# Patient Record
Sex: Female | Born: 1975 | Race: White | Hispanic: No | Marital: Married | Smoking: Former smoker
Health system: Southern US, Community
[De-identification: ages and names within clinical notes are randomized; demographics above are authoritative.]

## PROBLEM LIST (undated history)

## (undated) DIAGNOSIS — F32A Depression, unspecified: Secondary | ICD-10-CM

## (undated) DIAGNOSIS — F319 Bipolar disorder, unspecified: Secondary | ICD-10-CM

## (undated) DIAGNOSIS — B1920 Unspecified viral hepatitis C without hepatic coma: Secondary | ICD-10-CM

## (undated) DIAGNOSIS — R87619 Unspecified abnormal cytological findings in specimens from cervix uteri: Secondary | ICD-10-CM

## (undated) DIAGNOSIS — N83209 Unspecified ovarian cyst, unspecified side: Secondary | ICD-10-CM

## (undated) DIAGNOSIS — R569 Unspecified convulsions: Secondary | ICD-10-CM

## (undated) DIAGNOSIS — F419 Anxiety disorder, unspecified: Secondary | ICD-10-CM

## (undated) DIAGNOSIS — E0590 Thyrotoxicosis, unspecified without thyrotoxic crisis or storm: Secondary | ICD-10-CM

## (undated) DIAGNOSIS — J45909 Unspecified asthma, uncomplicated: Secondary | ICD-10-CM

## (undated) DIAGNOSIS — IMO0002 Reserved for concepts with insufficient information to code with codable children: Secondary | ICD-10-CM

## (undated) DIAGNOSIS — F329 Major depressive disorder, single episode, unspecified: Secondary | ICD-10-CM

## (undated) HISTORY — DX: Thyrotoxicosis, unspecified without thyrotoxic crisis or storm: E05.90

## (undated) HISTORY — DX: Reserved for concepts with insufficient information to code with codable children: IMO0002

## (undated) HISTORY — DX: Unspecified abnormal cytological findings in specimens from cervix uteri: R87.619

## (undated) HISTORY — DX: Unspecified asthma, uncomplicated: J45.909

## (undated) HISTORY — DX: Unspecified convulsions: R56.9

## (undated) HISTORY — PX: APPENDECTOMY: SHX54

## (undated) HISTORY — DX: Unspecified ovarian cyst, unspecified side: N83.209

## (undated) HISTORY — PX: ABDOMINAL SURGERY: SHX537

## (undated) HISTORY — PX: TUBAL LIGATION: SHX77

---

## 2001-04-07 ENCOUNTER — Emergency Department (HOSPITAL_COMMUNITY): Admission: EM | Admit: 2001-04-07 | Discharge: 2001-04-07 | Payer: Self-pay | Admitting: Internal Medicine

## 2002-03-16 ENCOUNTER — Emergency Department (HOSPITAL_COMMUNITY): Admission: EM | Admit: 2002-03-16 | Discharge: 2002-03-16 | Payer: Self-pay | Admitting: *Deleted

## 2002-03-16 ENCOUNTER — Encounter: Payer: Self-pay | Admitting: *Deleted

## 2003-02-13 ENCOUNTER — Emergency Department (HOSPITAL_COMMUNITY): Admission: EM | Admit: 2003-02-13 | Discharge: 2003-02-13 | Payer: Self-pay | Admitting: Emergency Medicine

## 2003-04-20 ENCOUNTER — Emergency Department (HOSPITAL_COMMUNITY): Admission: EM | Admit: 2003-04-20 | Discharge: 2003-04-20 | Payer: Self-pay | Admitting: Emergency Medicine

## 2003-04-22 ENCOUNTER — Inpatient Hospital Stay (HOSPITAL_COMMUNITY): Admission: EM | Admit: 2003-04-22 | Discharge: 2003-04-24 | Payer: Self-pay | Admitting: *Deleted

## 2003-10-05 ENCOUNTER — Emergency Department (HOSPITAL_COMMUNITY): Admission: EM | Admit: 2003-10-05 | Discharge: 2003-10-05 | Payer: Self-pay | Admitting: Emergency Medicine

## 2003-10-23 ENCOUNTER — Emergency Department (HOSPITAL_COMMUNITY): Admission: EM | Admit: 2003-10-23 | Discharge: 2003-10-23 | Payer: Self-pay

## 2003-11-07 ENCOUNTER — Emergency Department (HOSPITAL_COMMUNITY): Admission: EM | Admit: 2003-11-07 | Discharge: 2003-11-07 | Payer: Self-pay | Admitting: Emergency Medicine

## 2004-04-10 ENCOUNTER — Emergency Department (HOSPITAL_COMMUNITY): Admission: EM | Admit: 2004-04-10 | Discharge: 2004-04-10 | Payer: Self-pay | Admitting: Emergency Medicine

## 2004-10-30 ENCOUNTER — Emergency Department (HOSPITAL_COMMUNITY): Admission: EM | Admit: 2004-10-30 | Discharge: 2004-10-30 | Payer: Self-pay | Admitting: Emergency Medicine

## 2006-03-27 ENCOUNTER — Emergency Department (HOSPITAL_COMMUNITY): Admission: EM | Admit: 2006-03-27 | Discharge: 2006-03-27 | Payer: Self-pay | Admitting: Emergency Medicine

## 2006-07-30 ENCOUNTER — Emergency Department (HOSPITAL_COMMUNITY): Admission: EM | Admit: 2006-07-30 | Discharge: 2006-07-30 | Payer: Self-pay | Admitting: Emergency Medicine

## 2006-09-12 ENCOUNTER — Emergency Department (HOSPITAL_COMMUNITY): Admission: EM | Admit: 2006-09-12 | Discharge: 2006-09-12 | Payer: Self-pay | Admitting: Emergency Medicine

## 2006-10-25 ENCOUNTER — Emergency Department (HOSPITAL_COMMUNITY): Admission: EM | Admit: 2006-10-25 | Discharge: 2006-10-25 | Payer: Self-pay | Admitting: Emergency Medicine

## 2007-01-14 ENCOUNTER — Emergency Department (HOSPITAL_COMMUNITY): Admission: EM | Admit: 2007-01-14 | Discharge: 2007-01-14 | Payer: Self-pay | Admitting: Emergency Medicine

## 2007-11-08 ENCOUNTER — Emergency Department (HOSPITAL_COMMUNITY): Admission: EM | Admit: 2007-11-08 | Discharge: 2007-11-08 | Payer: Self-pay | Admitting: Emergency Medicine

## 2008-05-23 ENCOUNTER — Ambulatory Visit: Payer: Self-pay | Admitting: Psychiatry

## 2008-05-23 ENCOUNTER — Emergency Department (HOSPITAL_COMMUNITY): Admission: EM | Admit: 2008-05-23 | Discharge: 2008-05-24 | Payer: Self-pay | Admitting: Emergency Medicine

## 2008-05-24 ENCOUNTER — Inpatient Hospital Stay (HOSPITAL_COMMUNITY): Admission: RE | Admit: 2008-05-24 | Discharge: 2008-05-29 | Payer: Self-pay | Admitting: Psychiatry

## 2010-06-27 LAB — DIFFERENTIAL
Basophils Absolute: 0 10*3/uL (ref 0.0–0.1)
Eosinophils Relative: 3 % (ref 0–5)
Lymphocytes Relative: 33 % (ref 12–46)
Lymphs Abs: 3.2 10*3/uL (ref 0.7–4.0)
Monocytes Absolute: 0.4 10*3/uL (ref 0.1–1.0)
Neutro Abs: 6 10*3/uL (ref 1.7–7.7)

## 2010-06-27 LAB — CBC
HCT: 41.7 % (ref 36.0–46.0)
Hemoglobin: 14.4 g/dL (ref 12.0–15.0)
RBC: 4.3 MIL/uL (ref 3.87–5.11)
RDW: 13 % (ref 11.5–15.5)
WBC: 9.9 10*3/uL (ref 4.0–10.5)

## 2010-06-27 LAB — RAPID URINE DRUG SCREEN, HOSP PERFORMED
Amphetamines: NOT DETECTED
Barbiturates: NOT DETECTED
Benzodiazepines: POSITIVE — AB
Cocaine: NOT DETECTED

## 2010-06-27 LAB — BASIC METABOLIC PANEL
CO2: 28 mEq/L (ref 19–32)
Calcium: 8.9 mg/dL (ref 8.4–10.5)
Creatinine, Ser: 0.82 mg/dL (ref 0.4–1.2)
GFR calc Af Amer: >60 mL/min (ref >60)
GFR calc non Af Amer: >60 mL/min (ref >60)
Glucose, Bld: 134 mg/dL — ABNORMAL HIGH (ref 70–99)

## 2010-06-27 LAB — ETHANOL: Alcohol, Ethyl (B): <5 mg/dL (ref 0–10)

## 2010-07-30 NOTE — H&P (Signed)
NAME:  Barbara Simon, Barbara Simon                ACCOUNT NO.:  [id number]   MEDICAL RECORD NO.:  [id number]          PATIENT TYPE:  IPS   LOCATION:  0503                          FACILITY:  [facility]   PHYSICIAN:  Geoffery Lyons, M.D.      DATE OF BIRTH:  [birth year]   DATE OF ADMISSION:  05/24/2008  DATE OF DISCHARGE:                       PSYCHIATRIC ADMISSION ASSESSMENT   HISTORY:  A 35 year old single female voluntarily admitted on May 23, 2008.  The patient states that she is here to get her mind straight,  having problems and anxiety attacks, depression and suicidal thoughts  with a plan to overdose.  Feels that she is a burden to her family.  She  is unemployed.  Has been off her medications for some period time and is  here to get some help.   PAST PSYCHIATRIC HISTORY:  The patient was here in 2005 for a self-  inflicted injury.  She has a history bipolar disorder.   SOCIAL HISTORY:  A 35 year old female.  She lives in [place]  and lives with her mother.  She does have 3 children, but she states  they do not reside with her.  She did not disclose any other further  details.  She is unemployed.  Per chart records, states the patient is  on probation for breaking and entering charges.   FAMILY HISTORY:  None known.   ALCOHOL/DRUG HISTORY:  The patient reports that she has a history of  being addicted to opiates.  Has been clean from opiates for 6 months.  Does state though that she has been buying Xanax off the street to cope  with her anxiety.   PRIMARY CARE Odus Clasby:  None.   MEDICAL PROBLEMS:  Hypertension.   MEDICATIONS:  1. Cymbalta 60 mg daily.  2. Abilify 5 mg daily.  3. Tramadol p.r.n. for back pain.  4. Lyrica, unclear dose.  5. Xanax.  Again, patient has been noncompliant with her medications.   DRUG ALLERGIES:  NEURONTIN, but states she had a problem where she was  bleeding rectally.   PHYSICAL EXAMINATION:  GENERAL:  This is an overweight young female who  was  fully assessed at [facility].  Physical exam was reviewed.  No significant findings.  She did receive Toradol 60 and a nicotine  patch.  VITAL SIGNS:  Temperature of 97.3, 100 heart rate, 18 respirations,  blood pressure is 124/69, 171 pounds, approximately 5 feet 6 inches  tall.   LABORATORY DATA:  Alcohol level less than 5.  CBC within normal limits.  Urine drug screen is positive for benzodiazepines.   MENTAL STATUS EXAM:  The patient is fully alert.  When asked to  interview, she states does she have a choice about speaking with me.  She did cooperate.  Laid down in her bed.  No eye contact.  She is  casually dressed.  Her speech is clear, normal pace and tone.  She is  irritable, unhappy with her psychiatrist.  She is wanting to get Xanax,  she states that is the only thing that helps her out.  Thought  processes  are coherent and goal directed.  No evidence of any delusional  statements.  Does not appear to be responding to internal stimuli.  Cognitive function intact.  Her memory appears good.  Judgment and  insight are fair.   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  AXIS II:  Deferred.  AXIS III:  Back pain.  AXIS IV:  Problems with occupation, housing issues, other psychosocial  problems.  AXIS V:  Current is 35-40.   PLAN:  Our plan is to resume her Cymbalta.  Have Seroquel available on a  p.r.n. basis as well as Vistaril for anxiety.  The patient will be in  the dual diagnosis program.  We will reinforce medication compliance,  followup and coping skills.  We offered a family session.  Her tentative  length of stay at this time is 3-4 days.      Landry Corporal, N.P.      Geoffery Lyons, M.D.  Electronically Signed    JO/MEDQ  D:  05/25/2008  T:  05/25/2008  Job:  [id number]

## 2010-08-02 NOTE — Discharge Summary (Signed)
NAME:  Barbara Simon, Barbara Simon                ACCOUNT NO.:  [id number]   MEDICAL RECORD NO.:  [id number]          PATIENT TYPE:  IPS   LOCATION:  0503                          FACILITY:  [facility]   PHYSICIAN:  Geoffery Lyons, M.D.      DATE OF BIRTH:  [birth year]   DATE OF ADMISSION:  05/24/2008  DATE OF DISCHARGE:  05/29/2008                               DISCHARGE SUMMARY   CHIEF COMPLAINT AND HISTORY OF PRESENT ILLNESS:  This was the second  admission to [facility] for this 35 year old  single female voluntarily admitted March 9.  She endorsed that she was  in the unit to get her mind straight, having problems with anxiety  attacks, depression, thoughts of suicide with a plan to overdose.  Feeling that she was a burden to her family.  She is unemployed.  Has  been off her medication for some period of time and was here to get some  help.  She lost her job in December of 2009.  Unable to find a job.  Living with mom.  Feels like she is a burden.  She went to [facility] and  voiced some thoughts of suicide and she was sent to the emergency room.   PAST PSYCHIATRIC HISTORY:  Has been diagnosed with bipolar.  Has not  taken medications since November of 2008.  She was admitted to  [facility] in 2005 for self-inflicted injury.   ALCOHOL AND DRUG HISTORY:  Endorsed a history of being addicted to  opiates.  Has been abstinent for 6 months.  Has been buying Xanax off  the street to cope with her anxiety as she claims.   MEDICAL HISTORY:  Hypertension.   MEDICATIONS:  1. Cymbalta 60 mg per day.  2. Abilify 5 mg per day.  3. Tramadol as needed for back pain.  4. Lyrica unclear dose.  5. Xanax.  Noncompliant with medications.  Hard to know how long she      had been without them.   PHYSICAL EXAMINATION:  Failed to show any acute findings.   LABORATORY WORKUP:  Alcohol level less than 5.  CBC within normal  limits.  UDS positive for benzodiazepines.  Exam reveals an  alert,  cooperative female in bed.  No eye contact.  Speech was clear, normal in  rate, tempo and production.  Irritable, unhappy with her psychiatrist.  Wanting to get Xanax only thing that helps me.  Thought processes  logical, coherent and relevant.  No evidence of delusions.  Claims  thoughts of suicide.  No active plan.  No intent.  Cognition well-  preserved.   DIAGNOSES:  AXIS I:  Mood disorder not otherwise specified.  Benzodiazepine abuse.  AXIS II:  No diagnosis.  AXIS III:  Back pain.  AXIS IV:  Moderate.  AXIS V:  Upon admission 35, highest GAF in the last year 60.   COURSE IN THE HOSPITAL:  She was admitted, started in individual and  group psychotherapy.  Claims she was crazy in her mind, cannot take it  anymore, life in general, cannot be  happy, decreased energy, decreased  motivation.  Only things about all the things that are ruining her life,  worries, ruminates, catastrophizes.  On probation for breaking and  entering in 2008.  Diagnosed bipolar, depressed, also with anxiety.  Has  three children.  Never good at dealing with life other than when she was  35 years old got negative attention, trouble in school, fighting all the  time.  Had been on Paxil and Wellbutrin.  About 5-1/2 years ago became  addicted to opiates, was taking Xanax 1 mg four times a day.  Claims she  was __________ ill to depressed.  Had been on Paxil, Wellbutrin,  Cymbalta, Abilify, Lyrica, tramadol and Seroquel.  Very upset because  she wanted Xanax and we explained why it was not a good idea.  She was  given some Ativan as needed short-term.  She was placed on Seroquel,  Cymbalta and Lunesta.  The only thing that helps her sleep is Lunesta.  We increased Cymbalta to 60.  We increased the Seroquel.  March 12 more  settled.  Admits that her life feels out of control.  Admits she knows  she needs to start from scratch but states she really does not know how  to do it and feeling very overwhelmed.   Endorsed that just filling  application out for work she would become shaky, feeling out of control  with increased heart rate.  March 13 she was pretty down as the sister  did not come after she said she was coming.  Dealing with the stressors.  Feeling alone.  She asked for Abilify but she was told that if she was  on Abilify she could not be on the Seroquel and liked the Seroquel  better.  March 15 she felt ready to be discharged.  Endorsed no active  suicidal or homicidal ideas.  No hallucinations or delusions.  Feeling  better.  She understood that she really needed to follow up on an  outpatient basis, to continue to work on the issues in order to identify  a lot of her negative thinking process and start addressing some of the  distortions with CBT techniques.  She was not suicidal or homicidal and  felt that she could continue her medication this time around and do  better.   DISCHARGE DIAGNOSES:  AXIS I:  Mood disorder not otherwise specified,  benzodiazepine abuse, opiate dependence in remission, cocaine abuse in  remission.  AXIS II:  No diagnosis.  AXIS III:  No diagnosis.  AXIS IV:  Moderate.  AXIS V:  On discharge 50-55.   DISCHARGE MEDICATIONS:  1. Discharged on Cymbalta 60 mg per day.  2. Seroquel 300 one-half four times a day.  3. Lunesta 30 mg at bedtime.   FOLLOWUP:  Follow up at [facility].      Geoffery Lyons, M.D.  Electronically Signed     IL/MEDQ  D:  06/21/2008  T:  06/21/2008  Job:  [id number]

## 2010-08-02 NOTE — H&P (Signed)
NAME:  Barbara Simon, Barbara Simon                            ACCOUNT NO.:  [id number]   MEDICAL RECORD NO.:  [id number]                   PATIENT TYPE:  IPS   LOCATION:  0301                                 FACILITY:  [facility]   PHYSICIAN:  Vic Ripper, P.A.-C.         DATE OF BIRTH:  1975-06-29   DATE OF ADMISSION:  04/22/2003  DATE OF DISCHARGE:                         PSYCHIATRIC ADMISSION ASSESSMENT   IDENTIFYING INFORMATION:  This is an involuntary admission.  This is a 35-  year-old single white female.  She is admitted to the services of Dr.  Milford Cage.  Identifying information comes from the patient and  accompanying records.  Apparently the patient cut on her wrist Wednesday.  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  Her mother finally tracked her down, brought her to the emergency room for  medical clearance.  The patient continued to endorse suicidal ideation with  no safety plan and hence it was felt that she needed to be committed.  The  patient is worried about her 3 children.  She says her mother has to work 2  jobs as an Lobbyist to support her 16 year old sister.  The  patient is currently homeless.  She has been staying in a hotel with her 3  children ages 55, 33 and 1-1/2. She is currently having relationship issues  with her current boyfriend who is the father of her youngest child.  She has  had no prior issues with the law.  In the emergency room, her CBC, her  urinalysis were free for infection or immediate issues.  Her urine drug  screen however was positive for cocaine.   The patient states that recently she has been having problems sleeping.  She  has no energy, a decreased appetite.  She may have lost some weight  recently, although it is not apparent from her physical examination, and she  is very irritable.   PAST PSYCHIATRIC HISTORY:  Ten years ago she was seen at [facility].  She does not  remember at this time what was going on then  and she cannot remember if in fact she was prescribed medication at that  time or not.   SOCIAL HISTORY:  She does have  GED.  She married at age 58.  She was  divorced at age 89.  She has 3 children, her oldest, an 19-year-old, is a  girl, her middle child is her son.  He is 6.  Those 2 children have the same  father, that is who she was married to, and her youngest son, age 42-1/2, was  fathered by her current boyfriend.   FAMILY HISTORY:  She states her mom has depression.  She is not sure whether  her mom is still prescribed medication or not..   ALCOHOL AND DRUG ABUSE:  She states she smokes 1 to 1-1/2 packs a  day for  the past 12 years.  She denies alcohol.  Cocaine is relatively new and she  has only used a few times.   PAST MEDICAL HISTORY:  Primary care Karagan Lehr:  She states she does not have  one.  She has no known medical illness.  Current medications are none.   ALLERGIES:  None.   POSITIVE PHYSICAL FINDINGS:  PHYSICAL EXAMINATION:  Reveals a short,  slightly overweight white female who is quite irritable, with gunky eyes.  HEENT:  Reveals that she still has her contacts in.  Her eyes are matted.  She states that this is normal for her, that it is not indicative of an  infection or anything.  Remainder of her HEENT exam was within normal  limits.  Interestingly enough, her tongue is pierced.  LUNGS:  Clear to auscultation.  HEART:  Regular rate and rhythm without murmurs, rubs or gallops.  ABDOMEN:  Soft, it revealed no tenderness to palpation, no organomegaly.  Bowel sounds were positive and present in all 4 quadrants.  She does have a  scar status post appendectomy.  MUSCULOSKELETAL EXAM: Reveals no clubbing, cyanosis, or edema.  NEUROLOGICALLY:  Cranial nerves II-XII are grossly intact.   MENTAL STATUS EXAM:  She is alert albeit irritable.  Her appearance is  somewhat disheveled.  She does have superficial, self-inflicted  lacerations  on her right arm.  Her behavior becomes cooperative.  Her speech is not  pressured.  There is no psychotic content.  Her mood is somewhat depressed  and anxious.  Her affect is irritable.  Her thought processes reveal no  delusions or paranoia.  Concentration and memory are intact.  Judgment and  insight are fair.  Intelligence is average.   ADMISSION DIAGNOSES:   AXIS I:  1. Adjustment disorder with depressed mood secondary to situation.  2. Cocaine abuse.  3. Tobacco abuse.   AXIS II:  Rule out personality disorder.   AXIS III:  No known medical problems.   AXIS IV:  Severe, problems with primary support group, problems related to  social environment, occupational problems, housing problem, economic  problem, and problems related to the legal system.  She cannot seem to get  child support from her older 2 children's father.   AXIS V:  Global assessment of function is 40.   PLAN:  Admit for crisis stabilization and support through acute withdrawal  from cocaine, to start an antidepressant, and to have case management look  into her situation to see if there is any social support available out in  the community.                                               Vic Ripper, P.A.-C.    MD/MEDQ  D:  04/22/2003  T:  04/22/2003  Job:  [id number]

## 2010-08-02 NOTE — Discharge Summary (Signed)
NAME:  Barbara Simon, Barbara Simon NO.:  [id number]   MEDICAL RECORD NO.:  [id number]                   PATIENT TYPE:  IPS   LOCATION:  0301                                 FACILITY:  [facility]   PHYSICIAN:  Jeanice Lim, M.D.              DATE OF BIRTH:  06-01-1975   DATE OF ADMISSION:  04/22/2003  DATE OF DISCHARGE:  04/24/2003                                 DISCHARGE SUMMARY   IDENTIFYING DATA:  This is a 35 year old single Caucasian female admitted to  services of Jasmine Pang, M.D. 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  Mother finally  tracked her down, brought her to the emergency room for medical clearance.  The patient continued to endorse suicide ideation with no plan.  The patient  was worried about her 3 children and did not want to come in voluntarily.  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  The patient was currently homeless.  Urine  drug screen was positive for cocaine.  The patient was quite irritable.   MEDICATIONS:  None.   ALLERGIES:  None.   PHYSICAL EXAMINATION:  Within normal limits.  NEURO:  Nonfocal.   ROUTINE ADMITTED LABORATORY STUDIES:  Essentially within normal limits.   The patient's cocaine use apparently had been relatively recent, and she had  used just a few times, but apparently was out of control already.   MENTAL STATUS EXAM:  Alert, but irritable, somewhat disheveled, superficial  self-inflicted laceration to right arm, cooperative.  Speech not pressured.  No psychotic symptoms.  Mood somewhat depressed, anxious, and angry about  being hospitalized.  Thought processes goal-directed.  There was no  psychotic symptoms or acute dangerous ideation.  Cognition intact.  Judgment  and insight fair to poor.   ADMISSION DIAGNOSIS:   AXIS I:  1. Adjustment disorder with depressed mood, secondary to  situation.  2. Rule out a substance induced mood disorder.  3. Rule out cocaine abuse.  4. Tobacco abuse.   AXIS II:  Rule out personality disorder.   AXIS III:  None.   AXIS IV:  Severe problems related to primary support group, which is  limited.  Social, environment, occupational problems, housing problems, and  economic problems, and problems related to the legal system.   AXIS V:  35/65.   The patient was admitted, ordered routine and p.r.n. medications, underwent  further monitoring, was encouraged to participate in individual, group, and  milieu therapies.  The patient received crisis intervention regarding  suicide ideation with safety monitoring.  Participated in substance abuse  treatment and was monitored regarding withdrawal symptoms.  The patient  required Seroquel to restore sleep.  The patient notified DSS social worker  of a followup plan and was willing to participate in a  substance abuse  treatment program and follow up with a psychiatrist and therapist.  The  patient was appropriate on the unit and had no dangerous ideation.  Mood  improved.  Reported regarding cutting on her wrist.  Reported having only  used cocaine a couple of times.  The patient participated in aftercare  planning and was discharged in improved condition.  No dangerous ideation or  psychotic symptoms.  Motivated to receive substance abuse followup.  Discharged after medication education on:  1. Wellbutrin XL 300 mg q. a.m.  2. Seroquel 25 mg 1-4 at 9:00 p.r.n. insomnia.   The patient is to follow up at [facility]  Thursday, April 27, 2003.   DISCHARGE DIAGNOSES:   AXIS I:  1. Adjustment disorder with depressed mood, secondary to situation.  2. Rule out a substance induced mood disorder.  3. Rule out cocaine abuse.  4. Tobacco abuse.   AXIS II:  Rule out personality disorder.   AXIS III:  None.   AXIS IV:  Severe problems related to primary support  group, which is  limited.  Social, environment, occupational problems, housing problems, and  economic problems, and problems related to the legal system.   AXIS V:  Global assessment of functioning on discharge 55-60.                                               Jeanice Lim, M.D.    Barbara Simon  D:  05/14/2003  T:  05/15/2003  Job:  [id number]

## 2011-09-02 ENCOUNTER — Encounter (HOSPITAL_COMMUNITY): Payer: Self-pay | Admitting: Emergency Medicine

## 2011-09-02 ENCOUNTER — Emergency Department (HOSPITAL_COMMUNITY)
Admission: EM | Admit: 2011-09-02 | Discharge: 2011-09-03 | Disposition: A | Discharge status: Home / Self Care | Payer: Self-pay | Attending: Emergency Medicine | Admitting: Emergency Medicine

## 2011-09-02 DIAGNOSIS — F17200 Nicotine dependence, unspecified, uncomplicated: Secondary | ICD-10-CM | POA: Insufficient documentation

## 2011-09-02 DIAGNOSIS — M5430 Sciatica, unspecified side: Secondary | ICD-10-CM | POA: Insufficient documentation

## 2011-09-02 HISTORY — DX: Depression, unspecified: F32.A

## 2011-09-02 HISTORY — DX: Bipolar disorder, unspecified: F31.9

## 2011-09-02 HISTORY — DX: Anxiety disorder, unspecified: F41.9

## 2011-09-02 HISTORY — DX: Major depressive disorder, single episode, unspecified: F32.9

## 2011-09-02 MED ORDER — ONDANSETRON 8 MG PO TBDP
8.0000 mg | ORAL_TABLET | Freq: Once | ORAL | Status: AC
  Administered 2011-09-03: 8 mg via ORAL
  Filled 2011-09-02: qty 1

## 2011-09-02 MED ORDER — HYDROMORPHONE HCL PF 1 MG/ML IJ SOLN
1.0000 mg | Freq: Once | INTRAMUSCULAR | Status: AC
  Administered 2011-09-03: 1 mg via INTRAMUSCULAR
  Filled 2011-09-02: qty 1

## 2011-09-02 NOTE — ED Notes (Signed)
Patient complaining of right-sided lower back pain radiating into right hip with right leg numbness. Denies back injury or history.

## 2011-09-03 ENCOUNTER — Emergency Department (HOSPITAL_COMMUNITY): Payer: Self-pay

## 2011-09-03 MED ORDER — OXYCODONE-ACETAMINOPHEN 5-325 MG PO TABS: 1.0000 | ORAL_TABLET | ORAL | Status: AC | PRN

## 2011-09-03 MED ORDER — CYCLOBENZAPRINE HCL 10 MG PO TABS: 10.0000 mg | ORAL_TABLET | Freq: Three times a day (TID) | ORAL | Status: AC | PRN

## 2011-09-03 MED ORDER — OXYCODONE-ACETAMINOPHEN 5-325 MG PO TABS
1.0000 | ORAL_TABLET | Freq: Once | ORAL | Status: AC
  Administered 2011-09-03: 1 via ORAL
  Filled 2011-09-03: qty 1

## 2011-09-03 MED ORDER — PREDNISONE 10 MG PO TABS: ORAL_TABLET | ORAL | Status: DC

## 2011-09-03 NOTE — Discharge Instructions (Signed)
Sciatica  Sciatica is a condition often seen in patients with disk disease of the lower back. Pressure on the sciatica nerve causes pain to radiate from the lower back or buttock down the leg.   CAUSES   It results from pressure on nerve roots coming out of the spine. This is often the result of a disc that deteriorates and pushes to one side. Often there is a history of back problems.   TREATMENT   In most cases sciatica improves greatly with conservative treatment (treatment that does not involve surgery). Most patients are completely better after 2-4 weeks of bed rest and other supportive care. Bed rest reduces the disc pressure greatly. Sitting is the worst position. When sitting pressure on the disc is over 5 times greater than it is while lying down. Avoid:   Bending.   Lifting.   All other activities which make the problem worse.  After the pain improves, you may continue with normal activity. Take brief periods for bed rest throughout the day until you are back to normal.  Only take over-the-counter or prescription medicines for pain, discomfort, or fever as directed by your caregiver. Muscle relaxants may help by relieving spasm and providing mild sedation. Cold or heat therapy and massage may also give significant relief. Spinal manipulation is not recommended because it can increase the degree of disc protrusion. Traction can be used in severe cases. Surgery is reserved for patients who:   Do not improve within the first months of conservative treatment.   Have signs of severe nerve root pressure.  See your doctor for follow up care as recommended. A program for back injury rehabilitation with stretching and strengthening exercises is an important part of healing.   SEEK MEDICAL CARE IF:    You notice increased pain.   You notice weakness.   You have numbness in your legs.   You have any difficulty with bladder or bowel control.  Document Released: 04/10/2004 Document Revised: 02/20/2011 Document  Reviewed: 03/03/2005  [employer] Patient Information 2012 [employer].

## 2011-09-07 NOTE — ED Provider Notes (Signed)
History     CSN: [id number]  Arrival date & time 09/02/11  2016   First MD Initiated Contact with Patient 09/02/11 2313      Chief Complaint  Patient presents with  . Back Pain    (Consider location/radiation/quality/duration/timing/severity/associated sxs/prior treatment) HPI Comments: Patient c/o low back pain for several days.  States that she also has intermittent "numbness and tingling" sensations that radiate to her right thigh and knee.  She states this resolves with position change.  She denies incontinence, abd pain, perineal numbness or urinary symptoms.    Patient is a 36 y.o. female presenting with back pain. The history is provided by the patient.  Back Pain  This is a new problem. The current episode started more than 2 days ago. Episode frequency: intrmittently. The problem has been gradually worsening. The pain is associated with no known injury. The pain is present in the lumbar spine and sacro-iliac joint. The quality of the pain is described as aching and burning. The pain radiates to the right thigh and right knee. The pain is moderate. The symptoms are aggravated by twisting, bending and certain positions. Associated symptoms include numbness, leg pain and tingling. Pertinent negatives include no chest pain, no fever, no abdominal pain, no abdominal swelling, no bowel incontinence, no perianal numbness, no bladder incontinence, no dysuria, no pelvic pain, no paresthesias, no paresis and no weakness. She has tried nothing for the symptoms. The treatment provided no relief.    Past Medical History  Diagnosis Date  . Depression   . Bipolar 1 disorder   . Anxiety     Past Surgical History  Procedure Date  . Tubal ligation     History reviewed. No pertinent family history.  History  Substance Use Topics  . Smoking status: Current Everyday Smoker -- 1.0 packs/day  . Smokeless tobacco: Not on file  . Alcohol Use: No    OB History    Grav Para Term Preterm  Abortions TAB SAB Ect Mult Living                  Review of Systems  Constitutional: Negative for fever.  Respiratory: Negative for shortness of breath.   Cardiovascular: Negative for chest pain.  Gastrointestinal: Negative for vomiting, abdominal pain, constipation and bowel incontinence.  Genitourinary: Negative for bladder incontinence, dysuria, hematuria, flank pain, decreased urine volume, difficulty urinating and pelvic pain.       Perineal numbness or incontinence of urine or feces  Musculoskeletal: Positive for back pain. Negative for joint swelling.  Skin: Negative for rash.  Neurological: Positive for tingling and numbness. Negative for weakness and paresthesias.  All other systems reviewed and are negative.    Allergies  Review of patient's allergies indicates no known allergies.  Home Medications   Current Outpatient Rx  Name Route Sig Dispense Refill  . CYCLOBENZAPRINE HCL 10 MG PO TABS Oral Take 1 tablet (10 mg total) by mouth 3 (three) times daily as needed for muscle spasms. 21 tablet 0  . OXYCODONE-ACETAMINOPHEN 5-325 MG PO TABS Oral Take 1 tablet by mouth every 4 (four) hours as needed for pain. 20 tablet 0  . PREDNISONE 10 MG PO TABS  Take 6 tablets day one, 5 tablets day two, 4 tablets day three, 3 tablets day four, 2 tablets day five, then 1 tablet day six 21 tablet 0    BP 128/84  Pulse 72  Temp 97.9 F (36.6 C) (Oral)  Resp 16  Ht 5\' 2"  (  1.575 m)  Wt 215 lb (97.523 kg)  BMI 39.32 kg/m2  SpO2 100%  LMP 08/02/2011  Physical Exam  Nursing note and vitals reviewed. Constitutional: She is oriented to person, place, and time. She appears well-developed and well-nourished. No distress.  HENT:  Head: Normocephalic and atraumatic.  Neck: Normal range of motion. Neck supple.  Cardiovascular: Normal rate, regular rhythm and intact distal pulses.   No murmur heard. Pulmonary/Chest: Effort normal and breath sounds normal.  Musculoskeletal: She exhibits  tenderness. She exhibits no edema.       Lumbar back: She exhibits tenderness and pain. She exhibits normal range of motion, no swelling, no deformity, no laceration and normal pulse.       Back:  Neurological: She is alert and oriented to person, place, and time. No cranial nerve deficit or sensory deficit. She exhibits normal muscle tone. Coordination and gait normal.  Reflex Scores:      Patellar reflexes are 2+ on the right side and 2+ on the left side.      Achilles reflexes are 2+ on the right side and 2+ on the left side. Skin: Skin is warm and dry.    ED Course  Procedures (including critical care time)  Results for orders placed during the hospital encounter of 09/02/11  POCT PREGNANCY, URINE      Component Value Range   Preg Test, Ur NEGATIVE  NEGATIVE     Dg Lumbar Spine Complete  09/03/2011  *RADIOLOGY REPORT*  Clinical Data: Low back pain radiating to right hip.  Right leg numbness.  LUMBAR SPINE - COMPLETE 4+ VIEW  Comparison:  None.  Findings:  There is no evidence of lumbar spine fracture. Alignment is normal.  Intervertebral disc spaces are maintained.  IMPRESSION: Negative.  Original Report Authenticated By: Danae Orleans, M.D.      1. Sciatica       MDM      Patient has ttp of the lumbar paraspinal muscles and right SI joint space..  No focal neuro deficits on exam.  Ambulates with a steady gait.      Pt agrees to close f/u with her PMD  Patient / Family / Caregiver understand and agree with initial ED impression and plan with expectations set for ED visit. Pt stable in ED with no significant deterioration in condition. Pt feels improved after observation and/or treatment in ED.    Prescribed:  Flexeril Prednisone taper Percocet #20  Amulya Quintin L. [place], [place] 09/07/11 1921

## 2011-09-08 NOTE — ED Provider Notes (Signed)
Medical screening examination/treatment/procedure(s) were performed by non-physician practitioner and as supervising physician I was immediately available for consultation/collaboration.   Tashena Ibach, MD 09/08/11 1453 

## 2011-10-22 ENCOUNTER — Encounter (HOSPITAL_COMMUNITY): Payer: Self-pay | Admitting: *Deleted

## 2011-10-22 ENCOUNTER — Emergency Department (HOSPITAL_COMMUNITY)
Admission: EM | Admit: 2011-10-22 | Discharge: 2011-10-22 | Disposition: A | Discharge status: Home / Self Care | Payer: Self-pay | Attending: Emergency Medicine | Admitting: Emergency Medicine

## 2011-10-22 DIAGNOSIS — F411 Generalized anxiety disorder: Secondary | ICD-10-CM | POA: Insufficient documentation

## 2011-10-22 DIAGNOSIS — M5430 Sciatica, unspecified side: Secondary | ICD-10-CM | POA: Insufficient documentation

## 2011-10-22 DIAGNOSIS — F17200 Nicotine dependence, unspecified, uncomplicated: Secondary | ICD-10-CM | POA: Insufficient documentation

## 2011-10-22 DIAGNOSIS — F319 Bipolar disorder, unspecified: Secondary | ICD-10-CM | POA: Insufficient documentation

## 2011-10-22 MED ORDER — DEXAMETHASONE 4 MG PO TABS: ORAL_TABLET | ORAL | Status: DC

## 2011-10-22 MED ORDER — HYDROCODONE-ACETAMINOPHEN 5-325 MG PO TABS: ORAL_TABLET | ORAL | Status: DC

## 2011-10-22 MED ORDER — BACLOFEN 10 MG PO TABS: 10.0000 mg | ORAL_TABLET | Freq: Three times a day (TID) | ORAL | Status: AC

## 2011-10-22 NOTE — ED Notes (Signed)
Pt requesting medication before she is discharged.

## 2011-10-22 NOTE — ED Notes (Signed)
Discussed medications with pt and encouraged her not to take so many Goody Powders. Pt warned of the danger of stomach erosion and bleeding if she continues to take so many Goodys. Pt instructed not to use the powders while taking the steroid medications.

## 2011-10-22 NOTE — ED Notes (Signed)
Pt states lower back pain with shooting pain down right leg. Previously dx with sciatica. NAD.

## 2011-10-22 NOTE — ED Provider Notes (Signed)
History     CSN: [id number]  Arrival date & time 10/22/11  1531   First MD Initiated Contact with Patient 10/22/11 1728      Chief Complaint  Patient presents with  . Back Pain    (Consider location/radiation/quality/duration/timing/severity/associated sxs/prior treatment) Patient is a 36 y.o. female presenting with back pain. The history is provided by the patient.  Back Pain  This is a chronic problem. The problem occurs daily. The problem has been gradually worsening. Associated with: started a new job. more lifting and walking. The pain is present in the lumbar spine. The quality of the pain is described as shooting and aching. The pain radiates to the right thigh. The pain is severe. The symptoms are aggravated by bending, twisting and certain positions. The pain is the same all the time. Associated symptoms include tingling. Pertinent negatives include no chest pain, no abdominal pain, no bowel incontinence, no perianal numbness, no bladder incontinence and no dysuria. She has tried nothing for the symptoms. Risk factors include obesity.    Past Medical History  Diagnosis Date  . Depression   . Bipolar 1 disorder   . Anxiety     Past Surgical History  Procedure Date  . Tubal ligation     No family history on file.  History  Substance Use Topics  . Smoking status: Current Everyday Smoker -- 1.0 packs/day  . Smokeless tobacco: Not on file  . Alcohol Use: No    OB History    Grav Para Term Preterm Abortions TAB SAB Ect Mult Living                  Review of Systems  Constitutional: Negative for activity change.       All ROS Neg except as noted in HPI  HENT: Negative for nosebleeds and neck pain.   Eyes: Negative for photophobia and discharge.  Respiratory: Negative for cough, shortness of breath and wheezing.   Cardiovascular: Negative for chest pain and palpitations.  Gastrointestinal: Negative for abdominal pain, blood in stool and bowel incontinence.    Genitourinary: Negative for bladder incontinence, dysuria, frequency and hematuria.  Musculoskeletal: Positive for back pain. Negative for arthralgias.  Skin: Negative.   Neurological: Positive for tingling. Negative for dizziness, seizures and speech difficulty.  Psychiatric/Behavioral: Negative for hallucinations and confusion. The patient is nervous/anxious.        Depression    Allergies  Toradol  Home Medications   Current Outpatient Rx  Name Route Sig Dispense Refill  . PREDNISONE 10 MG PO TABS  Take 6 tablets day one, 5 tablets day two, 4 tablets day three, 3 tablets day four, 2 tablets day five, then 1 tablet day six 21 tablet 0    BP 118/74  Pulse 89  Temp 97.9 F (36.6 C) (Oral)  Resp 16  Ht 5\' 2"  (1.575 m)  SpO2 99%  LMP 10/22/2011  Physical Exam  Nursing note and vitals reviewed. Constitutional: She is oriented to person, place, and time. She appears well-developed and well-nourished.  Non-toxic appearance.  HENT:  Head: Normocephalic.  Right Ear: Tympanic membrane and external ear normal.  Left Ear: Tympanic membrane and external ear normal.  Eyes: EOM and lids are normal. Pupils are equal, round, and reactive to light.  Neck: Normal range of motion. Neck supple. Carotid bruit is not present.  Cardiovascular: Normal rate, regular rhythm, normal heart sounds, intact distal pulses and normal pulses.   Pulmonary/Chest: Breath sounds normal. No respiratory distress.  Abdominal: Soft. Bowel sounds are normal. There is no tenderness. There is no guarding.  Musculoskeletal: Normal range of motion.       Right straight leg raise causes severe pain of the lower back. There is good range of motion of the toes ankles and knees of both extremities. Dorsiflexion on the right causes pain in the lower back. There is no evidence of foot drop. Cannot assess strength because of pain during examination and patient cooperation. There is no noted atrophy appreciated.   Lymphadenopathy:       Head (right side): No submandibular adenopathy present.       Head (left side): No submandibular adenopathy present.    She has no cervical adenopathy.  Neurological: She is alert and oriented to person, place, and time. She has normal strength. No cranial nerve deficit or sensory deficit.       Patient complains of a slight decrease in sensory to touch on the right compared to the left lower extremity. Gait is slow but no foot drop.  Skin: Skin is warm and dry.  Psychiatric: Her speech is normal. Her mood appears anxious.    ED Course  Procedures (including critical care time)  Labs Reviewed - No data to display No results found.   No diagnosis found.    MDM  I have reviewed nursing notes, vital signs, and all appropriate lab and imaging results for this patient. Patient has what seems to be sciatica. She is advised to use heat to the lower back. She is advised to use dexamethasone daily, baclofen daily, and Norco every 4 hours if needed for pain. #20 tablets given.       Kathie Dike, [place] 10/22/11 [id number]

## 2011-10-23 NOTE — ED Provider Notes (Signed)
Medical screening examination/treatment/procedure(s) were performed by non-physician practitioner and as supervising physician I was immediately available for consultation/collaboration.   Tijuana Scheidegger M Deaven Urwin, DO 10/23/11 2051 

## 2013-01-20 ENCOUNTER — Encounter: Payer: Self-pay | Admitting: Obstetrics and Gynecology

## 2013-01-31 ENCOUNTER — Ambulatory Visit: Payer: Self-pay | Admitting: Endocrinology

## 2013-02-01 ENCOUNTER — Encounter: Payer: Self-pay | Admitting: Endocrinology

## 2013-02-01 ENCOUNTER — Ambulatory Visit (INDEPENDENT_AMBULATORY_CARE_PROVIDER_SITE_OTHER): Payer: Self-pay | Admitting: Endocrinology

## 2013-02-01 VITALS — BP 118/80 | HR 60 | Temp 97.4°F | Resp 16 | Ht 63.0 in | Wt 209.4 lb

## 2013-02-01 DIAGNOSIS — F329 Major depressive disorder, single episode, unspecified: Secondary | ICD-10-CM

## 2013-02-01 DIAGNOSIS — E0590 Thyrotoxicosis, unspecified without thyrotoxic crisis or storm: Secondary | ICD-10-CM

## 2013-02-01 DIAGNOSIS — F3289 Other specified depressive episodes: Secondary | ICD-10-CM

## 2013-02-01 NOTE — Patient Instructions (Addendum)
blood tests are being requested for you today.  We'll contact you with results. If it is overactive again, i would prescribe for you a pill to help the thyroid Please come back for a follow-up appointment in 3 weeks.

## 2013-02-01 NOTE — Progress Notes (Signed)
Subjective:    Patient ID: Barbara Simon, female    DOB: [birth year], 37 y.o.   MRN: [id number]  HPI Pt was recently noted on routine labs to have hyperthyroidism.  In retrospect, she reports few months of slight palpitations in the chest, and assoc anxiety.  She has had TL.   Past Medical History  Diagnosis Date  . Depression   . Bipolar 1 disorder   . Anxiety     Past Surgical History  Procedure Laterality Date  . Tubal ligation      History   Social History  . Marital Status: Divorced    Spouse Name: N/A    Number of Children: N/A  . Years of Education: N/A   Occupational History  . Not on file.   Social History Main Topics  . Smoking status: Current Every Day Smoker -- 1.00 packs/day  . Smokeless tobacco: Not on file  . Alcohol Use: No  . Drug Use: No  . Sexual Activity:    Other Topics Concern  . Not on file   Social History Narrative  . No narrative on file    Current Outpatient Prescriptions on File Prior to Visit  Medication Sig Dispense Refill  . ARIPiprazole (ABILIFY) 15 MG tablet Take 15 mg by mouth every morning.      . Aspirin-Salicylamide-Caffeine (BC HEADACHE) 325-95-16 MG TABS Take 2 packets by mouth every 3 (three) hours as needed. *PATIENT STATES she takes 2 packets by mouth 6 to 8 times daily as needed      . DULoxetine (CYMBALTA) 60 MG capsule Take 60 mg by mouth at bedtime.      . lamoTRIgine (LAMICTAL) 100 MG tablet Take 100 mg by mouth every morning.       No current facility-administered medications on file prior to visit.    Allergies  Allergen Reactions  . Toradol [Ketorolac Tromethamine] Hives and Itching  . Adhesive [Tape] Itching and Rash    No family history on file. Mother had i-131 rx for hyperthyroidism BP 118/80  Pulse 60  Temp(Src) 97.4 F (36.3 C) (Oral)  Resp 16  Ht 5\' 3"  (1.6 m)  Wt 209 lb 6.4 oz (94.983 kg)  BMI 37.10 kg/m2  LMP 01/27/2013   Review of Systems denies weight loss, hoarseness, double vision,  sob, diarrhea, myalgias, excessive diaphoresis, and numbness.  She has headache, dry mouth, easy bruising, rhinorrhea, depression, urinary frequency, chest pain, tremor, and anxiety.  She has regular menses.      Objective:   Physical Exam VS: see vs page GEN: no distress HEAD: head: no deformity eyes: no periorbital swelling, no proptosis external nose and ears are normal mouth: no lesion seen NECK: supple, thyroid is not enlarged CHEST WALL: no deformity LUNGS:  Clear to auscultation CV: reg rate and rhythm, no murmur ABD: abdomen is soft, nontender.  no hepatosplenomegaly.  not distended.  no hernia MUSCULOSKELETAL: muscle bulk and strength are grossly normal.  no obvious joint swelling.  gait is normal and steady EXTEMITIES: no deformity. no edema PULSES: no carotid bruit NEURO:  cn 2-12 grossly intact.   readily moves all 4's.  sensation is intact to touch on all 4's.  No tremor. SKIN:  Normal texture and temperature.  No rash or suspicious lesion is visible.   NODES:  None palpable at the neck PSYCH: alert, oriented x3.  Does not appear anxious nor depressed.   outside test results are reviewed: TSH=0.19  Today: Lab Results  Component Value Date  TSH 0.54 02/01/2013      Assessment & Plan:  Hyperthyroidism, mild, uncertain etiology, resolved.  However, she is at risk for recurrence. Anxiety/depression: not thyroid-related. Palpitations: not thyroid-related

## 2013-02-02 ENCOUNTER — Telehealth: Payer: Self-pay | Admitting: *Deleted

## 2013-02-02 ENCOUNTER — Encounter: Payer: Self-pay | Admitting: *Deleted

## 2013-02-02 NOTE — Telephone Encounter (Signed)
Message copied by Fayne Mediate on Wed Feb 02, 2013 10:06 AM ------      Message from: Romero Belling      Created: Wed Feb 02, 2013  7:21 AM       please call patient:      Your thyroid blood test is normal, but it could become abnormal again.      No treatment is needed now.      Please come back for a follow-up appointment in January. ------

## 2013-02-02 NOTE — Telephone Encounter (Signed)
Invalid number

## 2013-02-03 DIAGNOSIS — F329 Major depressive disorder, single episode, unspecified: Secondary | ICD-10-CM | POA: Insufficient documentation

## 2013-02-15 ENCOUNTER — Ambulatory Visit (INDEPENDENT_AMBULATORY_CARE_PROVIDER_SITE_OTHER): Payer: Self-pay | Admitting: Endocrinology

## 2013-02-15 VITALS — BP 118/70 | HR 78 | Temp 97.6°F | Wt 211.1 lb

## 2013-02-15 DIAGNOSIS — Z8619 Personal history of other infectious and parasitic diseases: Secondary | ICD-10-CM | POA: Insufficient documentation

## 2013-02-15 DIAGNOSIS — R002 Palpitations: Secondary | ICD-10-CM | POA: Insufficient documentation

## 2013-02-15 DIAGNOSIS — B182 Chronic viral hepatitis C: Secondary | ICD-10-CM

## 2013-02-15 NOTE — Progress Notes (Signed)
   Subjective:    Patient ID: Barbara Simon, female    DOB: Oct 22, 1975, 37 y.o.   MRN: [id number]  HPI Pt was dx'ed with hyperthyroidism in November of 2014, but repeat TFT were normal.  However, she continues to have palpitations, dry mouth, weight gain, tremor, excessive diaphoresis, headache, and insomnia.   Past Medical History  Diagnosis Date  . Depression   . Bipolar 1 disorder   . Anxiety     Past Surgical History  Procedure Laterality Date  . Tubal ligation      History   Social History  . Marital Status: Divorced    Spouse Name: N/A    Number of Children: N/A  . Years of Education: N/A   Occupational History  . Not on file.   Social History Main Topics  . Smoking status: Current Every Day Smoker -- 1.00 packs/day  . Smokeless tobacco: Not on file  . Alcohol Use: No  . Drug Use: No  . Sexual Activity:    Other Topics Concern  . Not on file   Social History Narrative  . No narrative on file    Current Outpatient Prescriptions on File Prior to Visit  Medication Sig Dispense Refill  . ARIPiprazole (ABILIFY) 15 MG tablet Take 15 mg by mouth every morning.      . Aspirin-Salicylamide-Caffeine (BC HEADACHE) 325-95-16 MG TABS Take 2 packets by mouth every 3 (three) hours as needed. *PATIENT STATES she takes 2 packets by mouth 6 to 8 times daily as needed      . diazepam (VALIUM) 5 MG tablet Take 5 mg by mouth every 6 (six) hours as needed for anxiety.      . DULoxetine (CYMBALTA) 60 MG capsule Take 60 mg by mouth at bedtime.      . lamoTRIgine (LAMICTAL) 100 MG tablet Take 100 mg by mouth every morning.      . traZODone (DESYREL) 150 MG tablet Take by mouth at bedtime. May take 1-2 qhs prn       No current facility-administered medications on file prior to visit.    Allergies  Allergen Reactions  . Toradol [Ketorolac Tromethamine] Hives and Itching  . Adhesive [Tape] Itching and Rash    No family history on file.  BP 118/70  Pulse 78  Temp(Src) 97.6  F (36.4 C) (Oral)  Wt 211 lb 2 oz (95.766 kg)  SpO2 98%  LMP 01/27/2013  Review of Systems She has severe anxiety.      Objective:   Physical Exam VITAL SIGNS:  See vs page GENERAL: no distress   Lab Results  Component Value Date   TSH 0.54 02/01/2013      Assessment & Plan:  Palpitations, and other sxs, not thyroid-related.

## 2013-02-15 NOTE — Patient Instructions (Signed)
Let's check a 24-HR urine, to look for excess "adrenaline."  Please bring this paper to the lab downstairs.  Please come back for a follow-up appointment in January.

## 2013-02-21 LAB — METANEPHRINES, URINE, 24 HOUR: Metaneph Total, Ur: 346 mcg/24 h (ref 115–695)

## 2013-02-22 LAB — CATECHOLAMINES, FRACTIONATED, URINE, 24 HOUR
Calculated Total (E+NE): 6 mcg/24 h — ABNORMAL LOW (ref 26–121)
Dopamine, 24 hr Urine: 162 mcg/24 h (ref 52–480)
Norepinephrine, 24 hr Ur: 6 mcg/24 h — ABNORMAL LOW (ref 15–100)
Total Volume - CF 24Hr U: 1000 mL

## 2013-02-24 ENCOUNTER — Encounter: Payer: Self-pay | Admitting: *Deleted

## 2013-02-28 ENCOUNTER — Ambulatory Visit (INDEPENDENT_AMBULATORY_CARE_PROVIDER_SITE_OTHER): Payer: Self-pay | Admitting: Obstetrics & Gynecology

## 2013-02-28 ENCOUNTER — Other Ambulatory Visit (HOSPITAL_COMMUNITY)
Admission: RE | Admit: 2013-02-28 | Discharge: 2013-02-28 | Disposition: A | Discharge status: Home / Self Care | Payer: Self-pay | Source: Ambulatory Visit | Attending: Obstetrics & Gynecology | Admitting: Obstetrics & Gynecology

## 2013-02-28 ENCOUNTER — Encounter: Payer: Self-pay | Admitting: Obstetrics & Gynecology

## 2013-02-28 VITALS — BP 110/80 | Ht 64.2 in | Wt 212.0 lb

## 2013-02-28 DIAGNOSIS — N870 Mild cervical dysplasia: Secondary | ICD-10-CM

## 2013-02-28 DIAGNOSIS — R87810 Cervical high risk human papillomavirus (HPV) DNA test positive: Secondary | ICD-10-CM | POA: Insufficient documentation

## 2013-02-28 DIAGNOSIS — Z01419 Encounter for gynecological examination (general) (routine) without abnormal findings: Secondary | ICD-10-CM | POA: Insufficient documentation

## 2013-02-28 DIAGNOSIS — Z1151 Encounter for screening for human papillomavirus (HPV): Secondary | ICD-10-CM | POA: Insufficient documentation

## 2013-02-28 NOTE — Addendum Note (Signed)
Addended by: Colen Darling on: 02/28/2013 04:54 PM   Modules accepted: Orders

## 2013-02-28 NOTE — Progress Notes (Signed)
Patient ID: Barbara Simon, female   DOB: 1975-03-20, 37 y.o.   MRN: [id number] Pt referred due to abnormal pap, 07/2010 LSIL  Here for follow up pap smear  Performed today and will follow up based on this pap finding  Past Medical History  Diagnosis Date  . Depression   . Bipolar 1 disorder   . Anxiety   . Abnormal Pap smear     LSIL  . Hepatitis     acute hepatitis C  . Thyrotoxicosis     Past Surgical History  Procedure Laterality Date  . Tubal ligation      OB History   Grav Para Term Preterm Abortions TAB SAB Ect Mult Living                  Allergies  Allergen Reactions  . Toradol [Ketorolac Tromethamine] Hives and Itching  . Adhesive [Tape] Itching and Rash    History   Social History  . Marital Status: Divorced    Spouse Name: N/A    Number of Children: N/A  . Years of Education: N/A   Social History Main Topics  . Smoking status: Current Every Day Smoker -- 1.00 packs/day  . Smokeless tobacco: None  . Alcohol Use: No  . Drug Use: No  . Sexual Activity: No   Other Topics Concern  . None   Social History Narrative  . None    Family History  Problem Relation Age of Onset  . Cancer Maternal Grandmother   . Cancer Maternal Aunt

## 2013-03-21 ENCOUNTER — Ambulatory Visit: Payer: Self-pay | Admitting: Endocrinology

## 2013-03-21 DIAGNOSIS — Z0289 Encounter for other administrative examinations: Secondary | ICD-10-CM

## 2013-03-22 ENCOUNTER — Telehealth: Payer: Self-pay | Admitting: *Deleted

## 2013-03-22 NOTE — Telephone Encounter (Signed)
Spoke with pt letting her know pap smear came back abnormal and she needs a colposcopy. Explained procedure. Pt voiced understanding. Call transferred to front desk to schedule appt. JSY

## 2013-03-28 ENCOUNTER — Emergency Department (HOSPITAL_COMMUNITY): Payer: Self-pay

## 2013-03-28 ENCOUNTER — Emergency Department (HOSPITAL_COMMUNITY)
Admission: EM | Admit: 2013-03-28 | Discharge: 2013-03-28 | Disposition: A | Discharge status: Home / Self Care | Payer: Self-pay | Attending: Emergency Medicine | Admitting: Emergency Medicine

## 2013-03-28 ENCOUNTER — Encounter (HOSPITAL_COMMUNITY): Payer: Self-pay | Admitting: Emergency Medicine

## 2013-03-28 DIAGNOSIS — F3130 Bipolar disorder, current episode depressed, mild or moderate severity, unspecified: Secondary | ICD-10-CM | POA: Insufficient documentation

## 2013-03-28 DIAGNOSIS — X500XXA Overexertion from strenuous movement or load, initial encounter: Secondary | ICD-10-CM | POA: Insufficient documentation

## 2013-03-28 DIAGNOSIS — F411 Generalized anxiety disorder: Secondary | ICD-10-CM | POA: Insufficient documentation

## 2013-03-28 DIAGNOSIS — S46909A Unspecified injury of unspecified muscle, fascia and tendon at shoulder and upper arm level, unspecified arm, initial encounter: Secondary | ICD-10-CM | POA: Insufficient documentation

## 2013-03-28 DIAGNOSIS — Z79899 Other long term (current) drug therapy: Secondary | ICD-10-CM | POA: Insufficient documentation

## 2013-03-28 DIAGNOSIS — Z8639 Personal history of other endocrine, nutritional and metabolic disease: Secondary | ICD-10-CM | POA: Insufficient documentation

## 2013-03-28 DIAGNOSIS — Y9389 Activity, other specified: Secondary | ICD-10-CM | POA: Insufficient documentation

## 2013-03-28 DIAGNOSIS — Z862 Personal history of diseases of the blood and blood-forming organs and certain disorders involving the immune mechanism: Secondary | ICD-10-CM | POA: Insufficient documentation

## 2013-03-28 DIAGNOSIS — M25511 Pain in right shoulder: Secondary | ICD-10-CM

## 2013-03-28 DIAGNOSIS — Y929 Unspecified place or not applicable: Secondary | ICD-10-CM | POA: Insufficient documentation

## 2013-03-28 DIAGNOSIS — Z8619 Personal history of other infectious and parasitic diseases: Secondary | ICD-10-CM | POA: Insufficient documentation

## 2013-03-28 DIAGNOSIS — F17200 Nicotine dependence, unspecified, uncomplicated: Secondary | ICD-10-CM | POA: Insufficient documentation

## 2013-03-28 DIAGNOSIS — S4980XA Other specified injuries of shoulder and upper arm, unspecified arm, initial encounter: Secondary | ICD-10-CM | POA: Insufficient documentation

## 2013-03-28 MED ORDER — HYDROCODONE-ACETAMINOPHEN 5-325 MG PO TABS: 1.0000 | ORAL_TABLET | ORAL | Status: DC | PRN

## 2013-03-28 MED ORDER — HYDROCODONE-ACETAMINOPHEN 5-325 MG PO TABS
2.0000 | ORAL_TABLET | Freq: Once | ORAL | Status: AC
  Administered 2013-03-28: 2 via ORAL
  Filled 2013-03-28: qty 2

## 2013-03-28 NOTE — Discharge Instructions (Signed)
Keep arm in sling to help with comfort  Use the RICE method to help with symptoms (see below)  Take Vicodin for severe pain - do not drive while taking this medication it can make you drowsy  Return to the ED if you develop any changing/worsening condition, weakness, fever, loss of sensation, chest pain, difficulty breathing, or any other concerns (see below)     Shoulder Pain The shoulder is the joint that connects your arms to your body. The bones that form the shoulder joint include the upper arm bone (humerus), the shoulder blade (scapula), and the collarbone (clavicle). The top of the humerus is shaped like a ball and fits into a rather flat socket on the scapula (glenoid cavity). A combination of muscles and strong, fibrous tissues that connect muscles to bones (tendons) support your shoulder joint and hold the ball in the socket. Small, fluid-filled sacs (bursae) are located in different areas of the joint. They act as cushions between the bones and the overlying soft tissues and help reduce friction between the gliding tendons and the bone as you move your arm. Your shoulder joint allows a wide range of motion in your arm. This range of motion allows you to do things like scratch your back or throw a ball. However, this range of motion also makes your shoulder more prone to pain from overuse and injury. Causes of shoulder pain can originate from both injury and overuse and usually can be grouped in the following four categories:  Redness, swelling, and pain (inflammation) of the tendon (tendinitis) or the bursae (bursitis).  Instability, such as a dislocation of the joint.  Inflammation of the joint (arthritis).  Broken bone (fracture). HOME CARE INSTRUCTIONS   Apply ice to the sore area.  Put ice in a plastic bag.  Place a towel between your skin and the bag.  Leave the ice on for 15-20 minutes, 03-04 times per day for the first 2 days.  Stop using cold packs if they do not help  with the pain.  If you have a shoulder sling or immobilizer, wear it as long as your caregiver instructs. Only remove it to shower or bathe. Move your arm as little as possible, but keep your hand moving to prevent swelling.  Squeeze a soft ball or foam pad as much as possible to help prevent swelling.  Only take over-the-counter or prescription medicines for pain, discomfort, or fever as directed by your caregiver. SEEK MEDICAL CARE IF:   Your shoulder pain increases, or new pain develops in your arm, hand, or fingers.  Your hand or fingers become cold and numb.  Your pain is not relieved with medicines. SEEK IMMEDIATE MEDICAL CARE IF:   Your arm, hand, or fingers are numb or tingling.  Your arm, hand, or fingers are significantly swollen or turn white or blue. MAKE SURE YOU:   Understand these instructions.  Will watch your condition.  Will get help right away if you are not doing well or get worse. Document Released: 12/11/2004 Document Revised: 11/26/2011 Document Reviewed: 02/15/2011 [facility] Patient Information 2014 [place], [place].  RICE: Routine Care for Injuries The routine care of many injuries includes Rest, Ice, Compression, and Elevation (RICE). HOME CARE INSTRUCTIONS  Rest is needed to allow your body to heal. Routine activities can usually be resumed when comfortable. Injured tendons and bones can take up to 6 weeks to heal. Tendons are the cord-like structures that attach muscle to bone.  Ice following an injury helps keep  the swelling down and reduces pain.  Put ice in a plastic bag.  Place a towel between your skin and the bag.  Leave the ice on for 15-20 minutes, 03-04 times a day. Do this while awake, for the first 24 to 48 hours. After that, continue as directed by your caregiver.  Compression helps keep swelling down. It also gives support and helps with discomfort. If an elastic bandage has been applied, it should be removed and reapplied every 3 to 4  hours. It should not be applied tightly, but firmly enough to keep swelling down. Watch fingers or toes for swelling, bluish discoloration, coldness, numbness, or excessive pain. If any of these problems occur, remove the bandage and reapply loosely. Contact your caregiver if these problems continue.  Elevation helps reduce swelling and decreases pain. With extremities, such as the arms, hands, legs, and feet, the injured area should be placed near or above the level of the heart, if possible. SEEK IMMEDIATE MEDICAL CARE IF:  You have persistent pain and swelling.  You develop redness, numbness, or unexpected weakness.  Your symptoms are getting worse rather than improving after several days. These symptoms may indicate that further evaluation or further X-rays are needed. Sometimes, X-rays may not show a small broken bone (fracture) until 1 week or 10 days later. Make a follow-up appointment with your caregiver. Ask when your X-ray results will be ready. Make sure you get your X-ray results. Document Released: 06/15/2000 Document Revised: 05/26/2011 Document Reviewed: 08/02/2010 [facility] Patient Information 2014 [place], [place].   Emergency Department Resource Guide 1) Find a Doctor and Pay Out of Pocket Although you won't have to find out who is covered by your insurance plan, it is a good idea to ask around and get recommendations. You will then need to call the office and see if the doctor you have chosen will accept you as a new patient and what types of options they offer for patients who are self-pay. Some doctors offer discounts or will set up payment plans for their patients who do not have insurance, but you will need to ask so you aren't surprised when you get to your appointment.  2) Contact Your Local Health Department Not all health departments have doctors that can see patients for sick visits, but many do, so it is worth a call to see if yours does. If you don't know where your  local health department is, you can check in your phone book. The CDC also has a tool to help you locate your state's health department, and many state websites also have listings of all of their local health departments.  3) Find a Walk-in Clinic If your illness is not likely to be very severe or complicated, you may want to try a walk in clinic. These are popping up all over the country in pharmacies, drugstores, and shopping centers. They're usually staffed by nurse practitioners or physician assistants that have been trained to treat common illnesses and complaints. They're usually fairly quick and inexpensive. However, if you have serious medical issues or chronic medical problems, these are probably not your best option.  No Primary Care Doctor: - Call Health Connect at  [phone] - they can help you locate a primary care doctor that  accepts your insurance, provides certain services, etc. - Physician Referral Service- [phone]  Chronic Pain Problems: Organization         Address  Phone   Notes  [facility]  ([phone])  [phone] Patients need to be referred by their primary care doctor.   Medication Assistance: Organization         Address  Phone   Notes  [facility] Medication [facility] [address] [place], [place] [address] [phone] --Must be a resident of [facility] [place] -- Must have NO insurance coverage whatsoever (no Medicaid/ Medicare, etc.) -- The pt. MUST have a primary care doctor that directs their care regularly and follows them in the community   MedAssist  [phone]) [phone]   [employer]  [phone]) [phone]    Agencies that provide inexpensive medical care: Organization         Address  Phone   Notes  [facility]  [phone]   [facility]    [phone]   [facility] [address] [place], [place] [address] [phone]) [phone]   [facility] [address]  [address], [place] [phone]   [facility]anned Parenthood    [phone]   [facility]    [phone]   [facility]  [address], [place] Phone:  [phone], Fax:  [phone]) [phone] Hours of Operation:  9 am - 6 pm, M-F.  Also accepts Medicaid/Medicare and self-pay.  [facility]  [address], [place] Phone: [phone], Fax: [phone]. Hours of Operation:  8:30 am - 5:30 pm, M-F.  Also accepts Medicaid and self-pay.  [facility] [place] [address], [place] Phone: [phone]) [phone]   [facility] [address], [place], [place] [phone], Ext. [phone] Mondays & Thursdays: 7-9 AM.  First 15 patients are seen on a first come, first serve basis.    Medicaid-accepting [facility] Providers:  Organization         Address  Phone   Notes  [facility] [address], [place] [phone] Also accepts self-pay patients.  [facility] [address], [place]  [phone]   [facility] [address], [place] [phone]   [facility] [address] [place] [address], [place] [phone]) [phone]   Renaye RakersVeita Bland [address], [place]   [phone] Only accepts [place] Access [place] patients after they have their name applied to their card.   Self-Pay (no insurance) in [facility]:  Organization         Address  Phone   Notes  Sickle Cell Patients, [facility] [address], [place] [phone]   [facility] [address], [place] [phone]   [facility] [place]  [address], [place] [phone]) [phone]   [facility]/Dr. Osei-Bonsu  [address] [place] [address], [place] or [phone], [place] [phone] Phone number for both [place] and [place] locations is the same.  [facility] [address], [place] [phone]) [phone]   [facility] [place] [address] [place] [address], [place] or [address] [phone]   [facility] [address], [place] [phone]) [phone], phone; [phone]) [phone], fax Sees patients 1st and 3rd Saturday of every month.  Must not qualify for public or private insurance (i.e. Medicaid, Medicare, [place] Health Choice, Veterans' Benefits)  Household income should be no more than 200% of the poverty level The clinic cannot treat you if you are pregnant or think you are pregnant  Sexually transmitted diseases are not treated at the clinic.    Dental Care: Organization         Address  Phone  Notes  [facility] of [facility] [place] [phone] Accepts children up to age 11 who are enrolled in [place] or [place]; pregnant women with a Medicaid card; and children who have applied for Medicaid or [place] Health Choice, but were declined, whose parents can pay a reduced fee at time of service.  [facility] Department of [facility]  [address], [place] [phone] Accepts children up to age 60 who are enrolled in [place] or [place]; pregnant women with a Medicaid card; and children who have applied for Medicaid or [place] Health Choice, but were declined, whose parents can pay a reduced fee at time of service.  [place] Adult [facility] Access PROGRAM  [place] [phone] Patients are seen by appointment only. Walk-ins are not accepted. [place] will see patients 36 years of age and older. Monday - Tuesday (8am-5pm) Most Wednesdays (8:30-5pm) $30 per visit, cash only  [facility] Adult Dental Access PROGRAM  [address], [facility] [phone] Patients are seen by appointment only. Walk-ins are not  accepted. [place] will see patients 5 years of age and older. One Wednesday Evening (Monthly: Volunteer Based).  $30 per visit, cash only  [place]  [phone] for adults; Children under age 5, call Graduate Pediatric Dentistry at [phone]. Children aged 79-14, please call [phone] to request a pediatric application.  Dental services are provided in all areas of dental care including fillings, crowns and bridges, complete and partial dentures, implants, gum treatment, root canals, and extractions. Preventive care is also provided. Treatment is provided to both adults and children. Patients are selected via a lottery and there is often a waiting list.   [facility] [address], [place]  [phone] [email or web address]   [facility] [address] [place], [place] [phone], Ext. [phone] Second and Fourth Thursday of each month, opens at 6:30 AM; Clinic ends at 9 AM.  Patients are seen on a first-come first-served basis, and a limited number are seen during each clinic.   [facility]  [address] Hillard Danker [place], [place] [phone]   Eligibility Requirements You must have lived in [place], [place], or [place] counties for at least the last three months.   You cannot be eligible for state or federal sponsored [employer], including [employer], [place], or [employer].   You generally cannot be eligible for healthcare insurance through your employer.    How to apply: Eligibility screenings are held every Tuesday and Wednesday afternoon from 1:00 pm until 4:00 pm. You do not need an appointment for the interview!  [facility] [address], [place], [place]   [place]  [place] Department  [place]  [phone]    Behavioral Health Resources in the  Community: Intensive Outpatient Programs Organization         Address  Phone  Notes  Milton Elliott. [address] [place],  [place] [phone]   [facility] [address], [place], [place]   ADS: [facility] Drug S[facility] [address], [place], [place]   [place] [address],  [place], [place] or [phone]   Substance Abuse Resources Organization         Address  Phone  Notes  Alcohol and Drug Services  [phone]   [place]  [phone]   The [place]   Chinita Pester  [phone]   Residential & Outpatient Substance Abuse Program  [phone]   Psychological Services Organization         Address  Phone  Notes  [facility] [place]  [place]  [phone]   [place] [address], [place] or [phone]    Mobile Crisis Teams Organization         Address  Phone  Notes  [facility], Mobile Crisis Care Unit  [phone]   [facility]  [address] [place], [place]   [place] Levels [address], [place] [phone]    Self-Help/Support Groups Organization         Address  Phone             Notes  [place]rew. of [place] - variety of support groups  [place] Call for more information  Narcotics Anonymous (NA), [facility] [address], [employer] [place]  2 meetings at this location   Special educational needs teacher         Address  Phone  Notes  [facility]idential [facility] [place],    [place]  [phone]   [facility]  [address] [place] 818299, [place], [place]   [place], [place] [phone] Admissions: 8am-3pm M-F  Incentives Substance [place] [address]    [place], [place] [phone]   [facility] [address] [place], [place], [place]   [facility] [address],  [place], [place]   [facility]ight Programs - Intensive Outpatient [address] Kristeen [address], [place], [place]   [facility] [address].,  [place], [place] [phone] or [phone]   [facility] (RTS) [address] [place], [place] Accepts Medicaid  [facility] [place] [address]  [place]iguel Barrera [place] [phone] Substance Abuse/Addiction Treatment   [facility] Organization         Address  Phone  Notes  [facility]  [phone]   Domenic Schwab, PhD [address] Arlis Porta [place], [place]   [phone] or [phone]   [place], [place] [phone]   [facility] [address] [place], [place] [phone] Insurance/Medicaid/sponsorship through [facility] and Families [address]                                    [place], [place] [phone] [place] [address], [place] [phone]    Dr. Adele Schilder  [phone]   [facility] Dept. 1) [address] [place] 2) [address], [place] °3)  [address], [place] [phone] °[phone] ° °[phone]   °[place] Child Abuse Hotline ([phone]) [phone] or [phone] (After Hours)    ° ° ° °

## 2013-03-28 NOTE — ED Provider Notes (Signed)
CSN: [id number]     Arrival date & time 03/28/13  1738 History   First MD Initiated Contact with Patient 03/28/13 1921     Chief Complaint  Patient presents with  . Shoulder Injury    HPI  Ragna D Louisa Second is a 38 y.o. female with a PMH of Bipolar 1 disorder, hepatitis, depression, anxiety, and thyrotoxicosis who presents to the ED for evaluation of shoulder injury.  History was provided by the patient.  Patient states that around 8 am this morning she was reaching to catch something when she felt a pop in her right shoulder and had the immediate onset of pain.  Her pain is located diffusely throughout her shoulder and is worse with movement.  She has been taking BC powder and Ibuprofen with no relief in her sharp aching pain.  She denies any other injuries/trauma.  She denies any previous shoulder injuries, surgeries, or dislocations in the past.  She denies any numbness/tingling, loss of sensation, or weakness.  She otherwise has been feeling well.  No fevers, chest pain, SOB, dyspnea, cough, abdominal pain, nausea, emesis, neck pain, back pain, or headache.         Past Medical History  Diagnosis Date  . Depression   . Bipolar 1 disorder   . Anxiety   . Abnormal Pap smear     LSIL  . Hepatitis     acute hepatitis C  . Thyrotoxicosis    Past Surgical History  Procedure Laterality Date  . Tubal ligation     Family History  Problem Relation Age of Onset  . Cancer Maternal Grandmother   . Cancer Maternal Aunt    History  Substance Use Topics  . Smoking status: Current Every Day Smoker -- 1.00 packs/day  . Smokeless tobacco: Not on file  . Alcohol Use: No   OB History   Grav Para Term Preterm Abortions TAB SAB Ect Mult Living                 Review of Systems  Constitutional: Negative for fever, activity change, appetite change and fatigue.  Respiratory: Negative for cough and shortness of breath.   Cardiovascular: Negative for chest pain and leg swelling.   Gastrointestinal: Negative for nausea, vomiting and abdominal pain.  Musculoskeletal: Positive for arthralgias. Negative for back pain and neck pain.  Skin: Negative for color change and wound.  Neurological: Negative for dizziness, weakness, light-headedness, numbness and headaches.    Allergies  Toradol and Adhesive  Home Medications   Current Outpatient Rx  Name  Route  Sig  Dispense  Refill  . ARIPiprazole (ABILIFY) 15 MG tablet   Oral   Take 15 mg by mouth every morning.         . Aspirin-Salicylamide-Caffeine (BC HEADACHE) 325-95-16 MG TABS   Oral   Take 2 packets by mouth every 3 (three) hours as needed. *PATIENT STATES she takes 2 packets by mouth 6 to 8 times daily as needed         . DULoxetine (CYMBALTA) 60 MG capsule   Oral   Take 60 mg by mouth daily.          Marland Kitchen ibuprofen (ADVIL,MOTRIN) 200 MG tablet   Oral   Take 800 mg by mouth every 6 (six) hours as needed.         . lamoTRIgine (LAMICTAL) 150 MG tablet   Oral   Take 150 mg by mouth every morning.         Marland Kitchen  traZODone (DESYREL) 150 MG tablet   Oral   Take 150-300 mg by mouth at bedtime. May take 1-2 qhs prn          BP 142/64  Pulse 101  Temp(Src) 98.4 F (36.9 C) (Oral)  Resp 20  Ht 5\' 2"  (1.575 m)  Wt 209 lb (94.802 kg)  BMI 38.22 kg/m2  SpO2 96%  LMP 03/21/2013  Filed Vitals:   03/28/13 1900 03/28/13 2018  BP: 142/64 107/64  Pulse: 101 92  Temp: 98.4 F (36.9 C) 98.6 F (37 C)  TempSrc: Oral Oral  Resp: 20 18  Height: 5\' 2"  (1.575 m)   Weight: 209 lb (94.802 kg)   SpO2: 96% 100%     Physical Exam  Nursing note and vitals reviewed. Constitutional: She is oriented to person, place, and time. She appears well-developed and well-nourished. No distress.  HENT:  Head: Normocephalic and atraumatic.  Right Ear: External ear normal.  Left Ear: External ear normal.  Nose: Nose normal.  Eyes: Conjunctivae are normal. Right eye exhibits no discharge. Left eye exhibits no  discharge.  Neck: Normal range of motion. Neck supple.  No cervical spinal or paraspinal tenderness to palpation throughout.  No limitations with neck ROM.    Cardiovascular: Normal rate, regular rhythm and normal heart sounds.  Exam reveals no gallop and no friction rub.   No murmur heard. Radial pulses present and equal bilaterally  Pulmonary/Chest: Effort normal and breath sounds normal. No respiratory distress. She has no wheezes. She has no rales. She exhibits no tenderness.  Abdominal: Soft. She exhibits no distension. There is no tenderness.  Musculoskeletal: Normal range of motion. She exhibits tenderness. She exhibits no edema.       Arms: Diffuse tenderness to palpation to the right shoulder. No right arm, elbow, wrist, or hand tenderness. Pain to the right shoulder increased with flexion, extension, and abduction of the right shoulder. Range of motion limited due to pain. Strength 5/5 in the upper extremities bilaterally. Patient able to ambulate without difficulty or ataxia.   Neurological: She is alert and oriented to person, place, and time.  Skin: Skin is warm and dry. She is not diaphoretic.  No ecchymosis, edema, erythema, or wounds throughout    ED Course  Procedures (including critical care time) Labs Review Labs Reviewed - No data to display Imaging Review Dg Shoulder Right  03/28/2013   CLINICAL DATA:  Pain  EXAM: RIGHT SHOULDER - 2+ VIEW  COMPARISON:  None available  FINDINGS: There is no evidence of fracture or dislocation. There is no evidence of arthropathy or other focal bone abnormality. Soft tissues are unremarkable.  IMPRESSION: Negative.   Electronically Signed   By: Rise MuBenjamin  McClintock M.D.   On: 03/28/2013 19:49    EKG Interpretation   None       DG Shoulder Right (Final result)  Result time: 03/28/13 19:49:22    Final result by Rad Results In Interface (03/28/13 19:49:22)    Narrative:   CLINICAL DATA: Pain  EXAM: RIGHT SHOULDER - 2+  VIEW  COMPARISON: None available  FINDINGS: There is no evidence of fracture or dislocation. There is no evidence of arthropathy or other focal bone abnormality. Soft tissues are unremarkable.  IMPRESSION: Negative.   Electronically Signed By: Rise MuBenjamin McClintock M.D. On: 03/28/2013 19:49            MDM   Nadalee D Louisa SecondFain is a 38 y.o. female with a PMH of Bipolar 1 disorder, hepatitis, depression, anxiety,  and thyrotoxicosis who presents to the ED for evaluation of shoulder injury.  Etiology of shoulder pain possibly due to a strain/sprain/contusion or rotator cuff tear. X-rays negative for fracture or malalignment/dislocation. Patient neurovascularly intact. Patient given a shoulder sling for comfort and support. Patient has been trying ibuprofen at home with no relief in her symptoms. Patient given a Vicodin in the emergency room which improved her pain. Patient given a short course of Vicodin to use for pain management as an outpatient. Patient encouraged to continue to take ibuprofen for mild/moderate pain. She was educated on the use of the RICE method for symptom control. Patient instructed to followup with an orthopedic surgeon and/or primary care provider. Return precautions, discharge instructions, and follow-up was discussed with the patient before discharge.      Discharge Medication List as of 03/28/2013  8:28 PM    START taking these medications   Details  HYDROcodone-acetaminophen (NORCO/VICODIN) 5-325 MG per tablet Take 1-2 tablets by mouth every 4 (four) hours as needed., Starting 03/28/2013, Until Discontinued, Print         Final impressions: 1. Shoulder pain, right       Greer Ee Zyaire Dumas PA-C           Jillyn Ledger, PA-C 03/30/13 [phone]

## 2013-03-28 NOTE — ED Notes (Signed)
Pain rt shoulder, onset this am when reached to catch something and felt pain and a "pop" in shoulder area, Good rt radial pulse and distal sensation.

## 2013-03-28 NOTE — ED Notes (Signed)
Pt states this am approx 8 am, she reached to catch something and felt her right shoulder "pop out of place"  States she has been unable to move or use it since, having pain worse with movement.

## 2013-03-29 ENCOUNTER — Ambulatory Visit (INDEPENDENT_AMBULATORY_CARE_PROVIDER_SITE_OTHER): Payer: Self-pay | Admitting: Obstetrics & Gynecology

## 2013-03-29 ENCOUNTER — Encounter: Payer: Self-pay | Admitting: Obstetrics & Gynecology

## 2013-03-29 VITALS — BP 100/70 | Ht 63.0 in | Wt 213.0 lb

## 2013-03-29 DIAGNOSIS — IMO0002 Reserved for concepts with insufficient information to code with codable children: Secondary | ICD-10-CM

## 2013-03-29 DIAGNOSIS — R87810 Cervical high risk human papillomavirus (HPV) DNA test positive: Secondary | ICD-10-CM

## 2013-03-29 MED ORDER — METRONIDAZOLE 500 MG PO TABS: 500.0000 mg | ORAL_TABLET | Freq: Two times a day (BID) | ORAL | Status: DC

## 2013-03-29 NOTE — Addendum Note (Signed)
Addended by: Lazaro ArmsEURE, LUTHER H on: 03/29/2013 02:29 PM   Modules accepted: Orders

## 2013-03-29 NOTE — Progress Notes (Signed)
Patient ID: Barbara Simon, female   DOB: 10-08-1975, 38 y.o.   MRN: [id number]  Pap revealed normal cytology but positive for high risk HPV  Colposcopy Adequate No acetowhite changes No punctation No mosaicism  Follow up Pap in 1 year

## 2013-03-30 NOTE — ED Provider Notes (Signed)
Medical screening examination/treatment/procedure(s) were performed by non-physician practitioner and as supervising physician I was immediately available for consultation/collaboration.  Renada Cronin L Miosotis Wetsel, MD 03/30/13 1602 

## 2014-05-23 ENCOUNTER — Encounter (HOSPITAL_COMMUNITY): Payer: Self-pay | Admitting: *Deleted

## 2014-05-23 ENCOUNTER — Emergency Department (HOSPITAL_COMMUNITY)
Admission: EM | Admit: 2014-05-23 | Discharge: 2014-05-23 | Disposition: A | Discharge status: Home / Self Care | Payer: Self-pay | Attending: Emergency Medicine | Admitting: Emergency Medicine

## 2014-05-23 DIAGNOSIS — Z8619 Personal history of other infectious and parasitic diseases: Secondary | ICD-10-CM | POA: Insufficient documentation

## 2014-05-23 DIAGNOSIS — E039 Hypothyroidism, unspecified: Secondary | ICD-10-CM | POA: Insufficient documentation

## 2014-05-23 DIAGNOSIS — Z79899 Other long term (current) drug therapy: Secondary | ICD-10-CM | POA: Insufficient documentation

## 2014-05-23 DIAGNOSIS — Z8719 Personal history of other diseases of the digestive system: Secondary | ICD-10-CM | POA: Insufficient documentation

## 2014-05-23 DIAGNOSIS — E860 Dehydration: Secondary | ICD-10-CM | POA: Insufficient documentation

## 2014-05-23 DIAGNOSIS — F419 Anxiety disorder, unspecified: Secondary | ICD-10-CM | POA: Insufficient documentation

## 2014-05-23 DIAGNOSIS — G43009 Migraine without aura, not intractable, without status migrainosus: Secondary | ICD-10-CM | POA: Insufficient documentation

## 2014-05-23 DIAGNOSIS — R112 Nausea with vomiting, unspecified: Secondary | ICD-10-CM

## 2014-05-23 DIAGNOSIS — Z720 Tobacco use: Secondary | ICD-10-CM | POA: Insufficient documentation

## 2014-05-23 DIAGNOSIS — F319 Bipolar disorder, unspecified: Secondary | ICD-10-CM | POA: Insufficient documentation

## 2014-05-23 MED ORDER — SODIUM CHLORIDE 0.9 % IV BOLUS (SEPSIS)
1000.0000 mL | Freq: Once | INTRAVENOUS | Status: AC
  Administered 2014-05-23: 1000 mL via INTRAVENOUS

## 2014-05-23 MED ORDER — ONDANSETRON HCL 4 MG/2ML IJ SOLN
4.0000 mg | Freq: Once | INTRAMUSCULAR | Status: AC
  Administered 2014-05-23: 4 mg via INTRAVENOUS
  Filled 2014-05-23: qty 2

## 2014-05-23 MED ORDER — ONDANSETRON 4 MG PO TBDP: 4.0000 mg | ORAL_TABLET | Freq: Three times a day (TID) | ORAL | Status: DC | PRN

## 2014-05-23 MED ORDER — METOCLOPRAMIDE HCL 5 MG/ML IJ SOLN
10.0000 mg | Freq: Once | INTRAMUSCULAR | Status: AC
  Administered 2014-05-23: 10 mg via INTRAVENOUS
  Filled 2014-05-23: qty 2

## 2014-05-23 MED ORDER — DEXAMETHASONE SODIUM PHOSPHATE 4 MG/ML IJ SOLN
10.0000 mg | Freq: Once | INTRAMUSCULAR | Status: AC
  Administered 2014-05-23: 10 mg via INTRAVENOUS
  Filled 2014-05-23: qty 3

## 2014-05-23 MED ORDER — DIPHENHYDRAMINE HCL 50 MG/ML IJ SOLN
25.0000 mg | Freq: Once | INTRAMUSCULAR | Status: AC
  Administered 2014-05-23: 25 mg via INTRAVENOUS
  Filled 2014-05-23: qty 1

## 2014-05-23 MED ORDER — SODIUM CHLORIDE 0.9 % IV BOLUS (SEPSIS)
1000.0000 mL | Freq: Once | INTRAVENOUS | Status: AC
Start: 2014-05-23 — End: 2014-05-23
  Administered 2014-05-23: 1000 mL via INTRAVENOUS

## 2014-05-23 NOTE — ED Provider Notes (Signed)
CSN: [id number]     Arrival date & time 05/23/14  0309 History   First MD Initiated Contact with Patient 05/23/14 0330     Chief Complaint  Patient presents with  . Emesis    onset at 8 pm.denies diarrhea.   . Headache    onset was 630 pm yesterday     (Consider location/radiation/quality/duration/timing/severity/associated sxs/prior Treatment) HPI  Patient reports about 6 PM she had bilateral throbbing headache with nausea and vomiting about 7 times. She denies diarrhea or visual changes. She describes photophobia and noise sensitivity. She denies neck pain. She states she's had this headache before however it's been a couple years since she had one. She was at work and had to leave to come to the emergency room for evaluation.   PCP none Psych  [facility]  Past Medical History  Diagnosis Date  . Depression   . Bipolar 1 disorder   . Anxiety   . Abnormal Pap smear     LSIL  . Hepatitis     acute hepatitis C  . Thyrotoxicosis    Past Surgical History  Procedure Laterality Date  . Tubal ligation     Family History  Problem Relation Age of Onset  . Cancer Maternal Grandmother   . Cancer Maternal Aunt    History  Substance Use Topics  . Smoking status: Current Every Day Smoker -- 1.00 packs/day  . Smokeless tobacco: Not on file  . Alcohol Use: No   employed  OB History    No data available     Review of Systems  All other systems reviewed and are negative.     Allergies  Toradol and Adhesive  Home Medications   Prior to Admission medications   Medication Sig Start Date End Date Taking? Authorizing Provider  diazepam (VALIUM) 5 MG tablet Take 5 mg by mouth once.   Yes Historical Provider, MD  ARIPiprazole (ABILIFY) 15 MG tablet Take 15 mg by mouth every morning.    Historical Provider, MD  Aspirin-Salicylamide-Caffeine (BC HEADACHE) 325-95-16 MG TABS Take 2 packets by mouth every 3 (three) hours as needed. *PATIENT STATES she takes 2 packets by mouth 6 to 8  times daily as needed    Historical Provider, MD  DULoxetine (CYMBALTA) 60 MG capsule Take 60 mg by mouth daily.     Historical Provider, MD  HYDROcodone-acetaminophen (NORCO/VICODIN) 5-325 MG per tablet Take 1-2 tablets by mouth every 4 (four) hours as needed. 03/28/13   Jillyn LedgerJessica K Palmer, PA-C  ibuprofen (ADVIL,MOTRIN) 200 MG tablet Take 800 mg by mouth every 6 (six) hours as needed.    Historical Provider, MD  lamoTRIgine (LAMICTAL) 150 MG tablet Take 150 mg by mouth every morning.    Historical Provider, MD  metroNIDAZOLE (FLAGYL) 500 MG tablet Take 1 tablet (500 mg total) by mouth 2 (two) times daily. 03/29/13   Lazaro ArmsLuther H Eure, MD  traZODone (DESYREL) 150 MG tablet Take 150-300 mg by mouth at bedtime. May take 1-2 qhs prn    Historical Provider, MD   BP 112/78 mmHg  Pulse 97  Temp(Src) 98.8 F (37.1 C) (Oral)  Resp 20  Ht 5\' 2"  (1.575 m)  Wt 177 lb (80.287 kg)  BMI 32.37 kg/m2  SpO2 100%  LMP 03/24/2014  Vital signs normal   Physical Exam  Constitutional: She is oriented to person, place, and time. She appears well-developed and well-nourished.  Non-toxic appearance. She does not appear ill. No distress.  HENT:  Head: Normocephalic and atraumatic.  Right Ear: External ear normal.  Left Ear: External ear normal.  Nose: Nose normal. No mucosal edema or rhinorrhea.  Mouth/Throat: Oropharynx is clear and moist and mucous membranes are normal. No dental abscesses or uvula swelling.  Eyes: Conjunctivae and EOM are normal. Pupils are equal, round, and reactive to light.  Neck: Normal range of motion and full passive range of motion without pain. Neck supple.  Cardiovascular: Normal rate, regular rhythm and normal heart sounds.  Exam reveals no gallop and no friction rub.   No murmur heard. Pulmonary/Chest: Effort normal and breath sounds normal. No respiratory distress. She has no wheezes. She has no rhonchi. She has no rales. She exhibits no tenderness and no crepitus.  Abdominal: Soft.  Normal appearance.  Musculoskeletal: Normal range of motion. She exhibits no edema or tenderness.  Moves all extremities well.   Neurological: She is alert and oriented to person, place, and time. She has normal strength. No cranial nerve deficit.  Skin: Skin is warm, dry and intact. No rash noted. No erythema. No pallor.  Psychiatric: She has a normal mood and affect. Her speech is normal and behavior is normal. Her mood appears not anxious.  Nursing note and vitals reviewed.   ED Course  Procedures (including critical care time)  Medications  sodium chloride 0.9 % bolus 1,000 mL (1,000 mLs Intravenous New Bag/Given 05/23/14 0419)  sodium chloride 0.9 % bolus 1,000 mL (0 mLs Intravenous Stopped 05/23/14 0513)  metoCLOPramide (REGLAN) injection 10 mg (10 mg Intravenous Given 05/23/14 0419)  diphenhydrAMINE (BENADRYL) injection 25 mg (25 mg Intravenous Given 05/23/14 0419)  dexamethasone (DECADRON) injection 10 mg (10 mg Intravenous Given 05/23/14 0418)  ondansetron (ZOFRAN) injection 4 mg (4 mg Intravenous Given 05/23/14 0538)    Recheck at 5:30 am Headache is improving, still has some nausea. Is starting to have urine output and has almost finished her second liter of NS.  IV zofran ordered.   Recheck at discharge pt states her headache and nausea are almost gone, feels good enough to be discharged.   Labs Review Labs Reviewed - No data to display  Imaging Review No results found.   EKG Interpretation None      MDM   Final diagnoses:  Migraine without aura and without status migrainosus, not intractable  Nausea and vomiting, vomiting of unspecified type  Dehydration   New Prescriptions   ONDANSETRON (ZOFRAN ODT) 4 MG DISINTEGRATING TABLET    Take 1 tablet (4 mg total) by mouth every 8 (eight) hours as needed for nausea or vomiting.    Plan discharge  Devoria Albe, MD, Concha Pyo, MD 05/23/14 [phone]

## 2014-05-23 NOTE — ED Notes (Signed)
Pt resting quietly.  Respirations regular, even and unlabored.  No distress noted.  Pt reporting generalized improvement in pain level.

## 2014-05-23 NOTE — Discharge Instructions (Signed)
Go home and rest. Use the zofran if needed for nausea or vomiting. Recheck as needed.

## 2014-05-27 ENCOUNTER — Emergency Department (HOSPITAL_COMMUNITY)
Admission: EM | Admit: 2014-05-27 | Discharge: 2014-05-28 | Disposition: A | Discharge status: Home / Self Care | Payer: Self-pay | Attending: Emergency Medicine | Admitting: Emergency Medicine

## 2014-05-27 ENCOUNTER — Encounter (HOSPITAL_COMMUNITY): Payer: Self-pay | Admitting: *Deleted

## 2014-05-27 DIAGNOSIS — F319 Bipolar disorder, unspecified: Secondary | ICD-10-CM | POA: Insufficient documentation

## 2014-05-27 DIAGNOSIS — Z8639 Personal history of other endocrine, nutritional and metabolic disease: Secondary | ICD-10-CM | POA: Insufficient documentation

## 2014-05-27 DIAGNOSIS — Z792 Long term (current) use of antibiotics: Secondary | ICD-10-CM | POA: Insufficient documentation

## 2014-05-27 DIAGNOSIS — F419 Anxiety disorder, unspecified: Secondary | ICD-10-CM | POA: Insufficient documentation

## 2014-05-27 DIAGNOSIS — N760 Acute vaginitis: Secondary | ICD-10-CM | POA: Insufficient documentation

## 2014-05-27 DIAGNOSIS — R102 Pelvic and perineal pain: Secondary | ICD-10-CM

## 2014-05-27 DIAGNOSIS — Z79899 Other long term (current) drug therapy: Secondary | ICD-10-CM | POA: Insufficient documentation

## 2014-05-27 DIAGNOSIS — Z3202 Encounter for pregnancy test, result negative: Secondary | ICD-10-CM | POA: Insufficient documentation

## 2014-05-27 DIAGNOSIS — Z8619 Personal history of other infectious and parasitic diseases: Secondary | ICD-10-CM | POA: Insufficient documentation

## 2014-05-27 DIAGNOSIS — B9689 Other specified bacterial agents as the cause of diseases classified elsewhere: Secondary | ICD-10-CM

## 2014-05-27 MED ORDER — ONDANSETRON 8 MG PO TBDP
8.0000 mg | ORAL_TABLET | Freq: Once | ORAL | Status: AC
  Administered 2014-05-27: 8 mg via ORAL
  Filled 2014-05-27: qty 1

## 2014-05-27 MED ORDER — OXYCODONE-ACETAMINOPHEN 5-325 MG PO TABS
2.0000 | ORAL_TABLET | Freq: Once | ORAL | Status: AC
Start: 2014-05-27 — End: 2014-05-27
  Administered 2014-05-27: 2 via ORAL
  Filled 2014-05-27: qty 2

## 2014-05-27 NOTE — ED Notes (Signed)
EDP in with pt 

## 2014-05-27 NOTE — ED Provider Notes (Addendum)
CSN: [id number]     Arrival date & time 05/27/14  2141 History  This chart was scribed for Lorre NickAnthony Taija Mathias, MD by Tonye RoyaltyJoshua Chen, ED Scribe. This patient was seen in room APA02/APA02 and the patient's care was started at 11:26 PM.    Chief Complaint  Patient presents with  . Abdominal Pain   The history is provided by the patient. No language interpreter was used.    HPI Comments:  Barbara Simon is a 39 y.o. female brought in by parents to the Emergency Department complaining of right lower abdominal cramping with onset yesterday, worse tonight. She states pain is worse with walking. She states pain is similar to prior ovarian cyst, which she thinks was on the right side. She reports associated urinary frequency but denies dysuria. She states she feels nauseated but has not vomited. She states her period is 1.5 months late and is sexually active. She states she had exploratory abdominal surgery at young age and had appendectomy. She denies fever, chills, or vaginal bleeding.sx persistent and no tx used pta  Past Medical History  Diagnosis Date  . Depression   . Bipolar 1 disorder   . Anxiety   . Abnormal Pap smear     LSIL  . Hepatitis     acute hepatitis C  . Thyrotoxicosis    Past Surgical History  Procedure Laterality Date  . Tubal ligation     Family History  Problem Relation Age of Onset  . Cancer Maternal Grandmother   . Cancer Maternal Aunt    History  Substance Use Topics  . Smoking status: Current Every Day Smoker -- 1.00 packs/day  . Smokeless tobacco: Not on file  . Alcohol Use: No   OB History    No data available     Review of Systems  Constitutional: Negative for fever and chills.  Gastrointestinal: Positive for nausea and abdominal pain. Negative for vomiting.  Genitourinary: Positive for frequency. Negative for dysuria and vaginal bleeding.  All other systems reviewed and are negative.     Allergies  Toradol and Adhesive  Home Medications   Prior to  Admission medications   Medication Sig Start Date End Date Taking? Authorizing Provider  ARIPiprazole (ABILIFY) 15 MG tablet Take 15 mg by mouth every morning.    Historical Provider, MD  Aspirin-Salicylamide-Caffeine (BC HEADACHE) 325-95-16 MG TABS Take 2 packets by mouth every 3 (three) hours as needed. *PATIENT STATES she takes 2 packets by mouth 6 to 8 times daily as needed    Historical Provider, MD  diazepam (VALIUM) 5 MG tablet Take 5 mg by mouth once.    Historical Provider, MD  DULoxetine (CYMBALTA) 60 MG capsule Take 60 mg by mouth daily.     Historical Provider, MD  HYDROcodone-acetaminophen (NORCO/VICODIN) 5-325 MG per tablet Take 1-2 tablets by mouth every 4 (four) hours as needed. 03/28/13   Jillyn LedgerJessica K Palmer, PA-C  ibuprofen (ADVIL,MOTRIN) 200 MG tablet Take 800 mg by mouth every 6 (six) hours as needed.    Historical Provider, MD  lamoTRIgine (LAMICTAL) 150 MG tablet Take 150 mg by mouth every morning.    Historical Provider, MD  metroNIDAZOLE (FLAGYL) 500 MG tablet Take 1 tablet (500 mg total) by mouth 2 (two) times daily. 03/29/13   Lazaro ArmsLuther H Eure, MD  ondansetron (ZOFRAN ODT) 4 MG disintegrating tablet Take 1 tablet (4 mg total) by mouth every 8 (eight) hours as needed for nausea or vomiting. 05/23/14   Devoria AlbeIva Knapp, MD  traZODone (  DESYREL) 150 MG tablet Take 150-300 mg by mouth at bedtime. May take 1-2 qhs prn    Historical Provider, MD   BP 126/83 mmHg  Pulse 72  Temp(Src) 98.2 F (36.8 C) (Oral)  Resp 20  Ht  (1.575 m)  Wt 177 lb (80.287 kg)  BMI 32.37 kg/m2  SpO2 100%  LMP 03/24/2014 Physical Exam  Constitutional: She is oriented to person, place, and time. She appears well-developed and well-nourished.  Non-toxic appearance. No distress.  HENT:  Head: Normocephalic and atraumatic.  Eyes: Conjunctivae, EOM and lids are normal. Pupils are equal, round, and reactive to light.  Neck: Normal range of motion. Neck supple. No tracheal deviation present. No thyroid mass  present.  Cardiovascular: Normal rate, regular rhythm and normal heart sounds.  Exam reveals no gallop.   No murmur heard. Pulmonary/Chest: Effort normal and breath sounds normal. No stridor. No respiratory distress. She has no decreased breath sounds. She has no wheezes. She has no rhonchi. She has no rales.  Abdominal: Soft. Normal appearance and bowel sounds are normal. She exhibits no distension. There is tenderness (RLQ). There is no rebound, no guarding and no CVA tenderness.  Genitourinary: Vagina normal. Right adnexum displays tenderness. Right adnexum displays no mass.  Musculoskeletal: Normal range of motion. She exhibits no edema or tenderness.  Neurological: She is alert and oriented to person, place, and time. She has normal strength. No cranial nerve deficit or sensory deficit. GCS eye subscore is 4. GCS verbal subscore is 5. GCS motor subscore is 6.  Skin: Skin is warm and dry. No abrasion and no rash noted.  Psychiatric: She has a normal mood and affect. Her speech is normal and behavior is normal.  Nursing note and vitals reviewed.   ED Course  Procedures (including critical care time)  DIAGNOSTIC STUDIES: Oxygen Saturation is 100% on room air, normal by my interpretation.    COORDINATION OF CARE: 11:31 PM Discussed treatment plan with patient at beside, including blood work, UA, and pelvic exam. The patient agrees with the plan and has no further questions at this time.   Labs Review Labs Reviewed  URINALYSIS, ROUTINE W REFLEX MICROSCOPIC  PREGNANCY, URINE    Imaging Review No results found.   EKG Interpretation None      MDM   Final diagnoses:  None    I personally performed the services described in this documentation, which was scribed in my presence. The recorded information has been reviewed and is accurate.  Pt with likely ovarian cyst, given pain meds and feels better, will tx for BV  Lorre Nick, MD 05/28/14 1610  Lorre Nick,  MD 05/28/14 [phone]

## 2014-05-27 NOTE — ED Notes (Signed)
Pt with right lower abd cramping, late on her monthly period as well

## 2014-05-28 LAB — URINALYSIS, ROUTINE W REFLEX MICROSCOPIC
Bilirubin Urine: NEGATIVE
Glucose, UA: NEGATIVE mg/dL
KETONES UR: NEGATIVE mg/dL
Nitrite: NEGATIVE
Protein, ur: NEGATIVE mg/dL
Urobilinogen, UA: 0.2 mg/dL (ref 0.0–1.0)
pH: 5.5 (ref 5.0–8.0)

## 2014-05-28 LAB — WET PREP, GENITAL
TRICH WET PREP: NONE SEEN
YEAST WET PREP: NONE SEEN

## 2014-05-28 LAB — URINE MICROSCOPIC-ADD ON

## 2014-05-28 LAB — PREGNANCY, URINE: PREG TEST UR: NEGATIVE

## 2014-05-28 MED ORDER — METRONIDAZOLE 500 MG PO TABS: 500.0000 mg | ORAL_TABLET | Freq: Two times a day (BID) | ORAL | Status: DC

## 2014-05-28 MED ORDER — OXYCODONE-ACETAMINOPHEN 5-325 MG PO TABS: 2.0000 | ORAL_TABLET | ORAL | Status: DC | PRN

## 2014-05-28 NOTE — ED Notes (Signed)
Lab called about results for wet prep and gc/c

## 2014-05-28 NOTE — Discharge Instructions (Signed)
Abdominal Pain, Women °Abdominal (stomach, pelvic, or belly) pain can be caused by many things. It is important to tell your doctor: °· The location of the pain. °· Does it come and go or is it present all the time? °· Are there things that start the pain (eating certain foods, exercise)? °· Are there other symptoms associated with the pain (fever, nausea, vomiting, diarrhea)? °All of this is helpful to know when trying to find the cause of the pain. °CAUSES  °· Stomach: virus or bacteria infection, or ulcer. °· Intestine: appendicitis (inflamed appendix), regional ileitis (Crohn's disease), ulcerative colitis (inflamed colon), irritable bowel syndrome, diverticulitis (inflamed diverticulum of the colon), or cancer of the stomach or intestine. °· Gallbladder disease or stones in the gallbladder. °· Kidney disease, kidney stones, or infection. °· Pancreas infection or cancer. °· Fibromyalgia (pain disorder). °· Diseases of the female organs: °· Uterus: fibroid (non-cancerous) tumors or infection. °· Fallopian tubes: infection or tubal pregnancy. °· Ovary: cysts or tumors. °· Pelvic adhesions (scar tissue). °· Endometriosis (uterus lining tissue growing in the pelvis and on the pelvic organs). °· Pelvic congestion syndrome (female organs filling up with blood just before the menstrual period). °· Pain with the menstrual period. °· Pain with ovulation (producing an egg). °· Pain with an IUD (intrauterine device, birth control) in the uterus. °· Cancer of the female organs. °· Functional pain (pain not caused by a disease, may improve without treatment). °· Psychological pain. °· Depression. °DIAGNOSIS  °Your doctor will decide the seriousness of your pain by doing an examination. °· Blood tests. °· X-rays. °· Ultrasound. °· CT scan (computed tomography, special type of X-ray). °· MRI (magnetic resonance imaging). °· Cultures, for infection. °· Barium enema (dye inserted in the large intestine, to better view it with  X-rays). °· Colonoscopy (looking in intestine with a lighted tube). °· Laparoscopy (minor surgery, looking in abdomen with a lighted tube). °· Major abdominal exploratory surgery (looking in abdomen with a large incision). °TREATMENT  °The treatment will depend on the cause of the pain.  °· Many cases can be observed and treated at home. °· Over-the-counter medicines recommended by your caregiver. °· Prescription medicine. °· Antibiotics, for infection. °· Birth control pills, for painful periods or for ovulation pain. °· Hormone treatment, for endometriosis. °· Nerve blocking injections. °· Physical therapy. °· Antidepressants. °· Counseling with a psychologist or psychiatrist. °· Minor or major surgery. °HOME CARE INSTRUCTIONS  °· Do not take laxatives, unless directed by your caregiver. °· Take over-the-counter pain medicine only if ordered by your caregiver. Do not take aspirin because it can cause an upset stomach or bleeding. °· Try a clear liquid diet (broth or water) as ordered by your caregiver. Slowly move to a bland diet, as tolerated, if the pain is related to the stomach or intestine. °· Have a thermometer and take your temperature several times a day, and record it. °· Bed rest and sleep, if it helps the pain. °· Avoid sexual intercourse, if it causes pain. °· Avoid stressful situations. °· Keep your follow-up appointments and tests, as your caregiver orders. °· If the pain does not go away with medicine or surgery, you may try: °· Acupuncture. °· Relaxation exercises (yoga, meditation). °· Group therapy. °· Counseling. °SEEK MEDICAL CARE IF:  °· You notice certain foods cause stomach pain. °· Your home care treatment is not helping your pain. °· You need stronger pain medicine. °· You want your IUD removed. °· You feel faint or   lightheaded. °· You develop nausea and vomiting. °· You develop a rash. °· You are having side effects or an allergy to your medicine. °SEEK IMMEDIATE MEDICAL CARE IF:  °· Your  pain does not go away or gets worse. °· You have a fever. °· Your pain is felt only in portions of the abdomen. The right side could possibly be appendicitis. The left lower portion of the abdomen could be colitis or diverticulitis. °· You are passing blood in your stools (bright red or black tarry stools, with or without vomiting). °· You have blood in your urine. °· You develop chills, with or without a fever. °· You pass out. °MAKE SURE YOU:  °· Understand these instructions. °· Will watch your condition. °· Will get help right away if you are not doing well or get worse. °Document Released: 12/29/2006 Document Revised: 07/18/2013 Document Reviewed: 01/18/2009 °ExitCare® Patient Information ©2015 [employer]. This information is not intended to replace advice given to you by your health care provider. Make sure you discuss any questions you have with your health care provider. °Bacterial Vaginosis °Bacterial vaginosis is a vaginal infection that occurs when the normal balance of bacteria in the vagina is disrupted. It results from an overgrowth of certain bacteria. This is the most common vaginal infection in women of childbearing age. Treatment is important to prevent complications, especially in pregnant women, as it can cause a premature delivery. °CAUSES  °Bacterial vaginosis is caused by an increase in harmful bacteria that are normally present in smaller amounts in the vagina. Several different kinds of bacteria can cause bacterial vaginosis. However, the reason that the condition develops is not fully understood. °RISK FACTORS °Certain activities or behaviors can put you at an increased risk of developing bacterial vaginosis, including: °· Having a new sex partner or multiple sex partners. °· Douching. °· Using an intrauterine device (IUD) for contraception. °Women do not get bacterial vaginosis from toilet seats, bedding, swimming pools, or contact with objects around them. °SIGNS AND SYMPTOMS  °Some  women with bacterial vaginosis have no signs or symptoms. Common symptoms include: °· Grey vaginal discharge. °· A fishlike odor with discharge, especially after sexual intercourse. °· Itching or burning of the vagina and vulva. °· Burning or pain with urination. °DIAGNOSIS  °Your health care provider will take a medical history and examine the vagina for signs of bacterial vaginosis. A sample of vaginal fluid may be taken. Your health care provider will look at this sample under a microscope to check for bacteria and abnormal cells. A vaginal pH test may also be done.  °TREATMENT  °Bacterial vaginosis may be treated with antibiotic medicines. These may be given in the form of a pill or a vaginal cream. A second round of antibiotics may be prescribed if the condition comes back after treatment.  °HOME CARE INSTRUCTIONS  °· Only take over-the-counter or prescription medicines as directed by your health care provider. °· If antibiotic medicine was prescribed, take it as directed. Make sure you finish it even if you start to feel better. °· Do not have sex until treatment is completed. °· Tell all sexual partners that you have a vaginal infection. They should see their health care provider and be treated if they have problems, such as a mild rash or itching. °· Practice safe sex by using condoms and only having one sex partner. °SEEK MEDICAL CARE IF:  °· Your symptoms are not improving after 3 days of treatment. °· You have increased discharge   or pain. °· You have a fever. °MAKE SURE YOU:  °· Understand these instructions. °· Will watch your condition. °· Will get help right away if you are not doing well or get worse. °FOR MORE INFORMATION  °Centers for Disease Control and Prevention, Division of STD Prevention: www.cdc.gov/std °American Sexual Health Association (ASHA): www.ashastd.org  °Document Released: 03/03/2005 Document Revised: 12/22/2012 Document Reviewed: 10/13/2012 °ExitCare® Patient Information ©2015  [employer]. This information is not intended to replace advice given to you by your health care provider. Make sure you discuss any questions you have with your health care provider. ° °

## 2014-05-29 ENCOUNTER — Emergency Department (HOSPITAL_COMMUNITY)
Admission: EM | Admit: 2014-05-29 | Discharge: 2014-05-29 | Disposition: A | Discharge status: Home / Self Care | Payer: Self-pay | Attending: Emergency Medicine | Admitting: Emergency Medicine

## 2014-05-29 ENCOUNTER — Encounter (HOSPITAL_COMMUNITY): Payer: Self-pay

## 2014-05-29 DIAGNOSIS — N939 Abnormal uterine and vaginal bleeding, unspecified: Secondary | ICD-10-CM | POA: Insufficient documentation

## 2014-05-29 DIAGNOSIS — Z8639 Personal history of other endocrine, nutritional and metabolic disease: Secondary | ICD-10-CM | POA: Insufficient documentation

## 2014-05-29 DIAGNOSIS — Z79899 Other long term (current) drug therapy: Secondary | ICD-10-CM | POA: Insufficient documentation

## 2014-05-29 DIAGNOSIS — R102 Pelvic and perineal pain: Secondary | ICD-10-CM | POA: Insufficient documentation

## 2014-05-29 DIAGNOSIS — Z720 Tobacco use: Secondary | ICD-10-CM | POA: Insufficient documentation

## 2014-05-29 DIAGNOSIS — Z8619 Personal history of other infectious and parasitic diseases: Secondary | ICD-10-CM | POA: Insufficient documentation

## 2014-05-29 DIAGNOSIS — F419 Anxiety disorder, unspecified: Secondary | ICD-10-CM | POA: Insufficient documentation

## 2014-05-29 DIAGNOSIS — Z9851 Tubal ligation status: Secondary | ICD-10-CM | POA: Insufficient documentation

## 2014-05-29 DIAGNOSIS — F319 Bipolar disorder, unspecified: Secondary | ICD-10-CM | POA: Insufficient documentation

## 2014-05-29 LAB — GC/CHLAMYDIA PROBE AMP (~~LOC~~) NOT AT ARMC
Chlamydia: NEGATIVE
NEISSERIA GONORRHEA: NEGATIVE

## 2014-05-29 MED ORDER — OXYCODONE-ACETAMINOPHEN 5-325 MG PO TABS
2.0000 | ORAL_TABLET | Freq: Once | ORAL | Status: AC
  Administered 2014-05-29: 2 via ORAL
  Filled 2014-05-29: qty 2

## 2014-05-29 MED ORDER — OXYCODONE-ACETAMINOPHEN 5-325 MG PO TABS: 1.0000 | ORAL_TABLET | ORAL | Status: DC | PRN

## 2014-05-29 NOTE — ED Provider Notes (Signed)
CSN: [id number]     Arrival date & time 05/29/14  1804 History  This chart was scribed for Mancel BaleElliott Casper Pagliuca, MD by Gwenyth Oberatherine Macek, ED Scribe. This patient was seen in room APA08/AP[facility]08 and the patient's care was started at 8:52 PM.    Chief Complaint  Patient presents with  . Pelvic Pain   The history is provided by the patient. No language interpreter was used.    HPI Comments: Barbara Simon is a 39 y.o. female with a history of tubal ligation and a right ovarian cyst who presents to the Emergency Department complaining of constant, gradually worsening suprapubic abdominal pain that started 3 days ago. She states vaginal bleeding that started today and increased frequency as associated symptoms. Pt was seen in the ED 2 days ago for the same symptoms and was diagnosed with Bacterial Vaginosis. She was prescribed Flagyl and Percocet with no relief to her symptoms. Pt reports increased pain and bleeding started today after she had intercourse last night (usual partner). She notes last menses was 2 months ago and denies a history of cramping with menstruation. Pt denies vomiting, fever and dysuria as associated symptoms.  Past Medical History  Diagnosis Date  . Depression   . Bipolar 1 disorder   . Anxiety   . Abnormal Pap smear     LSIL  . Hepatitis     acute hepatitis C  . Thyrotoxicosis    Past Surgical History  Procedure Laterality Date  . Tubal ligation     Family History  Problem Relation Age of Onset  . Cancer Maternal Grandmother   . Cancer Maternal Aunt    History  Substance Use Topics  . Smoking status: Current Every Day Smoker -- 1.00 packs/day  . Smokeless tobacco: Not on file  . Alcohol Use: No   OB History    No data available     Review of Systems  Constitutional: Negative for fever.  Gastrointestinal: Positive for abdominal pain.  Genitourinary: Positive for frequency and vaginal bleeding. Negative for dysuria.  All other systems reviewed and are  negative.     Allergies  Toradol and Adhesive  Home Medications   Prior to Admission medications   Medication Sig Start Date End Date Taking? Authorizing Provider  ARIPiprazole (ABILIFY) 15 MG tablet Take 15 mg by mouth every morning.    Historical Provider, MD  Aspirin-Salicylamide-Caffeine (BC HEADACHE) 325-95-16 MG TABS Take 2 packets by mouth every 3 (three) hours as needed. *PATIENT STATES she takes 2 packets by mouth 6 to 8 times daily as needed    Historical Provider, MD  diazepam (VALIUM) 5 MG tablet Take 5 mg by mouth once.    Historical Provider, MD  DULoxetine (CYMBALTA) 60 MG capsule Take 60 mg by mouth daily.     Historical Provider, MD  ibuprofen (ADVIL,MOTRIN) 200 MG tablet Take 800 mg by mouth every 6 (six) hours as needed.    Historical Provider, MD  lamoTRIgine (LAMICTAL) 150 MG tablet Take 150 mg by mouth every morning.    Historical Provider, MD  metroNIDAZOLE (FLAGYL) 500 MG tablet Take 1 tablet (500 mg total) by mouth 2 (two) times daily. 05/28/14   Lorre NickAnthony Allen, MD  ondansetron (ZOFRAN ODT) 4 MG disintegrating tablet Take 1 tablet (4 mg total) by mouth every 8 (eight) hours as needed for nausea or vomiting. 05/23/14   Devoria AlbeIva Knapp, MD  oxyCODONE-acetaminophen (PERCOCET/ROXICET) 5-325 MG per tablet Take 1 tablet by mouth every 4 (four) hours as needed. 05/29/14  Mancel Bale, MD  traZODone (DESYREL) 150 MG tablet Take 150-300 mg by mouth at bedtime. May take 1-2 qhs prn    Historical Provider, MD   BP 94/57 mmHg  Pulse 53  Temp(Src) 98.1 F (36.7 C) (Oral)  Resp 16  Ht 5' 2.5" (1.588 m)  Wt 177 lb (80.287 kg)  BMI 31.84 kg/m2  SpO2 100%  LMP 03/24/2014 Physical Exam  Constitutional: She is oriented to person, place, and time. She appears well-developed and well-nourished.  HENT:  Head: Normocephalic and atraumatic.  Eyes: Conjunctivae and EOM are normal. Pupils are equal, round, and reactive to light.  Neck: Normal range of motion and phonation normal. Neck  supple.  Cardiovascular: Normal rate and regular rhythm.   Pulmonary/Chest: Effort normal and breath sounds normal. She exhibits no tenderness.  Abdominal: Soft. She exhibits no distension. There is no tenderness. There is no guarding.  Mild suprapubic bilateral tenderness  Musculoskeletal: Normal range of motion.  Neurological: She is alert and oriented to person, place, and time. She exhibits normal muscle tone.  Skin: Skin is warm and dry.  Psychiatric: She has a normal mood and affect. Her behavior is normal. Judgment and thought content normal.  Nursing note and vitals reviewed.   ED Course  Procedures   DIAGNOSTIC STUDIES: Oxygen Saturation is 100% on RA, normal by my interpretation.    COORDINATION OF CARE: 9:00 PM Discussed treatment plan with pt at bedside and pt agreed to plan.  Medications  oxyCODONE-acetaminophen (PERCOCET/ROXICET) 5-325 MG per tablet 2 tablet (2 tablets Oral Given 05/29/14 2103)    Patient Vitals for the past 24 hrs:  BP Temp Temp src Pulse Resp SpO2 Height Weight  05/29/14 2110 - 98.1 F (36.7 C) Oral - - - - -  05/29/14 2041 94/57 mmHg - - (!) 53 16 100 % - -  05/29/14 1816 121/77 mmHg 98.6 F (37 C) Oral 76 20 100 % 5' 2.5" (1.588 m) 177 lb (80.287 kg)      Labs Review Labs Reviewed - No data to display  Imaging Review No results found.   EKG Interpretation None      MDM   Final diagnoses:  Pelvic pain in female     Nonspecific pelvic pain with abnormal vaginal bleeding. Pelvic examination deferred since it was done 2 days ago. She is set up for outpatient ultrasound, in the morning to evaluate for ovarian cyst or other pelvic pathology. Tests are spectral infection, metabolic instability or impending vascular collapse   Nursing Notes Reviewed/ Care Coordinated Applicable Imaging Reviewed Interpretation of Laboratory Data incorporated into ED treatment  The patient appears reasonably screened and/or stabilized for discharge  and I doubt any other medical condition or other [facility] requiring further screening, evaluation, or treatment in the ED at this time prior to discharge.  Plan: Home Medications- Percocet PP to go; Home Treatments- rest; return here if the recommended treatment, does not improve the symptoms; Recommended follow up- AM Pelvic U/S  I personally performed the services described in this documentation, which was scribed in my presence. The recorded information has been reviewed and is accurate.     Mancel Bale, MD 05/30/14 [facility] Manners
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
## 2014-05-29 NOTE — ED Notes (Signed)
Pt c/o lower abd pain.  Reports was seen here Sat and diagnosed with pelvic pain and was told she had an infection.  Reports was put on antibiotics.  Pt says after having sex last night, started having vaginal bleeding.  Pt says pain worse today.

## 2014-05-30 ENCOUNTER — Other Ambulatory Visit (HOSPITAL_COMMUNITY): Payer: Self-pay | Admitting: Emergency Medicine

## 2014-05-30 ENCOUNTER — Ambulatory Visit (HOSPITAL_COMMUNITY)
Admit: 2014-05-30 | Discharge: 2014-05-30 | Disposition: A | Discharge status: Home / Self Care | Payer: Self-pay | Source: Ambulatory Visit | Attending: Emergency Medicine | Admitting: Emergency Medicine

## 2014-05-30 DIAGNOSIS — R102 Pelvic and perineal pain: Secondary | ICD-10-CM

## 2014-05-30 NOTE — ED Provider Notes (Signed)
Patient informed of ultrasound results.  No evidence for distress, she is ambulatory, will follow-up with primary care and/or gynecology.  Gerhard Munchobert Haytham Maher, MD 05/30/14 1245

## 2014-09-05 ENCOUNTER — Encounter (HOSPITAL_COMMUNITY): Payer: Self-pay | Admitting: Emergency Medicine

## 2014-09-05 ENCOUNTER — Emergency Department (HOSPITAL_COMMUNITY)
Admission: EM | Admit: 2014-09-05 | Discharge: 2014-09-06 | Disposition: A | Discharge status: Home / Self Care | Payer: Self-pay | Attending: Emergency Medicine | Admitting: Emergency Medicine

## 2014-09-05 DIAGNOSIS — Z3202 Encounter for pregnancy test, result negative: Secondary | ICD-10-CM | POA: Insufficient documentation

## 2014-09-05 DIAGNOSIS — S1086XA Insect bite of other specified part of neck, initial encounter: Secondary | ICD-10-CM | POA: Insufficient documentation

## 2014-09-05 DIAGNOSIS — F419 Anxiety disorder, unspecified: Secondary | ICD-10-CM | POA: Insufficient documentation

## 2014-09-05 DIAGNOSIS — S59901A Unspecified injury of right elbow, initial encounter: Secondary | ICD-10-CM | POA: Insufficient documentation

## 2014-09-05 DIAGNOSIS — Z792 Long term (current) use of antibiotics: Secondary | ICD-10-CM | POA: Insufficient documentation

## 2014-09-05 DIAGNOSIS — Z79899 Other long term (current) drug therapy: Secondary | ICD-10-CM | POA: Insufficient documentation

## 2014-09-05 DIAGNOSIS — S62001A Unspecified fracture of navicular [scaphoid] bone of right wrist, initial encounter for closed fracture: Secondary | ICD-10-CM | POA: Insufficient documentation

## 2014-09-05 DIAGNOSIS — F319 Bipolar disorder, unspecified: Secondary | ICD-10-CM | POA: Insufficient documentation

## 2014-09-05 DIAGNOSIS — S0011XA Contusion of right eyelid and periocular area, initial encounter: Secondary | ICD-10-CM | POA: Insufficient documentation

## 2014-09-05 DIAGNOSIS — Y9289 Other specified places as the place of occurrence of the external cause: Secondary | ICD-10-CM | POA: Insufficient documentation

## 2014-09-05 DIAGNOSIS — Z8639 Personal history of other endocrine, nutritional and metabolic disease: Secondary | ICD-10-CM | POA: Insufficient documentation

## 2014-09-05 DIAGNOSIS — Z720 Tobacco use: Secondary | ICD-10-CM | POA: Insufficient documentation

## 2014-09-05 DIAGNOSIS — S3992XA Unspecified injury of lower back, initial encounter: Secondary | ICD-10-CM | POA: Insufficient documentation

## 2014-09-05 DIAGNOSIS — Y998 Other external cause status: Secondary | ICD-10-CM | POA: Insufficient documentation

## 2014-09-05 DIAGNOSIS — Y9389 Activity, other specified: Secondary | ICD-10-CM | POA: Insufficient documentation

## 2014-09-05 DIAGNOSIS — W57XXXA Bitten or stung by nonvenomous insect and other nonvenomous arthropods, initial encounter: Secondary | ICD-10-CM | POA: Insufficient documentation
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
## 2014-09-06 ENCOUNTER — Emergency Department (HOSPITAL_COMMUNITY): Payer: Self-pay

## 2014-09-06 LAB — URINE MICROSCOPIC-ADD ON

## 2014-09-06 LAB — URINALYSIS, ROUTINE W REFLEX MICROSCOPIC
GLUCOSE, UA: NEGATIVE mg/dL
KETONES UR: 15 mg/dL — AB
LEUKOCYTES UA: NEGATIVE
Nitrite: NEGATIVE
PH: 6 (ref 5.0–8.0)
Protein, ur: NEGATIVE mg/dL
Specific Gravity, Urine: >1.03 — ABNORMAL HIGH (ref 1.005–1.030)
Urobilinogen, UA: 0.2 mg/dL (ref 0.0–1.0)

## 2014-09-06 LAB — POC URINE PREG, ED: Preg Test, Ur: NEGATIVE

## 2014-09-06 MED ORDER — IBUPROFEN 600 MG PO TABS: 600.0000 mg | ORAL_TABLET | Freq: Four times a day (QID) | ORAL | Status: DC | PRN

## 2014-09-06 MED ORDER — HYDROCODONE-ACETAMINOPHEN 5-325 MG PO TABS: 1.0000 | ORAL_TABLET | Freq: Four times a day (QID) | ORAL | Status: DC | PRN
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
## 2014-09-06 NOTE — ED Notes (Signed)
Patient transported to X-ray 

## 2014-09-06 NOTE — ED Notes (Signed)
Pt resting quietly.  No distress noted.  
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
## 2014-09-16 ENCOUNTER — Emergency Department (HOSPITAL_COMMUNITY)
Admission: EM | Admit: 2014-09-16 | Discharge: 2014-09-16 | Disposition: A | Discharge status: Home / Self Care | Payer: Self-pay | Attending: Emergency Medicine | Admitting: Emergency Medicine

## 2014-09-16 ENCOUNTER — Encounter (HOSPITAL_COMMUNITY): Payer: Self-pay | Admitting: Emergency Medicine

## 2014-09-16 ENCOUNTER — Emergency Department (HOSPITAL_COMMUNITY): Payer: Self-pay

## 2014-09-16 DIAGNOSIS — N8320 Unspecified ovarian cysts: Secondary | ICD-10-CM | POA: Insufficient documentation

## 2014-09-16 DIAGNOSIS — Z720 Tobacco use: Secondary | ICD-10-CM | POA: Insufficient documentation

## 2014-09-16 DIAGNOSIS — Z792 Long term (current) use of antibiotics: Secondary | ICD-10-CM | POA: Insufficient documentation

## 2014-09-16 DIAGNOSIS — F419 Anxiety disorder, unspecified: Secondary | ICD-10-CM | POA: Insufficient documentation

## 2014-09-16 DIAGNOSIS — Z3202 Encounter for pregnancy test, result negative: Secondary | ICD-10-CM | POA: Insufficient documentation

## 2014-09-16 DIAGNOSIS — R52 Pain, unspecified: Secondary | ICD-10-CM

## 2014-09-16 DIAGNOSIS — N83209 Unspecified ovarian cyst, unspecified side: Secondary | ICD-10-CM

## 2014-09-16 DIAGNOSIS — Z8639 Personal history of other endocrine, nutritional and metabolic disease: Secondary | ICD-10-CM | POA: Insufficient documentation

## 2014-09-16 DIAGNOSIS — F319 Bipolar disorder, unspecified: Secondary | ICD-10-CM | POA: Insufficient documentation

## 2014-09-16 LAB — URINE MICROSCOPIC-ADD ON

## 2014-09-16 LAB — COMPREHENSIVE METABOLIC PANEL
ALT: 9 U/L — ABNORMAL LOW (ref 14–54)
ANION GAP: 11 (ref 5–15)
AST: 22 U/L (ref 15–41)
Albumin: 3.8 g/dL (ref 3.5–5.0)
Alkaline Phosphatase: 60 U/L (ref 38–126)
BILIRUBIN TOTAL: 0.8 mg/dL (ref 0.3–1.2)
BUN: 9 mg/dL (ref 6–20)
CO2: 22 mmol/L (ref 22–32)
Calcium: 8.3 mg/dL — ABNORMAL LOW (ref 8.9–10.3)
Chloride: 105 mmol/L (ref 101–111)
Creatinine, Ser: 0.86 mg/dL (ref 0.44–1.00)
GFR calc Af Amer: >60 mL/min (ref >60)
GLUCOSE: 156 mg/dL — AB (ref 65–99)
Potassium: 2.9 mmol/L — ABNORMAL LOW (ref 3.5–5.1)
Sodium: 138 mmol/L (ref 135–145)
Total Protein: 6.1 g/dL — ABNORMAL LOW (ref 6.5–8.1)

## 2014-09-16 LAB — URINALYSIS, ROUTINE W REFLEX MICROSCOPIC
Bilirubin Urine: NEGATIVE
GLUCOSE, UA: NEGATIVE mg/dL
Ketones, ur: NEGATIVE mg/dL
LEUKOCYTES UA: NEGATIVE
NITRITE: NEGATIVE
Protein, ur: NEGATIVE mg/dL
SPECIFIC GRAVITY, URINE: 1.015 (ref 1.005–1.030)
UROBILINOGEN UA: 1 mg/dL (ref 0.0–1.0)
pH: 6.5 (ref 5.0–8.0)

## 2014-09-16 LAB — CBC WITH DIFFERENTIAL/PLATELET
BASOS ABS: 0 10*3/uL (ref 0.0–0.1)
BASOS PCT: 0 % (ref 0–1)
EOS ABS: 0.2 10*3/uL (ref 0.0–0.7)
Eosinophils Relative: 2 % (ref 0–5)
HCT: 39.9 % (ref 36.0–46.0)
Hemoglobin: 13.9 g/dL (ref 12.0–15.0)
Lymphocytes Relative: 25 % (ref 12–46)
Lymphs Abs: 2.2 10*3/uL (ref 0.7–4.0)
MCH: 32.6 pg (ref 26.0–34.0)
MCHC: 34.8 g/dL (ref 30.0–36.0)
MCV: 93.4 fL (ref 78.0–100.0)
MONO ABS: 0.3 10*3/uL (ref 0.1–1.0)
Monocytes Relative: 4 % (ref 3–12)
Neutro Abs: 6 10*3/uL (ref 1.7–7.7)
Neutrophils Relative %: 69 % (ref 43–77)
PLATELETS: 255 10*3/uL (ref 150–400)
RBC: 4.27 MIL/uL (ref 3.87–5.11)
RDW: 13.6 % (ref 11.5–15.5)
WBC: 8.8 10*3/uL (ref 4.0–10.5)

## 2014-09-16 LAB — PREGNANCY, URINE: PREG TEST UR: NEGATIVE

## 2014-09-16 LAB — LIPASE, BLOOD: Lipase: 18 U/L — ABNORMAL LOW (ref 22–51)

## 2014-09-16 MED ORDER — ONDANSETRON HCL 4 MG/2ML IJ SOLN
4.0000 mg | Freq: Once | INTRAMUSCULAR | Status: AC
  Administered 2014-09-16: 4 mg via INTRAVENOUS
  Filled 2014-09-16: qty 2

## 2014-09-16 MED ORDER — SODIUM CHLORIDE 0.9 % IV SOLN
1000.0000 mL | Freq: Once | INTRAVENOUS | Status: AC
  Administered 2014-09-16: 1000 mL via INTRAVENOUS

## 2014-09-16 MED ORDER — POTASSIUM CHLORIDE CRYS ER 20 MEQ PO TBCR
40.0000 meq | EXTENDED_RELEASE_TABLET | Freq: Once | ORAL | Status: AC
  Administered 2014-09-16: 40 meq via ORAL
  Filled 2014-09-16: qty 2

## 2014-09-16 MED ORDER — HYDROMORPHONE HCL 1 MG/ML IJ SOLN
0.5000 mg | Freq: Once | INTRAMUSCULAR | Status: AC
  Administered 2014-09-16: 0.5 mg via INTRAVENOUS
  Filled 2014-09-16: qty 1

## 2014-09-16 MED ORDER — HYDROCODONE-ACETAMINOPHEN 5-325 MG PO TABS: 1.0000 | ORAL_TABLET | Freq: Four times a day (QID) | ORAL | Status: DC | PRN

## 2014-09-16 NOTE — ED Notes (Addendum)
Pt BP dropped to 86/50, took a second reading and BP was 90/61. Started pt on 1L fluid bolus. Pt denies dizziness, nausea, or diaphoresis at this time.

## 2014-09-16 NOTE — ED Notes (Signed)
Pt made aware a urine specimen was needed. Pt verbalized understanding. 

## 2014-09-16 NOTE — ED Notes (Signed)
Pt reports lower abdominal pain,dysuria, nausea since this am. Pt reports " i feel like i have to pee but can't."

## 2014-09-16 NOTE — ED Provider Notes (Signed)
CSN: [id number]     Arrival date & time 09/16/14  1046 History  This chart was scribed for Bethann Berkshire, MD by Roxy Cedar, ED Scribe. This patient was seen in room APA09/APA09 and the patient's care was started at 12:08 PM.   Chief Complaint  Patient presents with  . Abdominal Pain   Patient is a 39 y.o. female presenting with abdominal pain. The history is provided by the patient (Pt complains). No language interpreter was used.  Abdominal Pain Pain location:  Suprapubic Pain quality: aching   Pain radiates to:  Back Pain severity:  Moderate Onset quality:  Gradual Duration:  2 days Progression:  Worsening Chronicity:  New Relieved by:  None tried Worsened by:  Nothing tried Ineffective treatments:  None tried Associated symptoms: nausea   Associated symptoms: no chest pain, no cough, no diarrhea, no fatigue and no hematuria    HPI Comments: Barbara Simon is a 39 y.o. female who presents to the Emergency Department complaining of moderate, gradually worsening, suprapubic pain and pressure with associated nausea and increased urgency onset 2 days ago. She also reports associated back pain that worsened yesterday when patient was doing yard work. She denies associated fever, chills or cough.  Past Medical History  Diagnosis Date  . Depression   . Bipolar 1 disorder   . Anxiety   . Abnormal Pap smear     LSIL  . Thyrotoxicosis    Past Surgical History  Procedure Laterality Date  . Tubal ligation    . Abdominal surgery    . Appendectomy     Family History  Problem Relation Age of Onset  . Cancer Maternal Grandmother   . Cancer Maternal Aunt    History  Substance Use Topics  . Smoking status: Current Every Day Smoker -- 1.00 packs/day  . Smokeless tobacco: Not on file  . Alcohol Use: No   OB History    No data available     Review of Systems  Constitutional: Negative for appetite change and fatigue.  HENT: Negative for congestion, ear discharge and sinus  pressure.   Eyes: Negative for discharge.  Respiratory: Negative for cough.   Cardiovascular: Negative for chest pain.  Gastrointestinal: Positive for nausea and abdominal pain. Negative for diarrhea.  Genitourinary: Negative for frequency and hematuria.  Musculoskeletal: Negative for back pain.  Skin: Negative for rash.  Neurological: Negative for seizures and headaches.  Psychiatric/Behavioral: Negative for hallucinations.   Allergies  Toradol and Adhesive  Home Medications   Prior to Admission medications   Medication Sig Start Date End Date Taking? Authorizing Provider  citalopram (CELEXA) 20 MG tablet Take 20 mg by mouth at bedtime.    Yes Historical Provider, MD  naproxen sodium (ALEVE) 220 MG tablet Take 440 mg by mouth daily as needed (pain).   Yes Historical Provider, MD  traZODone (DESYREL) 150 MG tablet Take 300 mg by mouth at bedtime.    Yes Historical Provider, MD  HYDROcodone-acetaminophen (NORCO/VICODIN) 5-325 MG per tablet Take 1 tablet by mouth every 6 (six) hours as needed. Patient not taking: Reported on 09/16/2014 09/06/14   Shon Baton, MD  ibuprofen (ADVIL,MOTRIN) 600 MG tablet Take 1 tablet (600 mg total) by mouth every 6 (six) hours as needed. Patient not taking: Reported on 09/16/2014 09/06/14   Shon Baton, MD  metroNIDAZOLE (FLAGYL) 500 MG tablet Take 1 tablet (500 mg total) by mouth 2 (two) times daily. Patient not taking: Reported on 09/16/2014 05/28/14   Ethelene Browns  Allen, MD  ondansetron (ZOFRAN ODT) 4 MG disintegrating tablet Take 1 tablet Freida Busman(4 mg total) by mouth every 8 (eight) hours as needed for nausea or vomiting. Patient not taking: Reported on 09/16/2014 05/23/14   Devoria AlbeIva Knapp, MD  oxyCODONE-acetaminophen (PERCOCET/ROXICET) 5-325 MG per tablet Take 1 tablet by mouth every 4 (four) hours as needed. Patient not taking: Reported on 09/16/2014 05/29/14   Mancel BaleElliott Wentz, MD   Triage Vitals: BP 90/61 mmHg  Pulse 82  Temp(Src) 97.9 F (36.6 C) (Oral)  Resp 18   Ht 5\' 2"  (1.575 m)  Wt 180 lb (81.647 kg)  BMI 32.91 kg/m2  SpO2 96%  LMP 09/05/2014  Physical Exam  Constitutional: She is oriented to person, place, and time. She appears well-developed.  HENT:  Head: Normocephalic.  Eyes: Conjunctivae and EOM are normal. No scleral icterus.  Neck: Neck supple. No thyromegaly present.  Cardiovascular: Normal rate and regular rhythm.  Exam reveals no gallop and no friction rub.   No murmur heard. Pulmonary/Chest: No stridor. She has no wheezes. She has no rales. She exhibits no tenderness.  Abdominal: She exhibits no distension. There is no tenderness. There is no rebound.  Mild suprapubic tenderness noted.  Musculoskeletal: Normal range of motion. She exhibits no edema.  Lymphadenopathy:    She has no cervical adenopathy.  Neurological: She is oriented to person, place, and time. She exhibits normal muscle tone. Coordination normal.  Skin: No rash noted. No erythema.  Psychiatric: She has a normal mood and affect. Her behavior is normal.  Nursing note and vitals reviewed.  ED Course  Procedures (including critical care time)  DIAGNOSTIC STUDIES: Oxygen Saturation is 96% on RA, normal by my interpretation.    COORDINATION OF CARE: 12:10 PM- Discussed plans to order diagnostic lab work and urinalysis. Will give pt Zofran and IV fluids. Pt advised of plan for treatment and pt agrees.  Labs Review Labs Reviewed  CBC WITH DIFFERENTIAL/PLATELET  COMPREHENSIVE METABOLIC PANEL  LIPASE, BLOOD  URINALYSIS, ROUTINE W REFLEX MICROSCOPIC (NOT AT [facility])  PREGNANCY, URINE   Imaging Review No results found.   EKG Interpretation None     MDM   Final diagnoses:  None   Pt with suprapubic discomfort,  Ct shows ovarian cyst.  Will culture urine,  tx with vicodin and have pt follow up with gyn  The chart was scribed for me under my direct supervision.  I personally performed the history, physical, and medical decision making and all procedures in  the evaluation of this patient.Bethann Berkshire.   Joei Frangos, MD 09/16/14 [id number]

## 2014-09-16 NOTE — ED Notes (Signed)
Brought pt Sprite per pt request.

## 2014-09-16 NOTE — ED Notes (Signed)
MD Zammit at bedside. 

## 2014-09-16 NOTE — Discharge Instructions (Signed)
Follow up with dr. Renne MuscaFergurson in 1-2 weeks for recheck

## 2014-09-18 LAB — URINE CULTURE: Special Requests: NORMAL

## 2014-12-05 ENCOUNTER — Emergency Department (HOSPITAL_COMMUNITY): Payer: Self-pay

## 2014-12-05 ENCOUNTER — Emergency Department (HOSPITAL_COMMUNITY)
Admission: EM | Admit: 2014-12-05 | Discharge: 2014-12-05 | Disposition: A | Discharge status: Home / Self Care | Payer: Self-pay | Attending: Emergency Medicine | Admitting: Emergency Medicine

## 2014-12-05 ENCOUNTER — Encounter (HOSPITAL_COMMUNITY): Payer: Self-pay | Admitting: Emergency Medicine

## 2014-12-05 DIAGNOSIS — F319 Bipolar disorder, unspecified: Secondary | ICD-10-CM | POA: Insufficient documentation

## 2014-12-05 DIAGNOSIS — Z79899 Other long term (current) drug therapy: Secondary | ICD-10-CM | POA: Insufficient documentation

## 2014-12-05 DIAGNOSIS — Z720 Tobacco use: Secondary | ICD-10-CM | POA: Insufficient documentation

## 2014-12-05 DIAGNOSIS — J9801 Acute bronchospasm: Secondary | ICD-10-CM

## 2014-12-05 DIAGNOSIS — B370 Candidal stomatitis: Secondary | ICD-10-CM | POA: Insufficient documentation

## 2014-12-05 DIAGNOSIS — J40 Bronchitis, not specified as acute or chronic: Secondary | ICD-10-CM

## 2014-12-05 DIAGNOSIS — F419 Anxiety disorder, unspecified: Secondary | ICD-10-CM | POA: Insufficient documentation

## 2014-12-05 DIAGNOSIS — Z7952 Long term (current) use of systemic steroids: Secondary | ICD-10-CM | POA: Insufficient documentation

## 2014-12-05 DIAGNOSIS — J209 Acute bronchitis, unspecified: Secondary | ICD-10-CM | POA: Insufficient documentation

## 2014-12-05 MED ORDER — AZITHROMYCIN 250 MG PO TABS: ORAL_TABLET | ORAL | Status: DC

## 2014-12-05 MED ORDER — ALBUTEROL SULFATE (2.5 MG/3ML) 0.083% IN NEBU: 2.5000 mg | INHALATION_SOLUTION | Freq: Four times a day (QID) | RESPIRATORY_TRACT | Status: DC | PRN

## 2014-12-05 MED ORDER — ALBUTEROL SULFATE (2.5 MG/3ML) 0.083% IN NEBU
2.5000 mg | INHALATION_SOLUTION | RESPIRATORY_TRACT | Status: DC | PRN
  Administered 2014-12-05: 2.5 mg via RESPIRATORY_TRACT
  Filled 2014-12-05: qty 3

## 2014-12-05 MED ORDER — GUAIFENESIN-CODEINE 100-10 MG/5ML PO SOLN: 10.0000 mL | Freq: Three times a day (TID) | ORAL | Status: DC | PRN

## 2014-12-05 MED ORDER — NYSTATIN 100000 UNIT/ML MT SUSP: 5.0000 mL | Freq: Four times a day (QID) | OROMUCOSAL | Status: DC

## 2014-12-05 MED ORDER — ALBUTEROL SULFATE HFA 108 (90 BASE) MCG/ACT IN AERS
2.0000 | INHALATION_SPRAY | Freq: Four times a day (QID) | RESPIRATORY_TRACT | Status: DC
  Administered 2014-12-05: 2 via RESPIRATORY_TRACT
  Filled 2014-12-05 (×2): qty 6.7

## 2014-12-05 MED ORDER — HYDROCOD POLST-CPM POLST ER 10-8 MG/5ML PO SUER
5.0000 mL | Freq: Once | ORAL | Status: AC
  Administered 2014-12-05: 5 mL via ORAL
  Filled 2014-12-05: qty 5

## 2014-12-05 MED ORDER — ALBUTEROL SULFATE HFA 108 (90 BASE) MCG/ACT IN AERS: 1.0000 | INHALATION_SPRAY | Freq: Four times a day (QID) | RESPIRATORY_TRACT | Status: DC | PRN

## 2014-12-05 MED ORDER — HYDROCODONE-ACETAMINOPHEN 5-325 MG PO TABS
1.0000 | ORAL_TABLET | Freq: Once | ORAL | Status: AC
  Administered 2014-12-05: 1 via ORAL
  Filled 2014-12-05: qty 1

## 2014-12-05 NOTE — ED Notes (Signed)
Pt c/o of cough and SOB since Wednesday. Pt states she was seen in [place] ED, prescribed steroids with no relief. Pt diagnosed with an URI.

## 2014-12-05 NOTE — ED Provider Notes (Signed)
CSN: [id number]     Arrival date & time 12/05/14  1605 History   First MD Initiated Contact with Patient 12/05/14 1839     Chief Complaint  Patient presents with  . Cough      HPI  Patient presents for evaluation of continued cough. She is a smoker. Had a cough for week. Nonproductive dry and hacking. 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 Seen at [facility] hospital 2 days ago. Had a normal chest x-ray. Given Tessalon, and prednisone, however no inhaler. No fevers. No chest pain although she states she's "sore in my ribs from coughing".  Past Medical History  Diagnosis Date  . Depression   . Bipolar 1 disorder   . Anxiety   . Abnormal Pap smear     LSIL  . Thyrotoxicosis    Past Surgical History  Procedure Laterality Date  . Tubal ligation    . Abdominal surgery    . Appendectomy     Family History  Problem Relation Age of Onset  . Cancer Maternal Grandmother   . Cancer Maternal Aunt    Social History  Substance Use Topics  . Smoking status: Current Every Day Smoker -- 1.00 packs/day  . Smokeless tobacco: None  . Alcohol Use: No   OB History    No data available     Review of Systems  Constitutional: Negative for fever, chills, diaphoresis, appetite change and fatigue.  HENT: Negative for mouth sores, sore throat and trouble swallowing.   Eyes: Negative for visual disturbance.  Respiratory: Positive for cough and wheezing. Negative for chest tightness and shortness of breath.   Cardiovascular: Negative for chest pain.  Gastrointestinal: Negative for nausea, vomiting, abdominal pain, diarrhea and abdominal distention.  Endocrine: Negative for polydipsia, polyphagia and polyuria.  Genitourinary: Negative for dysuria, frequency and hematuria.  Musculoskeletal: Negative for gait problem.  Skin: Negative for color change, pallor and rash.  Neurological: Negative for dizziness, syncope, light-headedness and headaches.   Hematological: Does not bruise/bleed easily.  Psychiatric/Behavioral: Negative for behavioral problems and confusion.      Allergies  Toradol and Adhesive  Home Medications   Prior to Admission medications   Medication Sig Start Date End Date Taking? Authorizing Provider  benzonatate (TESSALON) 100 MG capsule Take 200 mg by mouth 3 (three) times daily.   Yes Historical Provider, MD  citalopram (CELEXA) 20 MG tablet Take 20 mg by mouth at bedtime.    Yes Historical Provider, MD  predniSONE (DELTASONE) 20 MG tablet Take 40 mg by mouth daily with breakfast.   Yes Historical Provider, MD  traZODone (DESYREL) 150 MG tablet Take 300 mg by mouth at bedtime.    Yes Historical Provider, MD  albuterol (PROVENTIL HFA;VENTOLIN HFA) 108 (90 BASE) MCG/ACT inhaler Inhale 1-2 puffs into the lungs every 6 (six) hours as needed for wheezing. 12/05/14   Rolland Porter, MD  azithromycin (ZITHROMAX Z-PAK) 250 MG tablet As directed 12/05/14   Rolland Porter, MD  guaiFENesin-codeine 100-10 MG/5ML syrup Take 10 mLs by mouth 3 (three) times daily as needed for cough. 12/05/14   Rolland Porter, MD  HYDROcodone-acetaminophen (NORCO/VICODIN) 5-325 MG per tablet Take 1 tablet by mouth every 6 (six) hours as needed. Patient not taking: Reported on 12/05/2014 09/16/14   Bethann Berkshire, MD  naproxen sodium (ALEVE) 220 MG tablet Take 440 mg by mouth daily as needed (pain).    Historical Provider, MD  nystatin (MYCOSTATIN) 100000 UNIT/ML suspension Take 5 mLs (500,000 Units  total) by mouth 4 (four) times daily. 12/05/14   Rolland Porter, MD   BP 112/73 mmHg  Pulse 87  Temp(Src) 98.2 F (36.8 C) (Oral)  Resp 20  Ht  (1.6 m)  Wt 180 lb (81.647 kg)  BMI 31.89 kg/m2  SpO2 96%  LMP 11/19/2014 Physical Exam  Constitutional: She is oriented to person, place, and time. She appears well-developed and well-nourished. No distress.  HENT:  Head: Normocephalic.  Eyes: Conjunctivae are normal. Pupils are equal, round, and reactive to light.  No scleral icterus.  Neck: Normal range of motion. Neck supple. No thyromegaly present.  Cardiovascular: Normal rate and regular rhythm.  Exam reveals no gallop and no friction rub.   No murmur heard. Pulmonary/Chest: Effort normal. No respiratory distress. She has wheezes. She has no rales.  Frequent cough. Wheezing prolongation. No focal diminished breath sound.  Abdominal: Soft. Bowel sounds are normal. She exhibits no distension. There is no tenderness. There is no rebound.  Musculoskeletal: Normal range of motion.  Neurological: She is alert and oriented to person, place, and time.  Skin: Skin is warm and dry. No rash noted.  Psychiatric: She has a normal mood and affect. Her behavior is normal.    ED Course  Procedures (including critical care time) Labs Review Labs Reviewed - No data to display  Imaging Review Dg Chest 2 View  12/05/2014   CLINICAL DATA:  Dry cough, shortness of breath.  EXAM: CHEST  2 VIEW  COMPARISON:  December 02, 2014.  FINDINGS: The heart size and mediastinal contours are within normal limits. Both lungs are clear. No pneumothorax or pleural effusion is noted. The visualized skeletal structures are unremarkable.  IMPRESSION: No active cardiopulmonary disease.   Electronically Signed   By: Lupita Raider, M.D.   On: 12/05/2014 16:47   I have personally reviewed and evaluated these images and lab results as part of my medical decision-making.   EKG Interpretation None      MDM   Final diagnoses:  Bronchitis  Bronchospasm  Thrush    Given an hour long albuterol neb. On recheck is much improved. Moving good air. Well oxygenated. No pneumonia on x-ray. Plan is continue prednisone, nystatin swish and swallow for her thrush, Zithromax, albuterol. Dispense an inhaler. Stop smoking.    Rolland Porter, MD 12/05/14 2007

## 2014-12-05 NOTE — Discharge Instructions (Signed)
Bronchospasm A bronchospasm is when the tubes that carry air in and out of your lungs (airways) spasm or tighten. During a bronchospasm it is hard to breathe. This is because the airways get smaller. A bronchospasm can be triggered by:  Allergies. These may be to animals, pollen, food, or mold.  Infection. This is a common cause of bronchospasm.  Exercise.  Irritants. These include pollution, cigarette smoke, strong odors, aerosol sprays, and paint fumes.  Weather changes.  Stress.  Being emotional. HOME CARE   Always have a plan for getting help. Know when to call your doctor and local emergency services (911 in the [place] Know where you can get emergency care.  Only take medicines as told by your doctor.  If you were prescribed an inhaler or nebulizer machine, ask your doctor how to use it correctly. Always use a spacer with your inhaler if you were given one.  Stay calm during an attack. Try to relax and breathe more slowly.  Control your home environment:  Change your heating and air conditioning filter at least once a month.  Limit your use of fireplaces and wood stoves.  Do not  smoke. Do not  allow smoking in your home.  Avoid perfumes and fragrances.  Get rid of pests (such as roaches and mice) and their droppings.  Throw away plants if you see mold on them.  Keep your house clean and dust free.  Replace carpet with wood, tile, or vinyl flooring. Carpet can trap dander and dust.  Use allergy-proof pillows, mattress covers, and box spring covers.  Wash bed sheets and blankets every week in hot water. Dry them in a dryer.  Use blankets that are made of polyester or cotton.  Wash hands frequently. GET HELP IF:  You have muscle aches.  You have chest pain.  The thick spit you spit or cough up (sputum) changes from clear or white to yellow, green, gray, or bloody.  The thick spit you spit or cough up gets thicker.  There are problems that may be related  to the medicine you are given such as:  A rash.  Itching.  Swelling.  Trouble breathing. GET HELP RIGHT AWAY IF:  You feel you cannot breathe or catch your breath.  You cannot stop coughing.  Your treatment is not helping you breathe better.  You have very bad chest pain. MAKE SURE YOU:   Understand these instructions.  Will watch your condition.  Will get help right away if you are not doing well or get worse. Document Released: 12/29/2008 Document Revised: 03/08/2013 Document Reviewed: 08/24/2012 Exit[employer] Patient Information 2015 [employer]. This information is not intended to replace advice given to you by your health care provider. Make sure you discuss any questions you have with your health care provider.  

## 2014-12-26 ENCOUNTER — Ambulatory Visit: Payer: Self-pay | Admitting: Physician Assistant

## 2014-12-27 ENCOUNTER — Encounter: Payer: Self-pay | Admitting: Student

## 2015-06-21 ENCOUNTER — Emergency Department (HOSPITAL_COMMUNITY)
Admission: EM | Admit: 2015-06-21 | Discharge: 2015-06-21 | Disposition: A | Discharge status: Home / Self Care | Payer: BLUE CROSS/BLUE SHIELD | Attending: Emergency Medicine | Admitting: Emergency Medicine

## 2015-06-21 ENCOUNTER — Encounter (HOSPITAL_COMMUNITY): Payer: Self-pay | Admitting: *Deleted

## 2015-06-21 ENCOUNTER — Emergency Department (HOSPITAL_COMMUNITY): Payer: BLUE CROSS/BLUE SHIELD

## 2015-06-21 DIAGNOSIS — F17200 Nicotine dependence, unspecified, uncomplicated: Secondary | ICD-10-CM | POA: Diagnosis not present

## 2015-06-21 DIAGNOSIS — Z79899 Other long term (current) drug therapy: Secondary | ICD-10-CM | POA: Insufficient documentation

## 2015-06-21 DIAGNOSIS — J209 Acute bronchitis, unspecified: Secondary | ICD-10-CM | POA: Diagnosis not present

## 2015-06-21 DIAGNOSIS — F319 Bipolar disorder, unspecified: Secondary | ICD-10-CM | POA: Diagnosis not present

## 2015-06-21 DIAGNOSIS — R05 Cough: Secondary | ICD-10-CM | POA: Diagnosis present

## 2015-06-21 MED ORDER — HYDROCOD POLST-CPM POLST ER 10-8 MG/5ML PO SUER
5.0000 mL | Freq: Once | ORAL | Status: AC
  Administered 2015-06-21: 5 mL via ORAL
  Filled 2015-06-21: qty 5

## 2015-06-21 MED ORDER — DOXYCYCLINE HYCLATE 100 MG PO CAPS: 100.0000 mg | ORAL_CAPSULE | Freq: Two times a day (BID) | ORAL | Status: DC

## 2015-06-21 MED ORDER — DOXYCYCLINE HYCLATE 100 MG PO TABS
100.0000 mg | ORAL_TABLET | Freq: Once | ORAL | Status: AC
  Administered 2015-06-21: 100 mg via ORAL
  Filled 2015-06-21: qty 1

## 2015-06-21 MED ORDER — IPRATROPIUM-ALBUTEROL 0.5-2.5 (3) MG/3ML IN SOLN
3.0000 mL | Freq: Once | RESPIRATORY_TRACT | Status: AC
  Administered 2015-06-21: 3 mL via RESPIRATORY_TRACT
  Filled 2015-06-21: qty 3

## 2015-06-21 MED ORDER — DEXAMETHASONE SODIUM PHOSPHATE 4 MG/ML IJ SOLN
8.0000 mg | Freq: Once | INTRAMUSCULAR | Status: AC
  Administered 2015-06-21: 8 mg via INTRAMUSCULAR
  Filled 2015-06-21: qty 2

## 2015-06-21 MED ORDER — IBUPROFEN 800 MG PO TABS
800.0000 mg | ORAL_TABLET | Freq: Once | ORAL | Status: AC
  Administered 2015-06-21: 800 mg via ORAL
  Filled 2015-06-21: qty 1

## 2015-06-21 MED ORDER — PROMETHAZINE-DM 6.25-15 MG/5ML PO SYRP: 10.0000 mL | ORAL_SOLUTION | Freq: Four times a day (QID) | ORAL | Status: DC | PRN

## 2015-06-21 MED ORDER — ALBUTEROL SULFATE HFA 108 (90 BASE) MCG/ACT IN AERS
2.0000 | INHALATION_SPRAY | Freq: Once | RESPIRATORY_TRACT | Status: AC
  Administered 2015-06-21: 2 via RESPIRATORY_TRACT
  Filled 2015-06-21: qty 6.7

## 2015-06-21 MED ORDER — OXYMETAZOLINE HCL 0.05 % NA SOLN
2.0000 | Freq: Two times a day (BID) | NASAL | Status: DC
  Administered 2015-06-21: 2 via NASAL
  Filled 2015-06-21: qty 15

## 2015-06-21 MED ORDER — DEXAMETHASONE 4 MG PO TABS: 4.0000 mg | ORAL_TABLET | Freq: Two times a day (BID) | ORAL | Status: DC

## 2015-06-21 MED ORDER — PROMETHAZINE HCL 12.5 MG PO TABS
12.5000 mg | ORAL_TABLET | Freq: Once | ORAL | Status: AC
  Administered 2015-06-21: 12.5 mg via ORAL
  Filled 2015-06-21: qty 1

## 2015-06-21 MED ORDER — IPRATROPIUM BROMIDE 0.02 % IN SOLN: 0.5000 mg | Freq: Once | RESPIRATORY_TRACT | Status: DC

## 2015-06-21 MED ORDER — ALBUTEROL SULFATE (2.5 MG/3ML) 0.083% IN NEBU
2.5000 mg | INHALATION_SOLUTION | Freq: Once | RESPIRATORY_TRACT | Status: DC
Start: 2015-06-21 — End: 2015-06-21

## 2015-06-21 NOTE — ED Notes (Signed)
Pt c/o cough, headache, wheezing that started last night, weakness, denies any fever or chills,

## 2015-06-21 NOTE — Discharge Instructions (Signed)
Your chest x-ray is negative for pneumonia, collapsed lung, or other emergent problem. Your oxygen levels range from 93-97%. It is important that you use the albuterol every 4 hours over the next 3 or 4 days. Use the Decadron 2 times daily with food as directed. Use 2 squirts of Afrin in each nostril every 8 hours for 5 days only. Use promethazine cough medication every 6 hours as needed. This medication may cause drowsiness, please use with caution. Please use the doxycycline daily until all taken. Please arrange to see Dr. Kathie Rhodes Acute Bronchitis Bronchitis is inflammation of the airways that extend from the windpipe into the lungs (bronchi). The inflammation often causes mucus to develop. This leads to a cough, which is the most common symptom of bronchitis.  In acute bronchitis, the condition usually develops suddenly and goes away over time, usually in a couple weeks. Smoking, allergies, and asthma can make bronchitis worse. Repeated episodes of bronchitis may cause further lung problems.  CAUSES Acute bronchitis is most often caused by the same virus that causes a cold. The virus can spread from person to person (contagious) through coughing, sneezing, and touching contaminated objects. SIGNS AND SYMPTOMS   Cough.   Fever.   Coughing up mucus.   Body aches.   Chest congestion.   Chills.   Shortness of breath.   Sore throat.  DIAGNOSIS  Acute bronchitis is usually diagnosed through a physical exam. Your health care provider will also ask you questions about your medical history. Tests, such as chest X-rays, are sometimes done to rule out other conditions.  TREATMENT  Acute bronchitis usually goes away in a couple weeks. Oftentimes, no medical treatment is necessary. Medicines are sometimes given for relief of fever or cough. Antibiotic medicines are usually not needed but may be prescribed in certain situations. In some cases, an inhaler may be recommended to help reduce shortness  of breath and control the cough. A cool mist vaporizer may also be used to help thin bronchial secretions and make it easier to clear the chest.  HOME CARE INSTRUCTIONS  Get plenty of rest.   Drink enough fluids to keep your urine clear or pale yellow (unless you have a medical condition that requires fluid restriction). Increasing fluids may help thin your respiratory secretions (sputum) and reduce chest congestion, and it will prevent dehydration.   Take medicines only as directed by your health care provider.  If you were prescribed an antibiotic medicine, finish it all even if you start to feel better.  Avoid smoking and secondhand smoke. Exposure to cigarette smoke or irritating chemicals will make bronchitis worse. If you are a smoker, consider using nicotine gum or skin patches to help control withdrawal symptoms. Quitting smoking will help your lungs heal faster.   Reduce the chances of another bout of acute bronchitis by washing your hands frequently, avoiding people with cold symptoms, and trying not to touch your hands to your mouth, nose, or eyes.   Keep all follow-up visits as directed by your health care provider.  SEEK MEDICAL CARE IF: Your symptoms do not improve after 1 week of treatment.  SEEK IMMEDIATE MEDICAL CARE IF:  You develop an increased fever or chills.   You have chest pain.   You have severe shortness of breath.  You have bloody sputum.   You develop dehydration.  You faint or repeatedly feel like you are going to pass out.  You develop repeated vomiting.  You develop a severe headache. MAKE SURE  YOU:   Understand these instructions.  Will watch your condition.  Will get help right away if you are not doing well or get worse.   This information is not intended to replace advice given to you by your health care provider. Make sure you discuss any questions you have with your health care provider.   Document Released: 04/10/2004 Document  Revised: 03/24/2014 Document Reviewed: 08/24/2012 Else[employer] Interactive Patient Education [employer] un next week for recheck of your bronchitis. Please refrain from all smoking.

## 2015-06-21 NOTE — ED Provider Notes (Signed)
CSN: [id number]     Arrival date & time 06/21/15  2048 History   First MD Initiated Contact with Patient 06/21/15 2111     Chief Complaint  Patient presents with  . Cough     (Consider location/radiation/quality/duration/timing/severity/associated sxs/prior Treatment) HPI Comments: Patient is a 40 year old female who presents to the emergency department with on complaint of increasing cough.  The patient states that time she started having headache and cough this morning. She states she has been coughing violently throughout the day, has noted wheezing on. She's not had any fever or chills reported. Patient denies coughing up any blood. She states that the muscles in her chest hurting, she has increasing headache that gets worse with cough. She also has some back area pain with coughing.  Patient is a 40 y.o. female presenting with cough. The history is provided by the patient.  Cough Cough characteristics:  Non-productive, barking and hacking Associated symptoms: wheezing   Associated symptoms: no chills and no fever     Past Medical History  Diagnosis Date  . Depression   . Bipolar 1 disorder (HCC)   . Anxiety   . Abnormal Pap smear     LSIL  . Thyrotoxicosis    Past Surgical History  Procedure Laterality Date  . Tubal ligation    . Abdominal surgery    . Appendectomy     Family History  Problem Relation Age of Onset  . Cancer Maternal Grandmother   . Cancer Maternal Aunt    Social History  Substance Use Topics  . Smoking status: Current Every Day Smoker -- 1.00 packs/day  . Smokeless tobacco: None  . Alcohol Use: No   OB History    No data available     Review of Systems  Constitutional: Negative for fever and chills.  HENT: Positive for congestion.   Respiratory: Positive for cough and wheezing.        Chest wall soreness.  All other systems reviewed and are negative.     Allergies  Toradol and Adhesive  Home Medications   Prior to Admission  medications   Medication Sig Start Date End Date Taking? Authorizing Provider  albuterol (PROVENTIL HFA;VENTOLIN HFA) 108 (90 BASE) MCG/ACT inhaler Inhale 1-2 puffs into the lungs every 6 (six) hours as needed for wheezing. 12/05/14   Rolland PorterMark James, MD  albuterol (PROVENTIL) (2.5 MG/3ML) 0.083% nebulizer solution Take 3 mLs (2.5 mg total) by nebulization every 6 (six) hours as needed for wheezing or shortness of breath. 12/05/14   Rolland PorterMark James, MD  azithromycin (ZITHROMAX Z-PAK) 250 MG tablet As directed 12/05/14   Rolland PorterMark James, MD  benzonatate (TESSALON) 100 MG capsule Take 200 mg by mouth 3 (three) times daily.    Historical Provider, MD  citalopram (CELEXA) 20 MG tablet Take 20 mg by mouth at bedtime.     Historical Provider, MD  guaiFENesin-codeine 100-10 MG/5ML syrup Take 10 mLs by mouth 3 (three) times daily as needed for cough. 12/05/14   Rolland PorterMark James, MD  HYDROcodone-acetaminophen (NORCO/VICODIN) 5-325 MG per tablet Take 1 tablet by mouth every 6 (six) hours as needed. Patient not taking: Reported on 12/05/2014 09/16/14   Bethann BerkshireJoseph Zammit, MD  naproxen sodium (ALEVE) 220 MG tablet Take 440 mg by mouth daily as needed (pain).    Historical Provider, MD  nystatin (MYCOSTATIN) 100000 UNIT/ML suspension Take 5 mLs (500,000 Units total) by mouth 4 (four) times daily. 12/05/14   Rolland PorterMark James, MD  predniSONE (DELTASONE) 20 MG tablet Take 40  mg by mouth daily with breakfast.    Historical Provider, MD  traZODone (DESYREL) 150 MG tablet Take 300 mg by mouth at bedtime.     Historical Provider, MD   BP 101/65 mmHg  Pulse 82  Temp(Src) 98.2 F (36.8 C) (Oral)  Resp 16  Ht  (1.575 m)  Wt 90.266 kg  BMI 36.39 kg/m2  SpO2 97%  LMP 06/16/2015 Physical Exam  Constitutional: She is oriented to person, place, and time. She appears well-developed and well-nourished.  Non-toxic appearance.  HENT:  Head: Normocephalic.  Right Ear: Tympanic membrane and external ear normal.  Left Ear: Tympanic membrane and external  ear normal.  Eyes: EOM and lids are normal. Pupils are equal, round, and reactive to light.  Neck: Normal range of motion. Neck supple. Carotid bruit is not present.  Cardiovascular: Normal rate, regular rhythm, normal heart sounds, intact distal pulses and normal pulses.   Pulmonary/Chest: No accessory muscle usage. No respiratory distress. She has wheezes. She has rhonchi.    Patient speaks in complete sentences without problem. There is symmetrical rise and fall of the chest.  Abdominal: Soft. Bowel sounds are normal. There is no tenderness. There is no guarding.  Musculoskeletal: Normal range of motion.  Lymphadenopathy:       Head (right side): No submandibular adenopathy present.       Head (left side): No submandibular adenopathy present.    She has no cervical adenopathy.  Neurological: She is alert and oriented to person, place, and time. She has normal strength. No cranial nerve deficit or sensory deficit.  Skin: Skin is warm and dry.  Psychiatric: She has a normal mood and affect. Her speech is normal.  Nursing note and vitals reviewed.   ED Course  Procedures (including critical care time) Labs Review Labs Reviewed - No data to display  Imaging Review No results found. I have personally reviewed and evaluated these images and lab results as part of my medical decision-making.   EKG Interpretation None      MDM  Pulse ox is 97% on room air. Pt ambulatory to BR without problem. Pt speaks in complete sentences. The Chest xray is negative for acute problem. The exam favors acute bronchitis. Pt will be treated with albuterol inhaler, decadron, promethazine DM, afrin, and doxycycline. Pt to follow up with PCP next week.   Final diagnoses:  None    *I have reviewed nursing notes, vital signs, and all appropriate lab and imaging results for this patient.    Ivery Quale, PA-C 06/21/15 2242  Lavera Guise, MD 06/22/15 [phone]

## 2015-12-04 ENCOUNTER — Emergency Department (HOSPITAL_COMMUNITY): Payer: BLUE CROSS/BLUE SHIELD

## 2015-12-04 ENCOUNTER — Encounter (HOSPITAL_COMMUNITY): Payer: Self-pay | Admitting: Emergency Medicine

## 2015-12-04 ENCOUNTER — Emergency Department (HOSPITAL_COMMUNITY)
Admission: EM | Admit: 2015-12-04 | Discharge: 2015-12-04 | Disposition: A | Discharge status: Home / Self Care | Payer: BLUE CROSS/BLUE SHIELD | Attending: Emergency Medicine | Admitting: Emergency Medicine

## 2015-12-04 DIAGNOSIS — Z7982 Long term (current) use of aspirin: Secondary | ICD-10-CM | POA: Diagnosis not present

## 2015-12-04 DIAGNOSIS — Z79899 Other long term (current) drug therapy: Secondary | ICD-10-CM | POA: Diagnosis not present

## 2015-12-04 DIAGNOSIS — F17200 Nicotine dependence, unspecified, uncomplicated: Secondary | ICD-10-CM | POA: Insufficient documentation

## 2015-12-04 DIAGNOSIS — R109 Unspecified abdominal pain: Secondary | ICD-10-CM

## 2015-12-04 DIAGNOSIS — R1032 Left lower quadrant pain: Secondary | ICD-10-CM

## 2015-12-04 DIAGNOSIS — N83202 Unspecified ovarian cyst, left side: Secondary | ICD-10-CM | POA: Diagnosis not present

## 2015-12-04 DIAGNOSIS — N838 Other noninflammatory disorders of ovary, fallopian tube and broad ligament: Secondary | ICD-10-CM

## 2015-12-04 DIAGNOSIS — R05 Cough: Secondary | ICD-10-CM | POA: Diagnosis not present

## 2015-12-04 HISTORY — DX: Unspecified viral hepatitis C without hepatic coma: B19.20

## 2015-12-04 LAB — URINE MICROSCOPIC-ADD ON: WBC UA: NONE SEEN WBC/hpf (ref 0–5)

## 2015-12-04 LAB — WET PREP, GENITAL
Clue Cells Wet Prep HPF POC: NONE SEEN
Sperm: NONE SEEN
Trich, Wet Prep: NONE SEEN
Yeast Wet Prep HPF POC: NONE SEEN

## 2015-12-04 LAB — URINALYSIS, ROUTINE W REFLEX MICROSCOPIC
Bilirubin Urine: NEGATIVE
Glucose, UA: NEGATIVE mg/dL
LEUKOCYTES UA: NEGATIVE
Nitrite: NEGATIVE
PROTEIN: NEGATIVE mg/dL
SPECIFIC GRAVITY, URINE: 1.02 (ref 1.005–1.030)
pH: 6 (ref 5.0–8.0)

## 2015-12-04 LAB — PREGNANCY, URINE: Preg Test, Ur: NEGATIVE

## 2015-12-04 MED ORDER — HYDROCODONE-ACETAMINOPHEN 5-325 MG PO TABS: 1.0000 | ORAL_TABLET | ORAL | 0 refills | Status: DC | PRN

## 2015-12-04 MED ORDER — ALBUTEROL SULFATE (2.5 MG/3ML) 0.083% IN NEBU
2.5000 mg | INHALATION_SOLUTION | Freq: Once | RESPIRATORY_TRACT | Status: AC
  Administered 2015-12-04: 2.5 mg via RESPIRATORY_TRACT
  Filled 2015-12-04: qty 3

## 2015-12-04 MED ORDER — PROMETHAZINE HCL 25 MG PO TABS: 25.0000 mg | ORAL_TABLET | Freq: Four times a day (QID) | ORAL | 0 refills | Status: DC | PRN

## 2015-12-04 MED ORDER — DICLOFENAC SODIUM 75 MG PO TBEC: 75.0000 mg | DELAYED_RELEASE_TABLET | Freq: Two times a day (BID) | ORAL | 0 refills | Status: DC

## 2015-12-04 MED ORDER — GADOBENATE DIMEGLUMINE 529 MG/ML IV SOLN
18.0000 mL | Freq: Once | INTRAVENOUS | Status: AC | PRN
  Administered 2015-12-04: 18 mL via INTRAVENOUS

## 2015-12-04 MED ORDER — HYDROMORPHONE HCL 1 MG/ML IJ SOLN
1.0000 mg | Freq: Once | INTRAMUSCULAR | Status: AC
Start: 2015-12-04 — End: 2015-12-04
  Administered 2015-12-04: 1 mg via INTRAVENOUS
  Filled 2015-12-04: qty 1

## 2015-12-04 MED ORDER — PROCHLORPERAZINE EDISYLATE 5 MG/ML IJ SOLN
5.0000 mg | Freq: Once | INTRAMUSCULAR | Status: AC
  Administered 2015-12-04: 5 mg via INTRAVENOUS
  Filled 2015-12-04: qty 2

## 2015-12-04 MED ORDER — ALBUTEROL SULFATE HFA 108 (90 BASE) MCG/ACT IN AERS: 2.0000 | INHALATION_SPRAY | Freq: Once | RESPIRATORY_TRACT | Status: DC

## 2015-12-04 MED ORDER — HYDROMORPHONE HCL 1 MG/ML IJ SOLN
1.0000 mg | Freq: Once | INTRAMUSCULAR | Status: AC
  Administered 2015-12-04: 1 mg via INTRAVENOUS
  Filled 2015-12-04: qty 1

## 2015-12-04 MED ORDER — DEXAMETHASONE SODIUM PHOSPHATE 4 MG/ML IJ SOLN
8.0000 mg | Freq: Once | INTRAMUSCULAR | Status: AC
  Administered 2015-12-04: 8 mg via INTRAVENOUS
  Filled 2015-12-04: qty 2

## 2015-12-04 MED ORDER — HYDROMORPHONE HCL 1 MG/ML IJ SOLN
0.5000 mg | Freq: Once | INTRAMUSCULAR | Status: AC
  Administered 2015-12-04: 0.5 mg via INTRAVENOUS
  Filled 2015-12-04: qty 1

## 2015-12-04 MED ORDER — ONDANSETRON HCL 4 MG/2ML IJ SOLN
4.0000 mg | Freq: Once | INTRAMUSCULAR | Status: AC
  Administered 2015-12-04: 4 mg via INTRAVENOUS
  Filled 2015-12-04: qty 2

## 2015-12-04 MED ORDER — DIAZEPAM 5 MG/ML IJ SOLN
7.5000 mg | Freq: Once | INTRAMUSCULAR | Status: AC
  Administered 2015-12-04: 7.5 mg via INTRAVENOUS
  Filled 2015-12-04: qty 2

## 2015-12-04 NOTE — ED Notes (Addendum)
Pt reports left side pain and requesting pain medication. EDP notified

## 2015-12-04 NOTE — ED Notes (Signed)
Pt transported to US

## 2015-12-04 NOTE — ED Notes (Signed)
Pt returned from MRI °

## 2015-12-04 NOTE — Discharge Instructions (Signed)
Your MRI suggest a benign appearing cyst of the left ovary. There is a small cysts also on the right ovary. Please see Dr. Emelda FearFerguson, or the GYN specialist of your choice for additional evaluation of the cyst. Please use diclofenac 2 times daily with food. May use Norco for pain if needed.

## 2015-12-04 NOTE — ED Triage Notes (Signed)
Pt states that LLQ is hurting her. Pt states that she has been sick with bronchitis and has been cough a lot and thought it was just pain from pulled muscle. Pt also states that she has lower left sided back pain and it hurts when she pees. Pt is able to drink fluids without difficulty.

## 2015-12-04 NOTE — ED Notes (Signed)
Patient transported to MRI 

## 2015-12-04 NOTE — ED Notes (Signed)
Pt continually ringing the call light. Becoming frustrated due to length of time. Barbara Simon notified will be in to update pt on results

## 2015-12-04 NOTE — ED Provider Notes (Signed)
[facility]-EMER[facility]ENCY DEPT Provider Note   CSN: [id number] Arrival date & time: 12/04/15  0805     History   Chief Complaint Chief Complaint  Patient presents with  . Flank Pain    HPI Barbara Simon is a 40 y.o. female.  Patient is a 40 year old female who presents to the emergency department with a complaint of left lower quadrant area pain.  The patient states that she's been dealing with bronchitis over the last several weeks. She felt that she probably pulled a muscle, but she states the pain keeps getting progressively worse. She states that when she sits down to urinate, she has pain in her back and in her left lower quadrant. She does not have any burning sensation or pain with urination, but has pain when she sits to  relax to urinate. She's not had any injury or trauma to his left lower side. She has not had any recent operations or procedures involving the left lower quadrant. There's been no blood in the urine. There's been no blood in stools. The patient states at times the pain is unbearable.      Past Medical History:  Diagnosis Date  . Abnormal Pap smear    LSIL  . Anxiety   . Bipolar 1 disorder (HCC)   . Depression   . Hepatitis C    two years ago was dx  . Thyrotoxicosis     Patient Active Problem List   Diagnosis Date Noted  . HPV test positive 03/29/2013  . Palpitations 02/15/2013  . Chronic hepatitis C without mention of hepatic coma 02/15/2013  . Depressive disorder, not elsewhere classified 02/03/2013  . Hyperthyroidism 02/01/2013    Past Surgical History:  Procedure Laterality Date  . ABDOMINAL SURGERY    . APPENDECTOMY    . TUBAL LIGATION      OB History    No data available       Home Medications    Prior to Admission medications   Medication Sig Start Date End Date Taking? Authorizing Provider  aspirin-acetaminophen-caffeine (EXCEDRIN MIGRAINE) [phone] MG tablet Take 2 tablets by mouth every 6 (six) hours as needed for headache.    Yes Historical Provider, MD  Cholecalciferol (VITAMIN D3) 5000 units CAPS Take 1 capsule by mouth daily.   Yes Historical Provider, MD  citalopram (CELEXA) 20 MG tablet Take 40 mg by mouth at bedtime.    Yes Historical Provider, MD  traZODone (DESYREL) 150 MG tablet Take 300 mg by mouth at bedtime.    Yes Historical Provider, MD    Family History Family History  Problem Relation Age of Onset  . Cancer Maternal Grandmother   . Cancer Maternal Aunt     Social History Social History  Substance Use Topics  . Smoking status: Current Every Day Smoker    Packs/day: 1.00  . Smokeless tobacco: Never Used  . Alcohol use No     Allergies   Toradol [ketorolac tromethamine] and Adhesive [tape]   Review of Systems Review of Systems  Constitutional: Positive for activity change.  Respiratory: Positive for cough.   Gastrointestinal: Positive for abdominal pain.  Genitourinary: Positive for pelvic pain.  Psychiatric/Behavioral:       Bipolar illness  All other systems reviewed and are negative.    Physical Exam Updated Vital Signs BP 93/56 (BP Location: Right Arm)   Pulse (!) 59   Temp 97.9 F (36.6 C) (Oral)   Resp 15   Ht 5\' 3"  (1.6 m)   Wt 86.2  kg   LMP 11/20/2015 Comment: two weeks ago  SpO2 94%   BMI 33.66 kg/m   Physical Exam  Constitutional: Vital signs are normal. She appears well-developed and well-nourished. She is active.  HENT:  Head: Normocephalic and atraumatic.  Right Ear: Tympanic membrane, external ear and ear canal normal.  Left Ear: Tympanic membrane, external ear and ear canal normal.  Nose: Nose normal.  Mouth/Throat: Uvula is midline, oropharynx is clear and moist and mucous membranes are normal.  Eyes: Conjunctivae, EOM and lids are normal. Pupils are equal, round, and reactive to light.  Neck: Trachea normal, normal range of motion and phonation normal. Neck supple. Carotid bruit is not present.  Cardiovascular: Normal rate, regular rhythm and  normal pulses.   Pulmonary/Chest:  There is symmetrical rise and fall of the chest. The patient speaks in complete sentences without problem. There are few scattered rhonchi present.  Abdominal: Soft. Normal appearance and bowel sounds are normal.  Genitourinary:  Genitourinary Comments: Chaperone present during the examination. The external structures of the pubis and vulva are within normal limits. There is no foreign body noted in the vaginal vault. The cervix is slightly inverted. The os of the cervix is closed. There is no right adnexal tenderness or swelling. There is significant left adnexal tenderness, and questionable fullness present.    Musculoskeletal: Normal range of motion.  Lymphadenopathy:       Head (right side): No submental, no preauricular and no posterior auricular adenopathy present.       Head (left side): No submental, no preauricular and no posterior auricular adenopathy present.    She has no cervical adenopathy.  Neurological: She is alert. She has normal strength. No cranial nerve deficit or sensory deficit. GCS eye subscore is 4. GCS verbal subscore is 5. GCS motor subscore is 6.  Skin: Skin is warm and dry.  Psychiatric: Her speech is normal.     ED Treatments / Results  Labs (all labs ordered are listed, but only abnormal results are displayed) Labs Reviewed  URINALYSIS, ROUTINE W REFLEX MICROSCOPIC (NOT AT [facility]) - Abnormal; Notable for the following:       Result Value   Hgb urine dipstick TRACE (*)    Ketones, ur TRACE (*)    All other components within normal limits  URINE MICROSCOPIC-ADD ON - Abnormal; Notable for the following:    Squamous Epithelial / LPF 0-5 (*)    Bacteria, UA FEW (*)    All other components within normal limits  WET PREP, GENITAL  PREGNANCY, URINE  GC/CHLAMYDIA PROBE AMP ([facility]) NOT AT [facility]    EKG  EKG Interpretation None       Radiology Dg Chest 2 View  Result Date: 12/04/2015 CLINICAL DATA:  Nonproductive  cough x1 week EXAM: CHEST  2 VIEW COMPARISON:  11/10/2015 FINDINGS: Lungs are clear.  No pleural effusion or pneumothorax. The heart is normal in size. Mild degenerative changes of the visualized thoracolumbar spine. IMPRESSION: No evidence of acute cardiopulmonary disease. Electronically Signed   By: Charline Bills M.D.   On: 12/04/2015 09:46    Procedures Procedures (including critical care time)  Medications Ordered in ED Medications  albuterol (PROVENTIL HFA;VENTOLIN HFA) 108 (90 Base) MCG/ACT inhaler 2 puff (not administered)  HYDROmorphone (DILAUDID) injection 0.5 mg (0.5 mg Intravenous Given 12/04/15 0914)  ondansetron (ZOFRAN) injection 4 mg (4 mg Intravenous Given 12/04/15 0914)  albuterol (PROVENTIL) (2.5 MG/3ML) 0.083% nebulizer solution 2.5 mg (2.5 mg Nebulization Given by Other 12/04/15  1055)  dexamethasone (DECADRON) injection 8 mg (8 mg Intravenous Given 12/04/15 1053)  HYDROmorphone (DILAUDID) injection 1 mg (1 mg Intravenous Given 12/04/15 1054)     Initial Impression / Assessment and Plan / ED Course  Patient seen with me by Dr. Eudelia Bunchardama. Urine preg and CT stone study ordered.  I have reviewed the triage vital signs and the nursing notes.  Pertinent labs & imaging results that were available during my care of the patient were reviewed by me and considered in my medical decision making (see chart for details).  Clinical Course    **I have reviewed nursing notes, vital signs, and all appropriate lab and imaging results for this patient.*  Final Clinical Impressions(s) / ED Diagnoses Vital signs within normal limits.  Urinalysis nonacute. Wet prep is negative.   Transvaginal ultrasound reveals a mass of the left ovary, with significant solid component. MRI suggested.  MRI reveals a 3.1 cm benign-appearing cyst that appears to be physiologic on the left. There is also a 1.3 cm hemorrhagic cyst on the right.  I discussed the findings with the patient in terms which he  understands. The plan at this time is for the patient to be treated with diclofenac 2 times daily. She'll be given a prescription for Norco for more severe pain. She is to follow-up with her GYN physician concerning the ovarian cyst.    Final diagnoses:  Left flank pain    New Prescriptions New Prescriptions   No medications on file     Ivery QualeHobson Giovan Pinsky, PA-C 12/04/15 1741    Nira ConnPedro Eduardo Cardama, MD 12/06/15 1047

## 2015-12-05 LAB — GC/CHLAMYDIA PROBE AMP (~~LOC~~) NOT AT ARMC
CHLAMYDIA, DNA PROBE: NEGATIVE
NEISSERIA GONORRHEA: NEGATIVE

## 2015-12-14 ENCOUNTER — Encounter: Payer: Self-pay | Admitting: Obstetrics and Gynecology

## 2015-12-20 ENCOUNTER — Ambulatory Visit (INDEPENDENT_AMBULATORY_CARE_PROVIDER_SITE_OTHER): Payer: BLUE CROSS/BLUE SHIELD | Admitting: Advanced Practice Midwife

## 2015-12-20 ENCOUNTER — Encounter: Payer: Self-pay | Admitting: Advanced Practice Midwife

## 2015-12-20 VITALS — BP 112/78 | HR 72 | Wt 191.0 lb

## 2015-12-20 DIAGNOSIS — N83202 Unspecified ovarian cyst, left side: Secondary | ICD-10-CM | POA: Diagnosis not present

## 2015-12-20 DIAGNOSIS — F1190 Opioid use, unspecified, uncomplicated: Secondary | ICD-10-CM | POA: Insufficient documentation

## 2015-12-20 DIAGNOSIS — F1120 Opioid dependence, uncomplicated: Secondary | ICD-10-CM

## 2015-12-20 MED ORDER — TRAMADOL HCL 50 MG PO TABS: 50.0000 mg | ORAL_TABLET | Freq: Four times a day (QID) | ORAL | 0 refills | Status: DC | PRN

## 2015-12-20 MED ORDER — MEGESTROL ACETATE 40 MG PO TABS: 40.0000 mg | ORAL_TABLET | Freq: Every day | ORAL | 1 refills | Status: DC

## 2015-12-20 NOTE — Progress Notes (Signed)
[facility] Clinic Visit  Patient name: Barbara Simon MRN [id number]  Date of birth: [birth year]  CC & HPI:  Barbara Simon is a 40 y.o. Caucasian female presenting today for F/U ED visit.  She has had LLQ intermittent pain since 9/17.  Was dx w/4cm ovarian cyst.  She has had a known cyst q year for the past 3-4 years. Never had to have surgery.  A chart review reveals hx of opiate addiction since at least 2002.  Has been on and off percocet and xanax.  Started suboxone in August.  Today (prior to chart review) pt states that she "doesn't have a problem with addiction, but I have chronic pain and decided that suboxone would be better than percocet because I have a family hx of  addiction.".   Discussed that Tramadol is a narcotic with less addiction potential than percocet, but warned pt against taking it to avoid triggering cravings.  Pt states that NSAIDS cause rectal bleeding.PT INFORMED THAT THIS WILL BE HER ONLY RX FOR TRAMADOL (#60) Problem list updated  Pertinent History Reviewed:  Medical & Surgical Hx:   Past Medical History:  Diagnosis Date  . Abnormal Pap smear    LSIL  . Anxiety   . Bipolar 1 disorder (HCC)   . Depression   . Hepatitis C    two years ago was dx  . Ovarian cyst   . Thyrotoxicosis    Past Surgical History:  Procedure Laterality Date  . ABDOMINAL SURGERY    . APPENDECTOMY    . TUBAL LIGATION     Family History  Problem Relation Age of Onset  . Cancer Maternal Grandmother   . Cancer Maternal Aunt     Current Outpatient Prescriptions:  .  aspirin-acetaminophen-caffeine (EXCEDRIN MIGRAINE) 250-250-65 MG tablet, Take 2 tablets by mouth every 6 (six) hours as needed for headache., Disp: , Rfl:  .  citalopram (CELEXA) 40 MG tablet, , Disp: , Rfl: 0 .  clonazePAM (KLONOPIN) 0.5 MG tablet, , Disp: , Rfl: 0 .  SUBOXONE 12-3 MG FILM, , Disp: , Rfl: 0 .  Cholecalciferol (VITAMIN D3) 5000 units CAPS, Take 1 capsule by mouth daily., Disp: , Rfl:  .   citalopram (CELEXA) 20 MG tablet, Take 40 mg by mouth at bedtime. , Disp: , Rfl:  .  diclofenac (VOLTAREN) 75 MG EC tablet, Take 1 tablet (75 mg total) by mouth 2 (two) times daily. (Patient not taking: Reported on 12/20/2015), Disp: 14 tablet, Rfl: 0 .  HYDROcodone-acetaminophen (NORCO/VICODIN) 5-325 MG tablet, Take 1 tablet by mouth every 4 (four) hours as needed. (Patient not taking: Reported on 12/20/2015), Disp: 15 tablet, Rfl: 0 .  megestrol (MEGACE) 40 MG tablet, Take 1 tablet (40 mg total) by mouth daily., Disp: 40 tablet, Rfl: 1 .  promethazine (PHENERGAN) 25 MG tablet, Take 1 tablet (25 mg total) by mouth every 6 (six) hours as needed for nausea or vomiting. (Patient not taking: Reported on 12/20/2015), Disp: 10 tablet, Rfl: 0 .  traMADol (ULTRAM) 50 MG tablet, Take 1 tablet (50 mg total) by mouth every 6 (six) hours as needed., Disp: 60 tablet, Rfl: 0 .  traZODone (DESYREL) 150 MG tablet, Take 300 mg by mouth at bedtime. , Disp: , Rfl:  Social History: Reviewed -  reports that she has been smoking.  She has been smoking about 1.00 pack per day. She has never used smokeless tobacco.  Review of Systems:   Constitutional: Negative for fever and chills Eyes: Negative  for visual disturbances Respiratory: Negative for shortness of breath, dyspnea Cardiovascular: Negative for chest pain or palpitations  Gastrointestinal: Negative for vomiting, diarrhea and constipation;  Genitourinary: Negative for dysuria and urgency, vaginal irritation or itching Musculoskeletal: Negative for back pain, joint pain, myalgias  Neurological: Negative for dizziness and headaches    Objective Findings:    Physical Examination: General appearance - well appearing, and in no distress Mental status - alert, oriented to person, place, and time Chest:  Normal respiratory effort Heart - normal rate and regular rhythm Abdomen:  Tender in LLQ Musculoskeletal:  Normal range of motion without pain Extremities:  No  edema   US: Measurements: 4.1 x 3.7 x 3.4 cm. 2.9 cm complex mass with significant solid component is noted. Doppler demonstrates color flow around this lesion, but not within it.  Other findings  No abnormal free fluid.  IMPRESSION: 2.9 cm complex mass with significant solid component is noted in left ovary, but no color flow is noted within the lesion. MRI is recommended for further evaluation to rule out possible neoplasm.  MRI:  Left  ovary: 3.1 cm mildly complex cyst containing dependent T2 hypointense material. This lesion shows no evidence of enhancing internal septations or mural nodularity on postcontrast imaging, and is consistent with benign cyst. Tubal ligation clip artifact noted.  No results found for this or any previous visit (from the past 24 hour(s)).    Assessment & Plan:  A:   Left ovarian cyst P:  Dr Despina HiddenEure reviewed scans:    Return in about 6 weeks (around 01/31/2016) for pelvic US and visit with DR. Eure afterwards .  CRESENZO-DISHMAN,Ocie Stanzione CNM 12/20/2015 2:47 PM

## 2016-01-22 ENCOUNTER — Other Ambulatory Visit: Payer: Self-pay | Admitting: Advanced Practice Midwife

## 2016-01-22 DIAGNOSIS — N83202 Unspecified ovarian cyst, left side: Secondary | ICD-10-CM

## 2016-01-31 ENCOUNTER — Ambulatory Visit: Payer: BLUE CROSS/BLUE SHIELD | Admitting: Obstetrics & Gynecology

## 2016-01-31 ENCOUNTER — Other Ambulatory Visit: Payer: BLUE CROSS/BLUE SHIELD

## 2016-02-09 ENCOUNTER — Emergency Department (HOSPITAL_COMMUNITY): Payer: BLUE CROSS/BLUE SHIELD

## 2016-02-09 ENCOUNTER — Encounter (HOSPITAL_COMMUNITY): Payer: Self-pay | Admitting: Emergency Medicine

## 2016-02-09 ENCOUNTER — Emergency Department (HOSPITAL_COMMUNITY)
Admission: EM | Admit: 2016-02-09 | Discharge: 2016-02-09 | Disposition: A | Discharge status: Home / Self Care | Payer: BLUE CROSS/BLUE SHIELD | Attending: Emergency Medicine | Admitting: Emergency Medicine

## 2016-02-09 DIAGNOSIS — S00522A Blister (nonthermal) of oral cavity, initial encounter: Secondary | ICD-10-CM | POA: Diagnosis not present

## 2016-02-09 DIAGNOSIS — Y9389 Activity, other specified: Secondary | ICD-10-CM | POA: Diagnosis not present

## 2016-02-09 DIAGNOSIS — S60511A Abrasion of right hand, initial encounter: Secondary | ICD-10-CM | POA: Insufficient documentation

## 2016-02-09 DIAGNOSIS — W2201XA Walked into wall, initial encounter: Secondary | ICD-10-CM | POA: Diagnosis not present

## 2016-02-09 DIAGNOSIS — Z79899 Other long term (current) drug therapy: Secondary | ICD-10-CM | POA: Diagnosis not present

## 2016-02-09 DIAGNOSIS — Y999 Unspecified external cause status: Secondary | ICD-10-CM | POA: Insufficient documentation

## 2016-02-09 DIAGNOSIS — Z7982 Long term (current) use of aspirin: Secondary | ICD-10-CM | POA: Diagnosis not present

## 2016-02-09 DIAGNOSIS — Y929 Unspecified place or not applicable: Secondary | ICD-10-CM | POA: Insufficient documentation

## 2016-02-09 DIAGNOSIS — F17200 Nicotine dependence, unspecified, uncomplicated: Secondary | ICD-10-CM | POA: Insufficient documentation

## 2016-02-09 DIAGNOSIS — S6991XA Unspecified injury of right wrist, hand and finger(s), initial encounter: Secondary | ICD-10-CM | POA: Diagnosis present

## 2016-02-09 DIAGNOSIS — M779 Enthesopathy, unspecified: Secondary | ICD-10-CM | POA: Insufficient documentation

## 2016-02-09 MED ORDER — PROMETHAZINE HCL 12.5 MG PO TABS
12.5000 mg | ORAL_TABLET | Freq: Once | ORAL | Status: AC
  Administered 2016-02-09: 12.5 mg via ORAL
  Filled 2016-02-09: qty 1

## 2016-02-09 MED ORDER — DEXAMETHASONE 4 MG PO TABS: 4.0000 mg | ORAL_TABLET | Freq: Two times a day (BID) | ORAL | 0 refills | Status: DC

## 2016-02-09 MED ORDER — IBUPROFEN 600 MG PO TABS: 600.0000 mg | ORAL_TABLET | Freq: Four times a day (QID) | ORAL | 0 refills | Status: DC | PRN

## 2016-02-09 MED ORDER — PREDNISONE 20 MG PO TABS
40.0000 mg | ORAL_TABLET | Freq: Once | ORAL | Status: AC
  Administered 2016-02-09: 40 mg via ORAL
  Filled 2016-02-09: qty 2

## 2016-02-09 MED ORDER — IBUPROFEN 800 MG PO TABS
800.0000 mg | ORAL_TABLET | Freq: Once | ORAL | Status: AC
  Administered 2016-02-09: 800 mg via ORAL
  Filled 2016-02-09: qty 1

## 2016-02-09 NOTE — ED Notes (Signed)
Patient transported to X-ray 

## 2016-02-09 NOTE — ED Triage Notes (Signed)
Pt reports blister on under side of tongue since yesterday and also has bruising to posterior right hand.  Pt denies injury.  Pt in past has punched walls and "was rough" with it.  Pt has carpal tunnel.
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
## 2016-02-09 NOTE — Discharge Instructions (Signed)
Your x-ray is negative for fracture or dislocation. Your examination suggest tendinitis. Please use the splint for the next 7-10 days. Please use ibuprofen and Decadron. Please see Dr. Romeo AppleHarrison for orthopedic evaluation if this is not improving.  You have a blister that appears to be related to an upper respiratory infection. Please use Chloraseptic Spray on and salt water gargles to this area. Please wash hands frequently. Please do not allow anyone to share your eating utensils as this is contagious.

## 2016-02-27 ENCOUNTER — Ambulatory Visit (HOSPITAL_COMMUNITY)
Admission: RE | Admit: 2016-02-27 | Discharge: 2016-02-27 | Disposition: A | Discharge status: Home / Self Care | Payer: BLUE CROSS/BLUE SHIELD | Source: Ambulatory Visit | Attending: Internal Medicine | Admitting: Internal Medicine

## 2016-02-27 ENCOUNTER — Other Ambulatory Visit (HOSPITAL_COMMUNITY): Payer: Self-pay | Admitting: Internal Medicine

## 2016-02-27 DIAGNOSIS — R05 Cough: Secondary | ICD-10-CM | POA: Diagnosis present

## 2016-02-27 DIAGNOSIS — R0602 Shortness of breath: Secondary | ICD-10-CM

## 2016-02-27 DIAGNOSIS — R059 Cough, unspecified: Secondary | ICD-10-CM
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
## 2020-07-19 ENCOUNTER — Telehealth: Payer: Self-pay

## 2020-07-19 NOTE — Telephone Encounter (Signed)
Patient given first available OUD appt for 5/10 at 0845. She is very Adult nurse.

## 2020-07-19 NOTE — Telephone Encounter (Signed)
OUD Intake:  Drug of choice: "Pain pills"  How long: 20 years  Other substances: "Smoked crack, snorted cocaine, and heroin IV a couple of times"  Currently on suboxone: No  Interested in treatment: Yes  Around users.: No, released from prison 3 weeks ago. Does not want to go back so will not be around other users.  Social support: "Yes, family and [employer] is very supportive."   Other: Patient is currently working. She has been taking OTC ibuprofen and goody powders for bilat carpal tunnel, tendonitis, and back pain. States these are not effective and knows they will harm her long term.  Will forward to OUD Attending Pool for consideration of OUD appt. Kinnie Feil, BSN, RN-BC

## 2020-07-19 NOTE — Telephone Encounter (Signed)
Requesting an appt for new pt OUD. Please call pt back.  

## 2020-07-19 NOTE — Telephone Encounter (Signed)
Sure, OUD appointment sounds reasonable. Thanks.

## 2020-07-24 ENCOUNTER — Ambulatory Visit (INDEPENDENT_AMBULATORY_CARE_PROVIDER_SITE_OTHER): Payer: Self-pay | Admitting: Internal Medicine

## 2020-07-24 ENCOUNTER — Encounter: Payer: Self-pay | Admitting: Internal Medicine

## 2020-07-24 ENCOUNTER — Other Ambulatory Visit (HOSPITAL_COMMUNITY): Payer: Self-pay

## 2020-07-24 ENCOUNTER — Other Ambulatory Visit: Payer: Self-pay

## 2020-07-24 DIAGNOSIS — F3181 Bipolar II disorder: Secondary | ICD-10-CM | POA: Insufficient documentation

## 2020-07-24 DIAGNOSIS — M25531 Pain in right wrist: Secondary | ICD-10-CM

## 2020-07-24 DIAGNOSIS — G6289 Other specified polyneuropathies: Secondary | ICD-10-CM

## 2020-07-24 DIAGNOSIS — M25532 Pain in left wrist: Secondary | ICD-10-CM

## 2020-07-24 DIAGNOSIS — F319 Bipolar disorder, unspecified: Secondary | ICD-10-CM | POA: Insufficient documentation

## 2020-07-24 DIAGNOSIS — G5603 Carpal tunnel syndrome, bilateral upper limbs: Secondary | ICD-10-CM | POA: Insufficient documentation

## 2020-07-24 DIAGNOSIS — F1190 Opioid use, unspecified, uncomplicated: Secondary | ICD-10-CM

## 2020-07-24 DIAGNOSIS — G629 Polyneuropathy, unspecified: Secondary | ICD-10-CM | POA: Insufficient documentation

## 2020-07-24 DIAGNOSIS — Z8619 Personal history of other infectious and parasitic diseases: Secondary | ICD-10-CM

## 2020-07-24 MED ORDER — BUPRENORPHINE HCL-NALOXONE HCL 8-2 MG SL SUBL
1.0000 | SUBLINGUAL_TABLET | Freq: Every day | SUBLINGUAL | 0 refills | Status: DC
  Filled 2020-07-24: qty 14, 14d supply, fill #0

## 2020-07-24 NOTE — Assessment & Plan Note (Addendum)
Patient with 20-year history of illicit substance use with last use approximately 4 years ago prior to entering the [facility].  She endorses history of both intranasal and intravenous opioid use.  Per DSM-V guidelines, patient meets criteria for moderate opioid use disorder and is appropriate for MAT.   She has recently used Suboxone approximately 1 day ago from unprescribed sources due to occasional cravings and persistent joint pain.  Given that patient is not currently opioid dependent though, will start with once a day dosing of Suboxone and titrate accordingly.  - Suboxone 8-2 mg 1 tablet daily - Tox assure - 2-week follow-up

## 2020-07-24 NOTE — Assessment & Plan Note (Signed)
Patient endorses a several year history of bilateral hand numbness that occurs occasionally with wrist pain.  She states in the last year, she developed bilateral tingling of her distal point of the big toes.  She is concerned that this is neuropathy notes that she has a strong family history of type 2 diabetes.  She does not know of any vitamin B12 deficiency.  Per chart review, patient has a history of resolved hyperthyroidism of unknown etiology.   On examination, there is no gross musculoskeletal abnormalities and sensation exam is normal.  - Vitamin B12, A1c and TSH ordered for future once patient's health insurance begins

## 2020-07-24 NOTE — Progress Notes (Signed)
[facility] Medicine Clinic Attending  I saw and evaluated the patient.  I personally confirmed the key portions of the history and exam documented by Dr. Huel Cote and I reviewed pertinent patient test results.  The assessment, diagnosis, and plan were formulated together and I agree with the documentation in the resident's note.   45 year old person with long history of opioid use disorder, good candidate for indefinite suboxone therapy to reduce cravings and lower risk of future injection drug use and overdose. We talked about the way to take suboxone. She has no dependence, so low risk for precipitated withdrawal. Plan to start with 8mg  daily, generic tablet. She is getting insurance coverage next month, so can defer the more advanced labs until then. Follow up with in 2 weeks.

## 2020-07-24 NOTE — Assessment & Plan Note (Addendum)
Ms. Marcelle Overlie has a PMHx of Hepatitis C, genotype 3a, diagnosed in 2014. She underwent treatment with Ribavirin and Solvaldi in 2015 with 24 week course.  Patient was told afterwards that her viral loads were suppressed.  She has not had her viral loads checked in the interim, but denies any intravenous drug use during this time.  -We will defer repeating HCV viral load at this time - CMP pending

## 2020-07-24 NOTE — Assessment & Plan Note (Addendum)
Patient endorses a long-term history of bilateral wrist pain that is more concentrated on the lateral aspect, worse in the morning when she wakes up and takes several hours to improve.  Pain starts again if her wrists are kept immobile for any period of time.  Additional aggravating factors include gripping motions.  Alleviating factors include Tylenol and ibuprofen.  Previous evaluations for this concluded pain secondary to tendinitis.  She denies any history of autoimmune disorders personally or in her family.  Physical examination today is reassuring.  Differential includes osteoarthritis versus autoimmune process such as rheumatoid arthritis, however with RA I would not expect a normal examination after years of being left untreated.  - Start work-up for her neuropathy (see separate A&P)  and recommend supportive management of her wrist pain with Tylenol and topical therapy, such as Voltaren gel and IcyHot patches.
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
## 2020-07-24 NOTE — Patient Instructions (Addendum)
It was nice seeing you today! Thank you for choosing [facility] for your Primary Care.    Today we talked about:   1. Opioid Use Disorder: We are starting Suboxone treatment today. We will start with 1 tablet per day, take in the morning. We will continue to adjust this dose as needed.   2. Wrist Pain: I would recommend staying with Tylenol only for pain control. You could also try Voltaren gel or icy-hot patches to apply on top.   3. Numbness in your Toes and Hands: We will start a work up with some blood work to rule out thyroid problems, diabetes and vitamin deficiencies. When your insurance begins, call the clinic to set up a lab only appointment.   Instruction for starting buprenorphine-naloxone (Suboxone) at home  You should not mix buprenorphine-naloxone with other drugs especially large amounts of alcohol or benzodiazepines (Valium, Klonopin, Xanax, Ativan). If you have taken any of these medication, please tell your healthcare team and do not take buprenorphine-naloxone.   When it's time to take your first dose 1. Split your pill or film in half 2. Make sure your mouth is empty of everything (no candy/gum/etc) 3. Sit or stand, but do not lie down 4. Swallow a sip of water to wet your mouth  5. Put the half of the tablet or film under your tongue. Do not suck or swallow it. It must stay there until it is completely dissolved. Try to not even swallow your spit during this time. Anything that you swallow will not make you feel better.   If you have any questions or concerns at any time call your healthcare team.  Clinic Number:  830am - 5pm: [phone] After Hours Number: [phone] - - Leave your number and expect a call back from a physician.

## 2020-07-25 LAB — CMP14 + ANION GAP
ALT: 13 IU/L (ref 0–32)
AST: 22 IU/L (ref 0–40)
Albumin/Globulin Ratio: 2.1 (ref 1.2–2.2)
Albumin: 4.1 g/dL (ref 3.8–4.8)
Alkaline Phosphatase: 57 IU/L (ref 44–121)
Anion Gap: 14 mmol/L (ref 10.0–18.0)
BUN/Creatinine Ratio: 21 (ref 9–23)
BUN: 14 mg/dL (ref 6–24)
Bilirubin Total: <0.2 mg/dL (ref 0.0–1.2)
CO2: 24 mmol/L (ref 20–29)
Calcium: 9.4 mg/dL (ref 8.7–10.2)
Chloride: 103 mmol/L (ref 96–106)
Creatinine, Ser: 0.68 mg/dL (ref 0.57–1.00)
Globulin, Total: 2 g/dL (ref 1.5–4.5)
Glucose: 80 mg/dL (ref 65–99)
Potassium: 4.4 mmol/L (ref 3.5–5.2)
Sodium: 141 mmol/L (ref 134–144)
Total Protein: 6.1 g/dL (ref 6.0–8.5)
eGFR: 111 mL/min/{1.73_m2} (ref >59)

## 2020-08-01 LAB — TOXASSURE SELECT,+ANTIDEPR,UR

## 2020-08-07 ENCOUNTER — Ambulatory Visit (INDEPENDENT_AMBULATORY_CARE_PROVIDER_SITE_OTHER): Payer: Self-pay | Admitting: Internal Medicine

## 2020-08-07 ENCOUNTER — Other Ambulatory Visit (HOSPITAL_COMMUNITY): Payer: Self-pay

## 2020-08-07 VITALS — BP 109/78 | HR 84 | Temp 98.7°F | Ht 63.0 in | Wt 168.7 lb

## 2020-08-07 DIAGNOSIS — F1190 Opioid use, unspecified, uncomplicated: Secondary | ICD-10-CM

## 2020-08-07 DIAGNOSIS — G6289 Other specified polyneuropathies: Secondary | ICD-10-CM

## 2020-08-07 MED ORDER — BUPRENORPHINE HCL-NALOXONE HCL 8-2 MG SL SUBL
1.0000 | SUBLINGUAL_TABLET | Freq: Two times a day (BID) | SUBLINGUAL | 0 refills | Status: DC
  Filled 2020-08-07: qty 28, 14d supply, fill #0

## 2020-08-07 NOTE — Patient Instructions (Addendum)
It was nice seeing you today! Thank you for choosing [facility] for your Primary Care.    Today we talked about:   1. Suboxone Therapy: We increased Suboxone to twice per day. A 2 week prescription was sent to the pharmacy. Let's follow up in 2 weeks to see how you are doing. We can do this either via telephone or in person, at which time you can get the blood work.

## 2020-08-08 NOTE — Assessment & Plan Note (Addendum)
Moderate to severe opioid use disorder in remission.  She is tolerating Suboxone well, however I suspect she will need an increase in her dose given persistent pain and concern over cravings.  Patient is in agreement with this plan.  Of note, once patient's insurance is active, Barbara Simon would like to switch to films (she is currently on tablets)  - Suboxone 8-2 mg increased to twice daily - 2-week follow-up, in person or via telehealth

## 2020-08-08 NOTE — Progress Notes (Signed)
   08/08/2020  Barbara Simon presents for follow up of opioid use disorder I have reviewed the prior induction visit, follow up visits, and telephone encounters relevant to opiate use disorder (OUD) treatment.   Current daily dose: Suboxone 8-2 mg once daily  Date of Induction: 07/24/2020  Current follow up interval, in weeks: 2 weeks  The patient has been adherent with the buprenorphine for OUD contract.   Last UDS Result: 07/24/2020, as expected  HPI:   Barbara Simon states that she has been doing well since her last visit at which time Suboxone was initiated.  She is currently taking it once daily, however feels that she is still having pain throughout the day and concerns her as this is usually what triggers her cravings in the past.  She denies any relapse.  She also notes that she continues to have neuropathy in her wrists and her great toe, however her insurance has not been implemented yet so she would like to continue holding off on lab work.  Barbara Simon is excited to have started a new job that she enjoys.  She is still living with her mother and notes that they do have disagreements at times but she enjoys living there with her.  No other acute concerns today.  Exam:   Vitals:   08/07/20 1013  BP: 109/78  Pulse: 84  Temp: 98.7 F (37.1 C)  TempSrc: Oral  SpO2: 98%  Weight: 168 lb 11.2 oz (76.5 kg)  Height: 5\' 3"  (1.6 m)   Physical Exam Vitals and nursing note reviewed.  Constitutional:      General: She is not in acute distress.    Appearance: She is normal weight.  HENT:     Head: Normocephalic and atraumatic.  Eyes:     Extraocular Movements: Extraocular movements intact.     Conjunctiva/sclera: Conjunctivae normal.  Pulmonary:     Effort: Pulmonary effort is normal. No respiratory distress.  Skin:    General: Skin is warm and dry.  Neurological:     General: No focal deficit present.     Mental Status: She is alert and oriented to person, place, and  time. Mental status is at baseline.  Psychiatric:        Mood and Affect: Mood normal.        Behavior: Behavior normal.        Thought Content: Thought content normal.        Judgment: Judgment normal.    Assessment/Plan:  See Problem Based Charting in the Encounters Tab  , MD 08/08/2020  12:47 PM

## 2020-08-08 NOTE — Assessment & Plan Note (Signed)
Barbara Simon states that she continues to have neuropathy.  She would like to hold off on her lab work until June 1 at which time her health insurance will kick in.  -Vitamin B12, A1c and TSH ordered already to be obtained in the future once patient has insurance.

## 2020-08-09 NOTE — Progress Notes (Signed)
Internal Medicine Clinic Attending  Case discussed with Dr. Basaraba  At the time of the visit.  We reviewed the resident's history and exam and pertinent patient test results.  I agree with the assessment, diagnosis, and plan of care documented in the resident's note.  

## 2020-08-21 ENCOUNTER — Other Ambulatory Visit (INDEPENDENT_AMBULATORY_CARE_PROVIDER_SITE_OTHER): Payer: 59

## 2020-08-21 ENCOUNTER — Telehealth: Payer: Self-pay | Admitting: *Deleted

## 2020-08-21 ENCOUNTER — Other Ambulatory Visit: Payer: Self-pay

## 2020-08-21 DIAGNOSIS — G6289 Other specified polyneuropathies: Secondary | ICD-10-CM

## 2020-08-21 DIAGNOSIS — F1190 Opioid use, unspecified, uncomplicated: Secondary | ICD-10-CM

## 2020-08-21 LAB — POCT GLYCOSYLATED HEMOGLOBIN (HGB A1C): Hemoglobin A1C: 5 % (ref 4.0–5.6)

## 2020-08-21 LAB — GLUCOSE, CAPILLARY: Glucose-Capillary: 75 mg/dL (ref 70–99)

## 2020-08-21 MED ORDER — BUPRENORPHINE HCL-NALOXONE HCL 8-2 MG SL FILM: 1.0000 | ORAL_FILM | Freq: Two times a day (BID) | SUBLINGUAL | 0 refills | Status: DC

## 2020-08-21 NOTE — Telephone Encounter (Signed)
Patient came for OUD appt, was unaware clinic had been canceled today. She lives 45 minutes away. Appt r/s to next Tuesday, 6/14 at 0915. Requesting refill on suboxone to get to that appt. Now that patient has insurance she would like strips vs pills as pills cause N/V. Also, would like to have blood work done today. Future orders were placed at [facility] on 5/24.

## 2020-08-21 NOTE — Telephone Encounter (Signed)
Sent 1 week refill of her suboxone, changed to films per requests now that she has insurance. PDMP and prior notes reviewed. She has rescheduled her appointment for next week.

## 2020-08-28 ENCOUNTER — Ambulatory Visit (INDEPENDENT_AMBULATORY_CARE_PROVIDER_SITE_OTHER): Payer: 59 | Admitting: Student in an Organized Health Care Education/Training Program

## 2020-08-28 ENCOUNTER — Other Ambulatory Visit: Payer: Self-pay

## 2020-08-28 DIAGNOSIS — G6289 Other specified polyneuropathies: Secondary | ICD-10-CM | POA: Diagnosis not present

## 2020-08-28 DIAGNOSIS — F1190 Opioid use, unspecified, uncomplicated: Secondary | ICD-10-CM

## 2020-08-28 LAB — VITAMIN D 1,25 DIHYDROXY
Vitamin D 1, 25 (OH)2 Total: 58 pg/mL
Vitamin D2 1, 25 (OH)2: <10 pg/mL
Vitamin D3 1, 25 (OH)2: 58 pg/mL

## 2020-08-28 LAB — VITAMIN B12: Vitamin B-12: 293 pg/mL (ref 232–1245)

## 2020-08-28 LAB — TSH: TSH: 0.606 u[IU]/mL (ref 0.450–4.500)

## 2020-08-28 MED ORDER — BUPRENORPHINE HCL-NALOXONE HCL 8-2 MG SL FILM: 1.0000 | ORAL_FILM | Freq: Two times a day (BID) | SUBLINGUAL | 0 refills | Status: DC

## 2020-08-28 MED ORDER — GABAPENTIN 300 MG PO CAPS: 300.0000 mg | ORAL_CAPSULE | Freq: Three times a day (TID) | ORAL | 2 refills | Status: DC

## 2020-08-28 NOTE — Progress Notes (Signed)
  [facility] Telephone Encounter Continuity Care Appointment  HPI:  This telephone encounter was created for [name] on 08/28/2020 for the following purpose/cc OUD.  45 year old person here for follow up of OBOT. She is doing well. Was scheduled for in person, but changed to telephone this morning. She worked 3rd shift last night, did not feel safe to drive to clinic this morning due to drowsiness. She reports good adherence with suboxone. Denies any relapses. Says her cravings are well controlled. Ok access to medicine, her new insurance is not covering suboxone films well, she is going to call them to see if they cover a different formulation better. No recent illness. Still has neuropathic type pain, interested in restarting gabapentin which helped her in the past.      Past Medical History:  Past Medical History:  Diagnosis Date   Abnormal Pap smear    LSIL   Anxiety    Bipolar 1 disorder (HCC)    Depression    Hepatitis C    two years ago was dx   Ovarian cyst    Thyrotoxicosis      ROS:     Assessment / Plan / Recommendations:  Please see A&P under problem oriented charting for assessment of the patient's acute and chronic medical conditions.  As always, pt is advised that if symptoms worsen or new symptoms arise, they should go to an urgent care facility or to to ER for further evaluation.   Consent and Medical Decision Making:   This is a telephone encounter between [employer] and [name] on 08/28/2020 for OUD. The visit was conducted with the patient located at home and Barbara Simon at [facility]. The patient's identity was confirmed using their DOB and current address. The patient has consented to being evaluated through a telephone encounter and understands the associated risks (an examination cannot be done and the patient may need to come in for an appointment) / benefits (allows the patient to remain at home,  decreasing exposure to coronavirus). I personally spent 7 minutes on medical discussion.

## 2020-08-28 NOTE — Assessment & Plan Note (Signed)
Stable and seems to be well controlled. Has been on suboxone for about 2 months now with [facility]. Doing well. Good compliance, good access to the medicine. She denies relapse. I reviewed the database which was appropriate. Last toxassure appropriate. Will need to recheck at next visit. I have refilled a one month supply of suboxone 8mg  film, two per day.

## 2020-08-28 NOTE — Assessment & Plan Note (Signed)
Patient with neuropathic type pain diffusely, unknown etiology. We reviewed recent labs together. She has found benefit with gabapentin in the past. Will restart gabapentin at 300mg  tid.

## 2020-09-25 ENCOUNTER — Ambulatory Visit (INDEPENDENT_AMBULATORY_CARE_PROVIDER_SITE_OTHER): Payer: 59 | Admitting: Student

## 2020-09-25 ENCOUNTER — Encounter: Payer: Self-pay | Admitting: Student

## 2020-09-25 ENCOUNTER — Other Ambulatory Visit: Payer: Self-pay

## 2020-09-25 VITALS — BP 106/82 | HR 102 | Temp 98.3°F | Ht 63.0 in | Wt 173.4 lb

## 2020-09-25 DIAGNOSIS — F439 Reaction to severe stress, unspecified: Secondary | ICD-10-CM

## 2020-09-25 DIAGNOSIS — F419 Anxiety disorder, unspecified: Secondary | ICD-10-CM | POA: Diagnosis not present

## 2020-09-25 DIAGNOSIS — F1190 Opioid use, unspecified, uncomplicated: Secondary | ICD-10-CM

## 2020-09-25 MED ORDER — BUPRENORPHINE HCL-NALOXONE HCL 8-2 MG SL FILM: 1.0000 | ORAL_FILM | Freq: Two times a day (BID) | SUBLINGUAL | 0 refills | Status: DC

## 2020-09-25 NOTE — Assessment & Plan Note (Addendum)
Assessment: Currently stable and seems to be well controlled.  Patient denies cravings or relapsing.  She has been on Suboxone for approximately 3 months with [facility] and continues to do well.  She has good compliance.  There does seem to be difficulty affording the medications and so she self tapered down to 1 8 mg film a day until she was able to afford her refill.  This is why there is a discrepancy in her PDMP, prescription was written on 06/15 and patient was unable to afford until 07/01.  Prescription was sent to [facility] to see if this will be more affordable for the patient.  Repeat tox assure on today's visit, last was appropriate March.  We will refill 1 month supply of Suboxone 8 mg films 2/day.  Plan: -Continue Suboxone 8 mg films twice daily -f/u one month -Tox assure pending

## 2020-09-25 NOTE — Patient Instructions (Addendum)
Thank you, Ms.Clova Marcelle Overlie for allowing [name] to provide your care today. Today we discussed.  Buprenorphine use Please continue to take your buprenorphine.  If you have any difficulties getting the medication or need [name] to write the prescription for a different pharmacy please call our office.  If you have any worsening pain or increased anxiety please call our office.  I will be placing a referral for you to be seen by Dr. Sallyanne Kuster for counseling services.   I have ordered the following labs for you:   Lab Orders  ToxAssure Select,+Antidepr,UR     Referrals ordered today:   Referral Orders  No referral(s) requested today     I have ordered the following medication/changed the following medications:   Stop the following medications: There are no discontinued medications.   Start the following medications: No orders of the defined types were placed in this encounter.    Follow up: 1 month OUD clinic   Should you have any questions or concerns please call the internal medicine clinic at [phone].     Thalia Bloodgood, D.O. [facility]

## 2020-09-25 NOTE — Progress Notes (Signed)
   09/25/2020  Barbara Simon presents for follow up of opioid use disorder I have reviewed the prior induction visit, follow up visits, and telephone encounters relevant to opiate use disorder (OUD) treatment.   Current daily dose: Suboxone 8-2 mg once daily  Date of Induction: 07/24/2020  Current follow up interval, in weeks: 4  The patient has been adherent with the buprenorphine for OUD contract.   Last UDS Result: 07/24/2020, as expected.  HPI:   [name] presents to the ED clinic today for follow-up on her Suboxone use as well as discuss her general wellbeing.  She states that she has been struggling recently with high levels of anxiety secondary to the loss of her brother 11 months ago and dealing with many family stressors.  She feels as though she is able to talk with friends but that sometimes she feels as though they are judging her.  She states that she has tried multiple medications in the past for anxiety depression and that none of them have worked, she specifically mentions SNRI and SSRIs.  When discussing and bringing up the idea of counseling, patient is interested and would like to pursue this.  In terms of her Suboxone use patient states that she feels as though it is continuing to work.  She was unfortunately unable to afford a 2-week supply recently and states that she weaned herself to one 8 mg film a day.  During this time she did endorse feeling her pain a little more but denied any withdrawal symptoms, denied any episodes of nausea vomiting or tremors.  She denies cravings.  She does endorse an episode of marijuana use due to her high levels of anxiety.  In order to afford her medication more frequently she would like to switch her prescription to [facility] in [place].  She has no other acute concerns at this time.   Exam:  Constitutional: Well-appearing in no acute distress HENT: normocephalic atraumatic Eyes: conjunctiva non-erythematous Neck:  supple Pulmonary/Chest: normal work of breathing on room air MSK: normal bulk and tone Neurological: alert & oriented x 3 Skin: warm and dry Psych: Normal mood and thought process  Assessment/Plan:  See Problem Based Charting in the Encounters Tab   Belva Agee, MD  09/25/2020  8:12 AM

## 2020-09-25 NOTE — Assessment & Plan Note (Signed)
Assessment: Patient endorses increased stressors at home secondary to losing her brother 11 months ago as well as due to other family stressors.  She does not feel as though she has a good resource to discuss these matters as when discussing her stressors with family and friends at times she can feel as though they are by his.  This is led patient to using marijuana once.  I do expect this to show up on her tox assure.  Discussed counseling options and patient was very interested in this, we will refer her to our counseling services.  Hopefully she will be able to meet with Dr. Sallyanne Kuster in the next week or so.  She denies wanting to start medication therapy, discussed benefits of medication plus counseling has shown higher levels improvement however patient states she has tried these in the past and none of them worked and she would not like to try again.  Plan: -Referral placed for appointment with Dr. Sallyanne Kuster -follow up in one month

## 2020-09-26 ENCOUNTER — Telehealth: Payer: Self-pay | Admitting: Behavioral Health

## 2020-09-26 NOTE — Telephone Encounter (Signed)
Contacted Pt @ 11:30am to promote Intro & alert her to upcoming appt for psychotherapy w/Clinician. Pt acknowledged & is appreciative of call. Pt is aware Letter w/appt date & time will be sent.  Dr. Monna Fam

## 2020-09-27 ENCOUNTER — Other Ambulatory Visit: Payer: Self-pay

## 2020-09-27 MED ORDER — GABAPENTIN 300 MG PO CAPS: 300.0000 mg | ORAL_CAPSULE | Freq: Three times a day (TID) | ORAL | 2 refills | Status: DC

## 2020-09-27 NOTE — Telephone Encounter (Signed)
gabapentin (NEURONTIN) 300 MG capsule, REFILL REQUEST @ [facility] [phone] - [place], [place] - [address] AT [facility] [address]

## 2020-09-28 NOTE — Progress Notes (Signed)
Internal Medicine Clinic Attending  Case discussed with Dr. Katsadouros  At the time of the visit.  We reviewed the resident's history and exam and pertinent patient test results.  I agree with the assessment, diagnosis, and plan of care documented in the resident's note.  

## 2020-10-01 LAB — TOXASSURE SELECT,+ANTIDEPR,UR

## 2020-10-15 ENCOUNTER — Ambulatory Visit: Payer: 59 | Admitting: Behavioral Health

## 2020-10-15 DIAGNOSIS — F439 Reaction to severe stress, unspecified: Secondary | ICD-10-CM

## 2020-10-15 DIAGNOSIS — F419 Anxiety disorder, unspecified: Secondary | ICD-10-CM

## 2020-10-15 DIAGNOSIS — F3170 Bipolar disorder, currently in remission, most recent episode unspecified: Secondary | ICD-10-CM

## 2020-10-15 NOTE — BH Specialist Note (Signed)
Integrated Behavioral Health via Telemedicine Visit  10/15/2020 Barbara Simon [id number]  Number of Integrated Behavioral Health visits: 1/6 Session Start time: 9:00am  Session End time: 9:45am Total time: 45   Referring Provider: Dr. Charlotta Newton, DO Patient/Family location: Pt is home in private [facility] Provider location: [facility] Office All persons participating in visit: Pt & Clinician Types of Service: Individual psychotherapy  I connected with Barbara [place] and Simon and/or Barbara Home	[name]  self  via  Telephone or Engineer, civil (consulting)  (Video is Surveyor, mining) and verified that I am speaking with the correct person using two identifiers. Discussed confidentiality: Yes   I discussed the limitations of telemedicine and the availability of in person appointments.  Discussed there is a possibility of technology failure and discussed alternative modes of communication if that failure occurs.  I discussed that engaging in this telemedicine visit, they consent to the provision of behavioral healthcare and the services will be billed under their insurance.  Patient and/or legal guardian expressed understanding and consented to Telemedicine visit: Yes   Presenting Concerns: Patient and/or family reports the following symptoms/concerns: elevated concerns for Niece & Nephew whose Father died 1 1/2 yrs ago Duration of problem: 1 1/2 yrs; Severity of problem: moderate  Patient and/or Family's Strengths/Protective Factors: Social connections, Social and Emotional competence, Concrete supports in place (healthy food, safe environments, etc.), Sense of purpose, and Caregiver has knowledge of parenting & child development  Goals Addressed: Patient will:  Reduce symptoms of: anxiety, depression, and stress   Increase knowledge and/or ability of: coping skills, stress reduction, and grief reaction    Demonstrate ability to: Increase healthy adjustment to current life  circumstances, Increase adequate support systems for patient/family, and Begin healthy grieving over loss  Progress towards Goals: Estb'd today; Pt will cont psychotherapy for 2 mos to assist in grieving & other close Family members  Interventions: Interventions utilized:  Solution-Focused Strategies, Behavioral Activation, Supportive Counseling, and Psychoeducation and/or Health Education Standardized Assessments completed:  screeners prn  Patient and/or Family Response: Pt receptive to call today & has concern for her Barbara Simon who is not adjusting well to the death.  Assessment: Patient currently experiencing elevated anxiety due to welfare of Barbara Sonda Primes' children.   Patient may benefit from cont'd support for Family dynamics which include the best interests of the children.  Plan: Follow up with behavioral health clinician on : 2-3 wks on telehealth for 60 min Behavioral recommendations: Take notes btwn sessions to keep visits focused Referral(s): Integrated Behavioral Health Services (In Clinic) and probable future Referral to [facility]  I discussed the assessment and treatment plan with the patient and/or parent/guardian. They were provided an opportunity to ask questions and all were answered. They agreed with the plan and demonstrated an understanding of the instructions.   They were advised to call back or seek an in-person evaluation if the symptoms worsen or if the condition fails to improve as anticipated.  Barbara Lever, LMFT

## 2020-11-06 ENCOUNTER — Telehealth: Payer: Self-pay | Admitting: Behavioral Health

## 2020-11-06 ENCOUNTER — Ambulatory Visit: Payer: 59 | Admitting: Behavioral Health

## 2020-11-06 NOTE — Telephone Encounter (Signed)
Contacted Pt for session via telehealth today @ 1:00pm. Pt is @ Urgent Care tending to a rash that is re-occurring. Spoke for approximately 10 min prior to Pt being called into the Exam Room.   Pt has made some positive decisions re: her future. She requests a future RC.  Dr. Monna Fam

## 2020-11-20 ENCOUNTER — Ambulatory Visit (INDEPENDENT_AMBULATORY_CARE_PROVIDER_SITE_OTHER): Payer: 59 | Admitting: Student

## 2020-11-20 DIAGNOSIS — J3089 Other allergic rhinitis: Secondary | ICD-10-CM

## 2020-11-20 DIAGNOSIS — Z720 Tobacco use: Secondary | ICD-10-CM | POA: Diagnosis not present

## 2020-11-20 DIAGNOSIS — J339 Nasal polyp, unspecified: Secondary | ICD-10-CM | POA: Insufficient documentation

## 2020-11-20 DIAGNOSIS — J309 Allergic rhinitis, unspecified: Secondary | ICD-10-CM | POA: Insufficient documentation

## 2020-11-20 DIAGNOSIS — F1190 Opioid use, unspecified, uncomplicated: Secondary | ICD-10-CM | POA: Diagnosis not present

## 2020-11-20 MED ORDER — GABAPENTIN 300 MG PO CAPS: 300.0000 mg | ORAL_CAPSULE | Freq: Three times a day (TID) | ORAL | 2 refills | Status: DC

## 2020-11-20 MED ORDER — BUPRENORPHINE HCL-NALOXONE HCL 8-2 MG SL FILM: 1.0000 | ORAL_FILM | Freq: Two times a day (BID) | SUBLINGUAL | 0 refills | Status: DC

## 2020-11-20 MED ORDER — CETIRIZINE HCL 5 MG/5ML PO SOLN: 10.0000 mg | Freq: Every day | ORAL | 0 refills | Status: DC

## 2020-11-20 NOTE — Assessment & Plan Note (Signed)
Patient is currently on Suboxone 8 mg twice daily.  At last visit in July patient noted difficulty affording her medications her [facility].  States prescription was about the same price as her prior pharmacy but has been able to afford her medication.  She last picked up her prescription on August 6.  Feels her pain is well controlled.  Denies any symptoms of withdrawal including nausea, abdominal pain, vomiting, and diarrhea.  She denies any cravings or relapses.  Patient appears to be doing well on her current dose of Suboxone.  She remains in good compliance of her medications.  UDS at last visit on 712 was appropriate.  PDMP reviewed at this visit and also appropriate.  We will refill her Suboxone for 1 month supply.  She is requesting telehealth visit for her 4-week follow-up.  Plan -Continue Suboxone 8 mg films twice daily -Follow-up in 1 month for telehealth visit

## 2020-11-20 NOTE — Patient Instructions (Addendum)
It was a pleasure seeing you in clinic. Today we discussed:   Suboxone: We have refilled your suboxone today, please continue with 8 mg twice daily  Congestion: Please try cetrizine 10 mg daily as needed for allergies  to see if this helps. Please make a follow up appointment in clinic if symptoms persist or worsen.  If you have any questions or concerns, please call our clinic at [phone] between 9am-5pm and after hours call [phone] and ask for the internal medicine resident on call. If you feel you are having a medical emergency please call 911.   Thank you, we look forward to helping you remain healthy!  Follow up in 4 weeks for telehealth visit

## 2020-11-20 NOTE — Assessment & Plan Note (Signed)
Patient reports she has a 30-year history of smoking.  She currently smokes 1 pack a day.  Stop smoking while she was incarcerated but restarted in April when she left prison.  States she is thinking about quitting.  Is currently in a in contemplation of quitting.  Has never used any cessation aids in the past.  Discussed medications that could help with cravings in addition to nicotine replacement therapy.  She would like to continue thinking about this and discuss at future visit.

## 2020-11-20 NOTE — Progress Notes (Signed)
   11/20/2020  Barbara Simon presents for follow up of opioid use disorder I have reviewed the prior induction visit, follow up visits, and telephone encounters relevant to opiate use disorder (OUD) treatment.   Current daily dose: Suboxone 8--2 twice daily   Date of Induction: 07/24/2020  Current follow up interval, in weeks: 4 weeks   The patient has been adherent with the buprenorphine for OUD contract.   Last UDS Result: 09/25/2020, as expected  HPI:  [name] presents to the [facility] clinic today for follow-up of her Suboxone. She states she has been doing well on her current dose of Suboxone 8 mg twice daily.  Was able to pick up her prescription on August 6 at the new pharmacy but the pricing was about the same.  She also endorses congestion runny nose since yesterday night after doing yard work. Please refer to problem based charting for further details and assessment and plan of current problem and chronic medical conditions.  Exam:   Vitals:   11/20/20 0915  BP: 103/63  Pulse: 81  Temp: 97.8 F (36.6 C)  TempSrc: Oral  SpO2: 97%  Weight: 183 lb (83 kg)   Constitutional: Appears well-developed and well-nourished. No distress.  HENT: Normocephalic and atraumatic, Eyes: Normal conjunctiva, no icterus Cardiovascular: Normal rate, regular rhythm, S1 and S2 present, no murmurs, rubs, gallops.  Distal pulses intact Respiratory: No respiratory distress. Effort is normal.  Lungs are clear to auscultation bilaterally.  Intermittent nonproductive cough GI: Nondistended, soft, nontender to palpation, normal active bowel sounds Musculoskeletal: Normal bulk and tone.  Neurological: Is alert and oriented x4 Skin: Warm and dry.  No rash, erythema, lesions noted. Psychiatric: Normal mood and affect.  Assessment/Plan:  See Problem Based Charting in the Encounters Tab     Quincy Simmonds, MD  11/20/2020  9:51 AM

## 2020-11-20 NOTE — Assessment & Plan Note (Addendum)
Patient reports chest congestion, runny nose, and intermittent nonproductive cough.  Started yesterday evening after doing yard work.  No she has had the symptoms in the past and has taken Claritin and Zyrtec for this with improvement.  She denies fever, shortness of breath, and chest pain.  Lungs are clear to auscultation on exam.  She is requesting prescription medication for her allergies as her insurance tends to cover generic prescriptions.  Notes to and present she was prescribed albuterol inhaler in addition to allergy medications for bronchitis and similar symptoms that she is endorsing today. Has been using albuterol at home since leaving prison about once a week which helps with her symptoms, but ran out about a month ago. No prior lung testing in our records.  May benefit from pulmonary function test to further evaluate this at future visit with PCP.  Plan Cetirizine 10 mg daily as needed for allergies May benefit from PFTs and further evaluation of dyspnea in the future

## 2020-11-21 NOTE — Progress Notes (Signed)
Internal Medicine Clinic Attending ? ?Case discussed with Dr. Liang  At the time of the visit.  We reviewed the resident?s history and exam and pertinent patient test results.  I agree with the assessment, diagnosis, and plan of care documented in the resident?s note. ? ?

## 2020-11-28 ENCOUNTER — Ambulatory Visit
Admission: EM | Admit: 2020-11-28 | Discharge: 2020-11-28 | Disposition: A | Discharge status: Home / Self Care | Payer: 59 | Attending: Family Medicine | Admitting: Family Medicine

## 2020-11-28 ENCOUNTER — Ambulatory Visit: Payer: Self-pay | Admitting: Internal Medicine

## 2020-11-28 ENCOUNTER — Encounter: Payer: Self-pay | Admitting: Emergency Medicine

## 2020-11-28 DIAGNOSIS — R059 Cough, unspecified: Secondary | ICD-10-CM

## 2020-11-28 DIAGNOSIS — J0100 Acute maxillary sinusitis, unspecified: Secondary | ICD-10-CM | POA: Diagnosis not present

## 2020-11-28 MED ORDER — ONDANSETRON 4 MG PO TBDP: 4.0000 mg | ORAL_TABLET | Freq: Three times a day (TID) | ORAL | 0 refills | Status: DC | PRN

## 2020-11-28 MED ORDER — PREDNISONE 20 MG PO TABS: 40.0000 mg | ORAL_TABLET | Freq: Every day | ORAL | 0 refills | Status: DC

## 2020-11-28 MED ORDER — BENZONATATE 100 MG PO CAPS: ORAL_CAPSULE | ORAL | 0 refills | Status: DC

## 2020-11-28 MED ORDER — DOXYCYCLINE HYCLATE 100 MG PO CAPS: 100.0000 mg | ORAL_CAPSULE | Freq: Two times a day (BID) | ORAL | 0 refills | Status: DC

## 2020-11-28 NOTE — Telephone Encounter (Signed)
Reason for Disposition  [1] SEVERE pain AND [2] not improved 2 hours after pain medicine  Answer Assessment - Initial Assessment Questions 1. LOCATION: "Where does it hurt?"      Between eyes  2. ONSET: "When did the sinus pain start?"  (e.g., hours, days)      Last week . Was seen in UC in [place]  3. SEVERITY: "How bad is the pain?"   (Scale 1-10; mild, moderate or severe)   - MILD (1-3): doesn't interfere with normal activities    - MODERATE (4-7): interferes with normal activities (e.g., work or school) or awakens from sleep   - SEVERE (8-10): excruciating pain and patient unable to do any normal activities        Can not go to work due to symptoms of dizziness, head pain , wheezing  4. RECURRENT SYMPTOM: "Have you ever had sinus problems before?" If Yes, ask: "When was the last time?" and "What happened that time?"      na 5. NASAL CONGESTION: "Is the nose blocked?" If Yes, ask: "Can you open it or must you breathe through your mouth?"     na 6. NASAL DISCHARGE: "Do you have discharge from your nose?" If so ask, "What color?"     na 7. FEVER: "Do you have a fever?" If Yes, ask: "What is it, how was it measured, and when did it start?"      na 8. OTHER SYMPTOMS: "Do you have any other symptoms?" (e.g., sore throat, cough, earache, difficulty breathing)     Wheezing, dizziness, headache, short of breath taking deep breath 9. PREGNANCY: "Is there any chance you are pregnant?" "When was your last menstrual period?"     na  Protocols used: Sinus Pain or Congestion-A-AH

## 2020-11-28 NOTE — Telephone Encounter (Signed)
Patient called on community line requesting advise for continued c/o sinus infection symptoms.  Was seen at [facility] in [place] this past weekend. Prescribed antibiotic and no name of medication given. Patient reports she is unable to tolerate taking antibiotic due to side effects of vomiting and diarrhea after taking antibiotic. Worsening symptoms reported of chest congestion , wheezing, "hard to breath when taking a deep breath". C/o dizziness and headache. Reports staying hydrated. Patient seeking advise if she needs to come in. Encouraged patient to call PCP and patient reports she does not have a PCP. Instructed patient to go to UC or ED due to worsening symptoms of shortness of breath when taking deep breath and dizziness and wheezing. Care advise given. Patient verbalized understanding of care advise and to call back or go to ED if symptoms worsen or call 911.

## 2020-11-28 NOTE — ED Provider Notes (Signed)
[facility]   [id number] 11/28/20 Arrival Time: 0865  ASSESSMENT & PLAN:  1. Acute non-recurrent maxillary sinusitis   2. Cough    GI symptoms from amox have resolved; slight nausea.  Begin: Meds ordered this encounter  Medications   predniSONE (DELTASONE) 20 MG tablet    Sig: Take 2 tablets (40 mg total) by mouth daily.    Dispense:  10 tablet    Refill:  0   benzonatate (TESSALON) 100 MG capsule    Sig: Take 1 capsule by mouth every 8 (eight) hours for cough.    Dispense:  21 capsule    Refill:  0   doxycycline (VIBRAMYCIN) 100 MG capsule    Sig: Take 1 capsule (100 mg total) by mouth 2 (two) times daily.    Dispense:  20 capsule    Refill:  0   ondansetron (ZOFRAN-ODT) 4 MG disintegrating tablet    Sig: Take 1 tablet (4 mg total) by mouth every 8 (eight) hours as needed for nausea or vomiting.    Dispense:  15 tablet    Refill:  0   Recommend:  Follow-up Information     Verdene Lennert, MD.   Specialty: Internal Medicine Why: If worsening or failing to improve as anticipated. Contact information: [address] [place] [address] [phone]                  Reviewed expectations re: course of current medical issues. Questions answered. Outlined signs and symptoms indicating need for more acute intervention. Understanding verbalized. After Visit Summary given.   SUBJECTIVE: History from: patient. Barbara Simon is a 45 y.o. female who reports being tx for sinus infection beginning 2 d ago after 1 week of nasal congestion/sinus pressure. Rx amox; caused n/v/d. Stopped. Here for alternative. With a cough also. Denies: fever and difficulty breathing. Normal PO intake without n/v/d.  Social History   Tobacco Use  Smoking Status Every Day   Packs/day: 1.00   Types: Cigarettes  Smokeless Tobacco Never  Tobacco Comments   1 PPD     OBJECTIVE:  Vitals:   11/28/20 1827  BP: 122/74  Pulse: (!) 122  Resp: 18  Temp:  97.9 F (36.6 C)  TempSrc: Temporal  SpO2: 96%    Tachycardia noted (reports she just used her albuterol inhaler causing increased HR) General appearance: alert; no distress Eyes: PERRLA; EOMI; conjunctiva normal HENT: [place]; AT; with nasal congestion; bilateral maxillary sinus TTP Neck: supple  Lungs: speaks full sentences without difficulty; unlabored; CTAB; dry cough Extremities: no edema Skin: warm and dry Neurologic: normal gait Psychological: alert and cooperative; normal mood and affect   Allergies  Allergen Reactions   Toradol [Ketorolac Tromethamine] Hives and Itching   Adhesive [Tape] Itching and Rash    Past Medical History:  Diagnosis Date   Abnormal Pap smear    LSIL   Anxiety    Bipolar 1 disorder (HCC)    Depression    Hepatitis C    two years ago was dx   Ovarian cyst    Thyrotoxicosis    Social History   Socioeconomic History   Marital status: Married    Spouse name: Not on file   Number of children: Not on file   Years of education: Not on file   Highest education level: Not on file  Occupational History   Not on file  Tobacco Use   Smoking status: Every Day    Packs/day: 1.00  Types: Cigarettes   Smokeless tobacco: Never   Tobacco comments:    1 PPD  Substance and Sexual Activity   Alcohol use: No   Drug use: No   Sexual activity: Never    Birth control/protection: None  Other Topics Concern   Not on file  Social History Narrative   Not on file   Social Determinants of Health   Financial Resource Strain: Not on file  Food Insecurity: Not on file  Transportation Needs: Not on file  Physical Activity: Not on file  Stress: Not on file  Social Connections: Not on file  Intimate Partner Violence: Not on file   Family History  Problem Relation Age of Onset   Cancer Maternal Grandmother    Cancer Maternal Aunt    Past Surgical History:  Procedure Laterality Date   ABDOMINAL SURGERY     APPENDECTOMY     TUBAL LIGATION        Mardella Layman, MD 11/28/20 [phone]

## 2020-11-28 NOTE — ED Triage Notes (Signed)
Sinus congestion x 1 week.  Was placed on an antibiotic for sinus infection.  States she has gotten worse and vomiting with diarrhea due to antibiotic.  States she feels like she can't catch her breath.

## 2020-12-03 ENCOUNTER — Ambulatory Visit: Payer: 59 | Admitting: Behavioral Health

## 2020-12-17 ENCOUNTER — Telehealth: Payer: Self-pay

## 2020-12-17 NOTE — Telephone Encounter (Signed)
Requesting to speak with a nurse about not feeling well and getting a note for work. Please call pt back.

## 2020-12-17 NOTE — Telephone Encounter (Signed)
Return pt's call.stated she has not been feeling well. C/o sinus infection and bronchitis x 1 month - went to ED on 9/14; meds prescribed. Felt better at that time. Just got back from the beach - now c/o coughing and congestion; unable to go to work today. Informed she needs to be evaluated in- person by a doctor. She agreed - asked if she could be seen after her OUD appt tomorrow. Informed no OUD clinic tomorrow (stated she was given an appt after her last visit). She will see Dr Austin Miles tomorrow 12/18/20 @ 1515PM - first available - for OUD and not feeling well.

## 2020-12-18 ENCOUNTER — Other Ambulatory Visit: Payer: Self-pay

## 2020-12-18 ENCOUNTER — Ambulatory Visit (INDEPENDENT_AMBULATORY_CARE_PROVIDER_SITE_OTHER): Payer: 59 | Admitting: Student

## 2020-12-18 ENCOUNTER — Encounter: Payer: Self-pay | Admitting: Student

## 2020-12-18 DIAGNOSIS — F1190 Opioid use, unspecified, uncomplicated: Secondary | ICD-10-CM

## 2020-12-18 NOTE — Assessment & Plan Note (Signed)
Patient currently Suboxone 8mg  BID. She has been doing well on her current prescription, denies any cravings or relapses over the past month. Last picked up prescription on September 6th. Will refill for another month and have her follow up. She will need a Utox at next visit.   Plan: -continue suboxone 8mg  films BID -f/u in 1 month for Utox and subsequent refill

## 2020-12-18 NOTE — Progress Notes (Signed)
   CC: f/u of OUD  HPI:  Barbara Simon is a 45 y.o. female with history listed below presenting to the [facility] for f/u of chronic medical issues. Please see individualized problem based charting for full HPI.  Past Medical History:  Diagnosis Date   Abnormal Pap smear    LSIL   Anxiety    Bipolar 1 disorder (HCC)    Depression    Hepatitis C    two years ago was dx   Ovarian cyst    Thyrotoxicosis     Review of Systems:  Negative aside from that listed in individualized problem based charting.  Physical Exam:  Vitals:   12/18/20 1510  BP: 110/76  Pulse: 79  Resp: (!) 32  Temp: 98.1 F (36.7 C)  TempSrc: Oral  SpO2: 100%  Weight: 182 lb 14.4 oz (83 kg)  Height: 5\' 3"  (1.6 m)   Physical Exam Constitutional:      Appearance: She is obese. She is not ill-appearing.  HENT:     Nose: Nose normal. No congestion.     Mouth/Throat:     Mouth: Mucous membranes are moist.     Pharynx: Oropharynx is clear. No oropharyngeal exudate.  Eyes:     Extraocular Movements: Extraocular movements intact.     Conjunctiva/sclera: Conjunctivae normal.     Pupils: Pupils are equal, round, and reactive to light.  Cardiovascular:     Rate and Rhythm: Normal rate and regular rhythm.     Pulses: Normal pulses.     Heart sounds: Normal heart sounds. No murmur heard.   No friction rub. No gallop.  Pulmonary:     Effort: Pulmonary effort is normal.     Breath sounds: Normal breath sounds. No wheezing, rhonchi or rales.  Abdominal:     General: Bowel sounds are normal. There is no distension.     Palpations: Abdomen is soft.     Tenderness: There is no abdominal tenderness.  Skin:    General: Skin is warm and dry.  Neurological:     General: No focal deficit present.     Mental Status: She is alert and oriented to person, place, and time.  Psychiatric:        Mood and Affect: Mood normal.        Behavior: Behavior normal.     Assessment & Plan:   See Encounters Tab for problem  based charting.  Patient discussed with Dr. 

## 2020-12-18 NOTE — Patient Instructions (Signed)
Ms Barbara Simon,  It was a pleasure seeing you in the clinic today.   We will refill your suboxone prescription tomorrow. I have attached your work note. Please come back in 1 month.  Please call our clinic at [phone] if you have any questions or concerns. The best time to call is Monday-Friday from 9am-4pm, but there is someone available 24/7 at the same number. If you need medication refills, please notify your pharmacy one week in advance and they will send [name] a request.   Thank you for letting us take part in your care. We look forward to seeing you next time!

## 2020-12-19 ENCOUNTER — Other Ambulatory Visit: Payer: Self-pay | Admitting: Internal Medicine

## 2020-12-19 DIAGNOSIS — F1190 Opioid use, unspecified, uncomplicated: Secondary | ICD-10-CM

## 2020-12-19 MED ORDER — BUPRENORPHINE HCL-NALOXONE HCL 8-2 MG SL FILM: 1.0000 | ORAL_FILM | Freq: Two times a day (BID) | SUBLINGUAL | 0 refills | Status: DC

## 2020-12-19 NOTE — Addendum Note (Signed)
Addended by: Merrilyn Puma on: 12/19/2020 03:09 PM   Modules accepted: Level of Service

## 2020-12-19 NOTE — Progress Notes (Signed)
Internal Medicine Clinic Attending  Case discussed with Dr. Jinwala  At the time of the visit.  We reviewed the resident's history and exam and pertinent patient test results.  I agree with the assessment, diagnosis, and plan of care documented in the resident's note.  

## 2020-12-20 ENCOUNTER — Ambulatory Visit: Payer: 59 | Admitting: Behavioral Health

## 2020-12-20 DIAGNOSIS — F3177 Bipolar disorder, in partial remission, most recent episode mixed: Secondary | ICD-10-CM

## 2020-12-20 NOTE — BH Specialist Note (Signed)
Integrated Behavioral Health via Telemedicine Visit  12/20/2020 Derin Matthes [id number]  Number of Integrated Behavioral Health visits: 2/6 Session Start time: 10:00am  Session End time: 10:30am Total time: 20  Referring Provider: Dr. Charlotta Newton, DO Patient/Family location: Pt is @ one of her jobs in private [facility] Provider location: [place] [facility]  All persons participating in visit: Pt & Clinician Types of Service: Individual psychotherapy  I connected with Lamyia [name] and/or [name]	[name]  self  via  Telephone or Engineer, civil (consulting)  (Video is Surveyor, mining) and verified that I am speaking with the correct person using two identifiers. Discussed confidentiality:  2nd visit  I discussed the limitations of telemedicine and the availability of in person appointments.  Discussed there is a possibility of technology failure and discussed alternative modes of communication if that failure occurs.  I discussed that engaging in this telemedicine visit, they consent to the provision of behavioral healthcare and the services will be billed under their insurance.  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  Pt is active in Recovery for 5 yrs now-4 1/2 of these spent in jail. Pt presents today w/concerns for her "bronchitis" not resolving over the course of the past mos.   Duration of problem: one month; Severity of problem: moderate  Patient and/or Family's Strengths/Protective Factors: Social and Emotional competence, Concrete supports in place (healthy food, safe environments, etc.), and Sense of purpose  Goals Addressed: Patient  will:  Reduce symptoms of: anxiety and depression   Increase knowledge and/or ability of: coping skills and healthy habits   Demonstrate ability to: Increase healthy adjustment to current life circumstances  Progress towards Goals: Ongoing  Interventions: Interventions utilized:  Supportive Counseling, Supportive Reflection, and Recovery Support/Relapse Prevention. Standardized Assessments completed: Not Needed  Patient and/or Family Response: Pt receptive to call & requests future ck-in visits  Assessment: Pt is currently sick w/a chest cold she has been dealing w/for one month now.  Patient may benefit from cont'd ck-in sessions for support & encouragement.  Plan: Follow up with behavioral health clinician on : 2-3 wks on telehealth for 30 min Behavioral recommendations: None @ this time Referral(s): Integrated [employer] (In Clinic)  I discussed the assessment and treatment plan with the patient and/or parent/guardian. They were provided an opportunity to ask questions and all were answered. They agreed with the plan and demonstrated an understanding of the instructions.   They were advised to call back or seek an in-person evaluation if the symptoms worsen or if the condition fails to improve as anticipated.  [name], LMFT

## 2020-12-24 ENCOUNTER — Encounter: Payer: Self-pay | Admitting: Internal Medicine

## 2020-12-24 ENCOUNTER — Ambulatory Visit (HOSPITAL_COMMUNITY)
Admission: RE | Admit: 2020-12-24 | Discharge: 2020-12-24 | Disposition: A | Discharge status: Home / Self Care | Payer: 59 | Source: Ambulatory Visit | Attending: Internal Medicine | Admitting: Internal Medicine

## 2020-12-24 ENCOUNTER — Other Ambulatory Visit: Payer: Self-pay

## 2020-12-24 ENCOUNTER — Ambulatory Visit (INDEPENDENT_AMBULATORY_CARE_PROVIDER_SITE_OTHER): Payer: 59 | Admitting: Internal Medicine

## 2020-12-24 VITALS — BP 119/80 | HR 83 | Temp 98.2°F | Resp 28 | Ht 63.0 in | Wt 180.7 lb

## 2020-12-24 DIAGNOSIS — R052 Subacute cough: Secondary | ICD-10-CM | POA: Diagnosis not present

## 2020-12-24 DIAGNOSIS — R051 Acute cough: Secondary | ICD-10-CM | POA: Diagnosis not present

## 2020-12-24 DIAGNOSIS — J45909 Unspecified asthma, uncomplicated: Secondary | ICD-10-CM | POA: Insufficient documentation

## 2020-12-24 DIAGNOSIS — R059 Cough, unspecified: Secondary | ICD-10-CM | POA: Insufficient documentation

## 2020-12-24 MED ORDER — GUAIFENESIN 100 MG/5ML PO SOLN: 5.0000 mL | ORAL | 0 refills | Status: DC | PRN

## 2020-12-24 MED ORDER — ALBUTEROL SULFATE HFA 108 (90 BASE) MCG/ACT IN AERS: 2.0000 | INHALATION_SPRAY | Freq: Four times a day (QID) | RESPIRATORY_TRACT | 2 refills | Status: DC | PRN

## 2020-12-24 MED ORDER — PREDNISONE 20 MG PO TABS: 40.0000 mg | ORAL_TABLET | Freq: Every day | ORAL | 0 refills | Status: AC

## 2020-12-24 NOTE — Assessment & Plan Note (Addendum)
Patient reports ongoing nonproductive cough for approximately 1 month duration.  She reports being evaluated for this multiple times at urgent cares and intermittently prescribed prednisone, doxycycline, Z-Pak.  However, has not had any significant relief.  Also reports using her albuterol inhaler multiple times throughout the day and has used her neighbors duo nebs.  She denies any fevers or chills.  She does note intermittent green mucus from her nose but denies any productive cough at this time.  She does note shortness of breath with exertion. On exam, patient is saturating 100% on room air.  Patient has diffuse expiratory wheezing in all lung fields.  Patient does have a an extensive history of tobacco use and suspect that her current symptoms may be secondary to COPD exacerbation versus bronchitis versus viral pneumonia.   Plan Chest x-ray Prednisone 40 mg daily for 5 days Albuterol every 6 hours as needed Guaifenesin 5 mL q4h as needed Prior history of tobacco use, would benefit from formal PFTs once recovered.

## 2020-12-24 NOTE — Progress Notes (Signed)
   CC: cough  HPI:  [name] is a 45 y.o. female with past medical history stated below presenting for evaluation of persistent cough for 1 month.  She reports briefly being evaluated at this for urgent care and being treated with prednisone followed by Z-Pak.  However, no significant relief.  Please see problem based charting for complete assessment plan.  Past Medical History:  Diagnosis Date   Abnormal Pap smear    LSIL   Anxiety    Bipolar 1 disorder (HCC)    Depression    Hepatitis C    two years ago was dx   Ovarian cyst    Thyrotoxicosis    Review of Systems:  Negative except as stated in HPI.  Physical Exam:  Vitals:   12/24/20 1549  BP: 119/80  Pulse: 83  Resp: (!) 28  Temp: 98.2 F (36.8 C)  TempSrc: Oral  SpO2: 100%  Weight: 180 lb 11.2 oz (82 kg)  Height: 5\' 3"  (1.6 m)   Physical Exam  Constitutional: Middle-aged female, well-developed and well-nourished.  Mild distress HENT: Normocephalic and atraumatic Cardiovascular: Normal rate, regular rhythm, S1 and S2 present, no murmurs, rubs, gallops.  Distal pulses intact Respiratory: No respiratory distress, lungs with diffuse expiratory wheezing; no significant rales or rhonchi Musculoskeletal: Normal bulk and tone.   Skin: Warm and dry.  No rash, erythema, lesions noted. Psychiatric: Normal mood and affect. Behavior is normal. Judgment and thought content normal.    Assessment & Plan:   See Encounters Tab for problem based charting.  Patient discussed with Dr. 

## 2020-12-24 NOTE — Patient Instructions (Addendum)
Ms Barbara Simon,  It was a pleasure seeing you in clinic. Today we discussed:   Cough:  Chest x-ray ordered.  Prednisone 40mg  daily for five days Albuterol 2 puffs every 6 hours as needed.  Please follow up in 4-6 weeks once cough has resolved for formal pulmonary testing.   If you have any questions or concerns, please call our clinic at [phone] between 9am-5pm and after hours call [phone] and ask for the internal medicine resident on call. If you feel you are having a medical emergency please call 911.   Thank you, we look forward to helping you remain healthy!

## 2020-12-26 NOTE — Progress Notes (Signed)
Internal Medicine Clinic Attending ° °Case discussed with Dr. Aslam  At the time of the visit.  We reviewed the resident’s history and exam and pertinent patient test results.  I agree with the assessment, diagnosis, and plan of care documented in the resident’s note.  °

## 2021-01-08 ENCOUNTER — Ambulatory Visit (INDEPENDENT_AMBULATORY_CARE_PROVIDER_SITE_OTHER): Payer: 59 | Admitting: Internal Medicine

## 2021-01-08 ENCOUNTER — Encounter: Payer: Self-pay | Admitting: Internal Medicine

## 2021-01-08 VITALS — BP 96/67 | HR 95 | Temp 98.2°F | Ht 63.0 in | Wt 179.9 lb

## 2021-01-08 DIAGNOSIS — Z860100 Personal history of colon polyps, unspecified: Secondary | ICD-10-CM | POA: Insufficient documentation

## 2021-01-08 DIAGNOSIS — R052 Subacute cough: Secondary | ICD-10-CM | POA: Diagnosis not present

## 2021-01-08 DIAGNOSIS — Z720 Tobacco use: Secondary | ICD-10-CM | POA: Diagnosis not present

## 2021-01-08 DIAGNOSIS — M62838 Other muscle spasm: Secondary | ICD-10-CM

## 2021-01-08 DIAGNOSIS — R053 Chronic cough: Secondary | ICD-10-CM | POA: Diagnosis not present

## 2021-01-08 DIAGNOSIS — R1013 Epigastric pain: Secondary | ICD-10-CM

## 2021-01-08 NOTE — Patient Instructions (Signed)
Thank you, [name] for allowing [name] to provide your care today. Today we discussed:  Abdominal discomfort with nausea and vomiting: Please increase your Prilosec OTC to 40 mg twice daily.  Please make sure you are drinking lots of water.  You have a follow-up appointment scheduled for November 1st 10:45 AM with Dr. Eda Keys.   Persistent cough: We will order pulmonary function testing today.  Someone will call you to schedule an appointment to have this testing done.  I think it is great that you are considering Chantix, please let your doctor know at your next appointment.  Neck muscle spasm: You have a muscle spasm in one of the muscles that goes from your shoulder to your neck.  Please use a heating pad to relax the muscle.  Gentle massage by willing friend will also be very helpful in relaxing the muscle.   My Chart Access: [email or web address]  Please follow-up on November 1st, 10:45 AM with your primary care provider.  Please make sure to arrive 15 minutes prior to your next appointment. If you arrive late, you may be asked to reschedule.    We look forward to seeing you next time. Please call our clinic at [phone] if you have any questions or concerns. The best time to call is Monday-Friday from 9am-4pm, but there is someone available 24/7. If after hours or the weekend, call the main hospital number and ask for the Internal Medicine Resident On-Call. If you need medication refills, please notify your pharmacy one week in advance and they will send [name] a request.   Thank you for letting us take part in your care. Wishing you the best!  Ellison Carwin, MD 01/08/2021, 5:01 PM IM Resident, PGY-1

## 2021-01-08 NOTE — Progress Notes (Signed)
  CC: ED follow up appointment, persistent cough, neck pain  HPI:  Barbara Simon is a 45 y.o. female with a past medical history stated below and presents today for ED follow up visit for epigastric discomfort and nausea/vomiting, also having persistent cough and L neck pain. Please see problem based assessment and plan for additional details.  Past Medical History:  Diagnosis Date   Abnormal Pap smear    LSIL   Anxiety    Bipolar 1 disorder (HCC)    Depression    Hepatitis C    two years ago was dx   Ovarian cyst    Thyrotoxicosis     Current Outpatient Medications on File Prior to Visit  Medication Sig Dispense Refill   albuterol (VENTOLIN HFA) 108 (90 Base) MCG/ACT inhaler Inhale 2 puffs into the lungs every 6 (six) hours as needed for wheezing or shortness of breath. 8 g 2   Buprenorphine HCl-Naloxone HCl 8-2 MG FILM Place 1 Film under the tongue in the morning and at bedtime. 60 each 0   gabapentin (NEURONTIN) 300 MG capsule Take 1 capsule (300 mg total) by mouth 3 (three) times daily. 90 capsule 2   guaiFENesin (ROBITUSSIN) 100 MG/5ML SOLN Take 5 mLs (100 mg total) by mouth every 4 (four) hours as needed for cough or to loosen phlegm. 236 mL 0   ondansetron (ZOFRAN-ODT) 4 MG disintegrating tablet Take 1 tablet (4 mg total) by mouth every 8 (eight) hours as needed for nausea or vomiting. 15 tablet 0   No current facility-administered medications on file prior to visit.    Family History  Problem Relation Age of Onset   Cancer Maternal Grandmother    Cancer Maternal Aunt     Social History: Currently still smoking 1PPD, interesting in quitting  Review of Systems: ROS negative except for what is noted on the assessment and plan.  Vitals:   01/08/21 1546  BP: 96/67  Pulse: 95  Temp: 98.2 F (36.8 C)  TempSrc: Oral  SpO2: 99%  Weight: 179 lb 14.4 oz (81.6 kg)  Height: 5\' 3"  (1.6 m)     Physical Exam: General: Well appearing, caucasian female, NAD HENT:  normocephalic, atraumatic, MMM EYES: conjunctiva non-erythematous, no scleral icterus CV: regular rate, normal rhythm, no murmurs, rubs, gallops. Pulmonary: normal work of breathing on RA, occasional wheeze R upper lobe Abdominal: non-distended, soft, epigastric tenderness without guarding, normal BS Skin: Warm and dry, no rashes or lesions Neurological: MS: awake, alert and oriented x3, normal speech and fund of knowledge Motor: moves all extremities antigravity Neck: ROM limited with rotation to left and with neck extension. No tenderness to palpation cervical spinous processes. Tender to palpation of L trapezius with tight muscle spasm identified. Spurling test positive. No grip weakness.  Psych: normal affect    Assessment & Plan:   See Encounters Tab for problem based charting.  Patient seen with Dr. , M.D. [facility] Internal [facility], PGY-1 Pager: [phone] Date 01/08/2021 Time 3:53 PM

## 2021-01-09 ENCOUNTER — Ambulatory Visit: Payer: 59 | Admitting: Behavioral Health

## 2021-01-09 ENCOUNTER — Encounter: Payer: Self-pay | Admitting: Internal Medicine

## 2021-01-09 ENCOUNTER — Telehealth: Payer: Self-pay | Admitting: Behavioral Health

## 2021-01-09 DIAGNOSIS — R1013 Epigastric pain: Secondary | ICD-10-CM | POA: Insufficient documentation

## 2021-01-09 DIAGNOSIS — M62838 Other muscle spasm: Secondary | ICD-10-CM | POA: Insufficient documentation

## 2021-01-09 NOTE — Assessment & Plan Note (Addendum)
Patient presents with new onset neck pain causing left neck discomfort and left forefinger numbness. Patient reports tingling in L index finger, no numbness, no weakness. On exam, ROM limited with rotation to left and with neck extension. No tenderness to palpation cervical spinous processes. Tender to palpation of L trapezius with tight muscle spasm identified.   Plan: -Encouraged supportive care such as heat and gentle massage -If patient continues to have neck muscle spasm by next follow up appointment can consider referral to Dr. Berline Chough with [facility] for tigger point injection

## 2021-01-09 NOTE — Assessment & Plan Note (Addendum)
Patient presents to [facility] for ED follow up visit and reports persistence of her cough that started 1-2 months ago. Has been evaluated at urgent care and at [facility] for cough. At most recent visit 10/10, cough was thought to be secondary to COPD exacerbation in patient without diagnosis of COPD vs bronchitis vs viral pneumonia though CXR negative for acute processes at that time. Patient was instructed to start prednisone 5d course and supportive care. Today she reports improvement in cough frequency though persistence of symptoms. She continues to smoke 1 PPD.   On exam, occasional wheezing R lung. Lab work from ED visit 4 days prior shows bicarb 34, possibly retaining co2. No obvious hyperinflation or emphysematous changes on CXR. Given long smoking history patient should be evaluated for COPD with PFTs. Reassuring that symptoms have improved since treatment initiated for COPD exacerbation at previous visit and afebrile today.  Patient expressed interest in starting Chantix. Was not able to further discuss this visit though feel this would be an excellent idea.  Plan: -PFTs ordered -Further discussion of Chantix at follow up visit Nov 1st

## 2021-01-09 NOTE — Assessment & Plan Note (Signed)
On chart review, patient had colonoscopy performed at [facility] in 12/2017 for evaluation of rectal bleeding.  Report shows patient has small hemorrhoids which were suspected to be the source of bleeding.  Patient also had 4 mm sessile polyp which was removed.  Patient was encouraged to start MiraLAX and was instructed to return for colonoscopy in 1 year though was lost to follow-up.  Plan: -Address colon cancer screening at future PCP visit

## 2021-01-09 NOTE — Telephone Encounter (Signed)
Unable to lv msg for Pt. Call could not be completed & then Mbox is full msg on phone.  Dr. Monna Fam

## 2021-01-09 NOTE — Assessment & Plan Note (Addendum)
Patient presents for ED follow up visit. Was seen at [facility] ED on 10/21 for 2 day hx of epigastric pain with nausea and vomiting. Exam was benign, CT abd pelvis negative for acute process. Labwork unremarkable. Patient was treated with pain medication, antiemetic, PPI. She was discharged on Pepcid 20mg  BID. This medication was too expensive for her ($80) so pharmacy recommended Prilosec and she has been taking 20mg  Prilosec once a day. Her symptoms have improved however she is still having daily nausea and vomiting most mornings, yellow emesis. She endorses a fullness and discomfort in her epigastric area with pressure pushing upward. She denies hematochezia. She is having looser BMs despite hx of constipation with suboxone requiring miralax, not currently taking miralax. Stool is light brown, she has daily BMs 1-2.   On exam, patient's BP is soft at 96/67. She has epigastric tenderness without guarding. Labwork at ED visit did show corrected calcium of 11.2 which is likely due to dehydration secondary to upper GI loss though hypercalcemia could also contribute to nausea/vomiting. Given her description of abdominal discomfort and pressure pushing upward, hiatal hernia is high on the differential. Her symptoms improved with Prilosec though she is taking a lower dose than was intended with   Plan: -Increase OTC Prilosec to 40mg  BID -Increase fluid intake -F/u with PCP on Nov 1st -Recheck Calcium next appointment

## 2021-01-15 ENCOUNTER — Other Ambulatory Visit: Payer: Self-pay

## 2021-01-15 ENCOUNTER — Encounter: Payer: Self-pay | Admitting: Internal Medicine

## 2021-01-15 ENCOUNTER — Ambulatory Visit (INDEPENDENT_AMBULATORY_CARE_PROVIDER_SITE_OTHER): Payer: 59 | Admitting: Internal Medicine

## 2021-01-15 VITALS — BP 113/76 | HR 80 | Temp 98.1°F | Ht 63.0 in | Wt 177.8 lb

## 2021-01-15 DIAGNOSIS — R053 Chronic cough: Secondary | ICD-10-CM | POA: Diagnosis not present

## 2021-01-15 DIAGNOSIS — R1013 Epigastric pain: Secondary | ICD-10-CM | POA: Diagnosis not present

## 2021-01-15 DIAGNOSIS — F1190 Opioid use, unspecified, uncomplicated: Secondary | ICD-10-CM | POA: Diagnosis not present

## 2021-01-15 DIAGNOSIS — Z23 Encounter for immunization: Secondary | ICD-10-CM | POA: Diagnosis not present

## 2021-01-15 DIAGNOSIS — M62838 Other muscle spasm: Secondary | ICD-10-CM

## 2021-01-15 DIAGNOSIS — Z720 Tobacco use: Secondary | ICD-10-CM

## 2021-01-15 DIAGNOSIS — G6289 Other specified polyneuropathies: Secondary | ICD-10-CM | POA: Diagnosis not present

## 2021-01-15 MED ORDER — VARENICLINE TARTRATE 1 MG PO TABS: ORAL_TABLET | ORAL | 0 refills | Status: DC

## 2021-01-15 MED ORDER — TIZANIDINE HCL 4 MG PO TABS: 4.0000 mg | ORAL_TABLET | Freq: Three times a day (TID) | ORAL | 0 refills | Status: AC | PRN

## 2021-01-15 MED ORDER — BUPRENORPHINE HCL-NALOXONE HCL 8-2 MG SL FILM: 1.0000 | ORAL_FILM | Freq: Two times a day (BID) | SUBLINGUAL | 0 refills | Status: DC

## 2021-01-15 NOTE — Assessment & Plan Note (Signed)
[name] states her neuropathy is very well controlled with gabapentin.  Assessment/plan: Neuropathy of unknown etiology with negative work-up thus far.  Well-controlled at this time.  -Patient has refills of gabapentin available to pharmacy

## 2021-01-15 NOTE — Assessment & Plan Note (Signed)
Barbara Simon states that she is ready to quit smoking at this time and would like to do so with the aid of Chantix.  She notes previous cessation in the past for 4 and half years and only restarted after she was released from prison.  She feels she has a good support system at home to support her during this process including her boyfriend and mother, however her sister also lives in the house and continues to smoke.   Assessment/plan: - Chantix prescribed with titration instructions to goal dose of 1 mg twice daily - Instructed to pick a stop date 2 weeks after starting Chantix - 4-week follow-up

## 2021-01-15 NOTE — Assessment & Plan Note (Addendum)
Ms. Marcelle Overlie states that her epigastric pain has improved and she mostly notices it during the morning time now.  It does not feel as a pain exactly but she just more notices that the epigastric area feels in unusual.  She continues to have rather quick onset nausea with vomiting every 2 to 3 days, but this is becoming more spaced out compared to prior.  She denies any previous abdominal surgery before.  She does feel like the Pepcid is helping.  She has noticed that cigarette smoke seems to aggravate her symptoms.  Assessment/plan: Previous visits and imaging/lab work reviewed regarding this problem.  At urgent care, she was noted to be hypotensive with negative CT imaging, however elevated lipase approximately 3 times upper limit of normal.  At the time, provider did not feel pancreatitis is the likely etiology and patient was discharged home.  On re-evaluation in the clinic, there was concern this may be GERD/PUD and Pepcid dose was uptitrated.    At this point, differential is broad but includes biliary colic VS acute pancreatitis with improvement VS gastroparesis secondary to medication adverse effect (Suboxone) VS GERD/PUD.  Will rule out biliary colic with a right upper quadrant ultrasound and if results are negative with persistence in symptoms, patient will need referral to GI for consideration of endoscopy.  - For now, continue Pepcid and avoid NSAIDs - Right upper quadrant ultrasound ordered - Follow-up in 4 weeks or sooner if symptoms worsen  Addendum:  RUQ ultrasound negative for biliary abnormalities. Discussed with Mrs. [name] via telephone. She notes that nause with vomiting has persisted since our visit 2 weeks ago.   - Start Protonix 40 mg daily before breakfast - Referral to GI for consideration of endoscopy

## 2021-01-15 NOTE — Assessment & Plan Note (Signed)
[name] states that she continues to have persistent left sided neck spasm.  She has been following instructions from her last visit including massage and heat application without significant relief in symptoms.  Assessment/plan: On examination, significant left-sided trapezius muscle spasm.  Given failure of heat massage, will trial topical agents and oral muscle relaxers.  - Tizanidine 4 mg every 8 hours as needed.  Counseled on possible side effects including excessive sedation.  Patient understands she should not drive or operate heavy machinery until she knows how this medication affects her - Topical agents recommended such as Salonpas versus Biofreeze - 4-week follow-up

## 2021-01-15 NOTE — Progress Notes (Signed)
CC: OUD; N/V abdominal pain; Cough  HPI:  Barbara Simon is a 45 y.o. with a PMHx as listed below who presents to the clinic for OUD; N/V abdominal pain; Cough.   Please see the Encounters tab for problem-based Assessment & Plan regarding status of patient's acute and chronic conditions.  Past Medical History:  Diagnosis Date   Abnormal Pap smear    LSIL   Anxiety    Bipolar 1 disorder (HCC)    Depression    Hepatitis C    two years ago was dx   Ovarian cyst    Thyrotoxicosis    Review of Systems: Review of Systems  Constitutional:  Negative for chills, fever, malaise/fatigue and weight loss.  HENT:  Negative for congestion and sore throat.   Respiratory:  Positive for cough. Negative for shortness of breath and wheezing.   Cardiovascular:  Negative for chest pain, palpitations, orthopnea, claudication, leg swelling and PND.  Gastrointestinal:  Positive for abdominal pain, nausea and vomiting. Negative for constipation and diarrhea.  Genitourinary:  Negative for flank pain.  Musculoskeletal:  Positive for neck pain.  Skin:  Negative for itching and rash.  Neurological:  Negative for dizziness, focal weakness, weakness and headaches.   Physical Exam:  Vitals:   01/15/21 1106  BP: 113/76  Pulse: 80  Temp: 98.1 F (36.7 C)  TempSrc: Oral  SpO2: 98%  Weight: 177 lb 12.8 oz (80.6 kg)  Height: 5\' 3"  (1.6 m)   Physical Exam Vitals and nursing note reviewed.  Constitutional:      General: She is not in acute distress.    Appearance: She is normal weight.  HENT:     Head: Normocephalic and atraumatic.     Mouth/Throat:     Mouth: Mucous membranes are moist.     Pharynx: Oropharynx is clear.  Eyes:     General: No scleral icterus.    Extraocular Movements: Extraocular movements intact.     Conjunctiva/sclera: Conjunctivae normal.     Pupils: Pupils are equal, round, and reactive to light.  Cardiovascular:     Rate and Rhythm: Normal rate and regular rhythm.      Heart sounds: No murmur heard.   No gallop.  Pulmonary:     Effort: Pulmonary effort is normal. No tachypnea, bradypnea, accessory muscle usage or respiratory distress.     Breath sounds: Normal breath sounds. No stridor. No wheezing, rhonchi or rales.     Comments: Coughs triggered with deep breathes.  Abdominal:     General: Bowel sounds are normal. There is no distension.     Palpations: Abdomen is soft. There is no hepatomegaly or mass.     Tenderness: There is abdominal tenderness (Mild) in the right upper quadrant and epigastric area. There is no guarding.     Hernia: No hernia is present.  Musculoskeletal:     Right lower leg: No edema.     Left lower leg: No edema.  Skin:    General: Skin is warm and dry.  Neurological:     General: No focal deficit present.     Mental Status: She is alert and oriented to person, place, and time. Mental status is at baseline.  Psychiatric:        Mood and Affect: Mood normal.        Behavior: Behavior normal.        Thought Content: Thought content normal.        Judgment: Judgment normal.    Assessment &  Plan:   See Encounters Tab for problem based charting.  Patient discussed with Dr. Evette Doffing

## 2021-01-15 NOTE — Assessment & Plan Note (Signed)
Barbara Simon states that her IUD is very well controlled with current dose of Suboxone.  She denies any cravings.  Assessment/plan: Previous ToxAssure is are appropriate.  We will obtain today given its been about 3 months.  - Refilled Suboxone - Tox assure pending - 66-month follow-up

## 2021-01-15 NOTE — Patient Instructions (Addendum)
It was nice seeing you today! Thank you for choosing [facility] for your Primary Care.    Today we talked about:   Nausea with vomiting and stomach pain: At this time, I am unsure on what may have triggered this. I am glad you are feeling better though! I would like to rule out gallstones and the CT imaging done in the Urgent care is not the best picture for it. I will order an ultrasound to be done of your liver and gallbladder.  For now, continue to take Pepcid   Cough: I will look into scheduling your lung function tests. Continue to keep your albuterol inhaler on you  Smoking: I have sent in Chantix, also called Varenicline.  Start with 1/2 a tablet once per day. After 3 days, increase to 1/2 a tablet, twice per day. Then at Day 6 or 7, increase to a full tablet, twice per day.  If you experience nausea or sleep disturbances, let me know!  I recommend picking a stop date approximately 2 weeks after starting Chantix. Make sure to prepare for it by throwing away all cigarettes in the home, as well as lighters, lighter trays, etc.   Neck muscle spasm: I have sent in a muscle relaxer called Tizanidine. You can take 1 tablet every 8 hours as needed. I would recommend also doing:  Salonpas (lidocaine) patches OR Biofreeze patches OR Tiger Balm patches

## 2021-01-15 NOTE — Assessment & Plan Note (Addendum)
Barbara Simon states that she continues to have a persistent cough that occurs both during the daytime and nighttime.  She is unsure of any potential triggers but does feel like her tobacco use is aggravating her symptoms.  She has not received a call regarding scheduling PFTs yet.  She has been keeping her albuterol inhaler on her and only uses it when necessary given she dislikes the tachycardia that occurs.  She does feel like the albuterol is helping somewhat.  Assessment/plan: - PFTs pending - Continue albuterol as needed - Tobacco cessation  Addendum: PFTs demonstrate mild obstructive pattern that was significantly responsive to inhalers. This is consistent with asthma. Patient states she requires her inhaler at at least 2 days out of the week but not every day. Will go ahead and start maintenance ICS.   - Flovent HFA BID - 2 week follow up

## 2021-01-16 NOTE — Progress Notes (Signed)
Internal Medicine Clinic Attending  Case discussed with Dr. Basaraba  At the time of the visit.  We reviewed the resident's history and exam and pertinent patient test results.  I agree with the assessment, diagnosis, and plan of care documented in the resident's note.  

## 2021-01-18 ENCOUNTER — Telehealth: Payer: Self-pay | Admitting: *Deleted

## 2021-01-18 NOTE — Telephone Encounter (Signed)
Call to patient to notify of appointment for PFT's.   Message was left for patient to call the Clinics about appointment for PFT's.  RTC from patient was given appointment for PFT's on 01/23/2021 at 11:00 AM to arrive in Admitting [facility] by 10:45 AM.   Patient was informed not to smoke, drink Caffeine or use an inhaler at least 4 hours prior to the test. Patient to have Covid Test done on 01/21/2021 between the hours of 8 AM to 3 PM.  Patient was given address of [address] and told go past [employer] to the [employer] and go to the back of the blding to a tent located in the back and that someone will come to her car to do the testing for Covid.  Patient voiced understanding of the plan and will go on Monday.Patient asked about scheduled appointment for Liver US informed patient that she will be called with that date.  Will also speak with CLissa Hoard to see when [name] has been scheduled.  Angelina Ok, RN 01/18/2021 12:09 PM.

## 2021-01-21 ENCOUNTER — Other Ambulatory Visit: Payer: Self-pay | Admitting: Internal Medicine

## 2021-01-21 LAB — TOXASSURE SELECT,+ANTIDEPR,UR

## 2021-01-21 LAB — SARS CORONAVIRUS 2 (TAT 6-24 HRS): SARS Coronavirus 2: NEGATIVE

## 2021-01-23 ENCOUNTER — Ambulatory Visit (HOSPITAL_COMMUNITY)
Admission: RE | Admit: 2021-01-23 | Discharge: 2021-01-23 | Disposition: A | Discharge status: Home / Self Care | Payer: 59 | Source: Ambulatory Visit | Attending: Student in an Organized Health Care Education/Training Program | Admitting: Student in an Organized Health Care Education/Training Program

## 2021-01-23 DIAGNOSIS — Z720 Tobacco use: Secondary | ICD-10-CM | POA: Insufficient documentation

## 2021-01-23 DIAGNOSIS — R052 Subacute cough: Secondary | ICD-10-CM | POA: Diagnosis present

## 2021-01-23 LAB — PULMONARY FUNCTION TEST
DL/VA % pred: 94 %
DL/VA: 4.19 ml/min/mmHg/L
DLCO unc % pred: 95 %
DLCO unc: 19.92 ml/min/mmHg
FEF 25-75 Post: 2.33 L/sec
FEF 25-75 Pre: 1.26 L/sec
FEF2575-%Change-Post: 85 %
FEF2575-%Pred-Post: 78 %
FEF2575-%Pred-Pre: 42 %
FEV1-%Change-Post: 20 %
FEV1-%Pred-Post: 86 %
FEV1-%Pred-Pre: 71 %
FEV1-Post: 2.48 L
FEV1-Pre: 2.05 L
FEV1FVC-%Change-Post: 4 %
FEV1FVC-%Pred-Pre: 83 %
FEV6-%Change-Post: 16 %
FEV6-%Pred-Post: 100 %
FEV6-%Pred-Pre: 86 %
FEV6-Post: 3.47 L
FEV6-Pre: 2.99 L
FEV6FVC-%Change-Post: 0 %
FEV6FVC-%Pred-Post: 101 %
FEV6FVC-%Pred-Pre: 101 %
FVC-%Change-Post: 15 %
FVC-%Pred-Post: 98 %
FVC-%Pred-Pre: 84 %
FVC-Post: 3.48 L
FVC-Pre: 3 L
Post FEV1/FVC ratio: 71 %
Post FEV6/FVC ratio: 100 %
Pre FEV1/FVC ratio: 68 %
Pre FEV6/FVC Ratio: 100 %
RV % pred: 158 %
RV: 2.53 L
TLC % pred: 116 %
TLC: 5.7 L

## 2021-01-23 MED ORDER — ALBUTEROL SULFATE (2.5 MG/3ML) 0.083% IN NEBU
2.5000 mg | INHALATION_SOLUTION | Freq: Once | RESPIRATORY_TRACT | Status: AC
  Administered 2021-01-23: 2.5 mg via RESPIRATORY_TRACT

## 2021-01-25 ENCOUNTER — Other Ambulatory Visit: Payer: Self-pay

## 2021-01-25 ENCOUNTER — Ambulatory Visit (HOSPITAL_COMMUNITY)
Admission: RE | Admit: 2021-01-25 | Discharge: 2021-01-25 | Disposition: A | Discharge status: Home / Self Care | Payer: 59 | Source: Ambulatory Visit | Attending: Internal Medicine | Admitting: Internal Medicine

## 2021-01-25 DIAGNOSIS — R1013 Epigastric pain: Secondary | ICD-10-CM | POA: Insufficient documentation

## 2021-01-30 ENCOUNTER — Ambulatory Visit: Payer: 59 | Admitting: Behavioral Health

## 2021-01-30 MED ORDER — FLUTICASONE PROPIONATE HFA 44 MCG/ACT IN AERO: 1.0000 | INHALATION_SPRAY | Freq: Two times a day (BID) | RESPIRATORY_TRACT | 1 refills | Status: DC

## 2021-01-30 MED ORDER — PANTOPRAZOLE SODIUM 40 MG PO TBEC: 40.0000 mg | DELAYED_RELEASE_TABLET | Freq: Every day | ORAL | 2 refills | Status: DC

## 2021-01-30 NOTE — Addendum Note (Signed)
Addended by: Verdene Lennert on: 01/30/2021 09:49 AM   Modules accepted: Orders

## 2021-01-30 NOTE — Progress Notes (Signed)
Internal Medicine Clinic Attending ° °I saw and evaluated the patient.  I personally confirmed the key portions of the history and exam documented by Dr. Zinoviev and I reviewed pertinent patient test results.  The assessment, diagnosis, and plan were formulated together and I agree with the documentation in the resident’s note.  °

## 2021-02-12 ENCOUNTER — Encounter: Payer: Self-pay | Admitting: Internal Medicine

## 2021-02-12 ENCOUNTER — Other Ambulatory Visit: Payer: Self-pay

## 2021-02-12 ENCOUNTER — Ambulatory Visit (INDEPENDENT_AMBULATORY_CARE_PROVIDER_SITE_OTHER): Payer: 59 | Admitting: Internal Medicine

## 2021-02-12 VITALS — BP 126/83 | HR 85 | Temp 98.7°F | Ht 63.0 in | Wt 177.8 lb

## 2021-02-12 DIAGNOSIS — J4530 Mild persistent asthma, uncomplicated: Secondary | ICD-10-CM

## 2021-02-12 DIAGNOSIS — R1013 Epigastric pain: Secondary | ICD-10-CM

## 2021-02-12 DIAGNOSIS — Z720 Tobacco use: Secondary | ICD-10-CM

## 2021-02-12 DIAGNOSIS — F439 Reaction to severe stress, unspecified: Secondary | ICD-10-CM

## 2021-02-12 NOTE — Progress Notes (Signed)
   CC: Cough and abdominal pain follow up  HPI:  [name] is a 45 y.o. with a PMHx as listed below who presents to the clinic for Cough and abdominal pain follow up.   Please see the Encounters tab for problem-based Assessment & Plan regarding status of patient's acute and chronic conditions.  Past Medical History:  Diagnosis Date   Abnormal Pap smear    LSIL   Anxiety    Bipolar 1 disorder (HCC)    Depression    Hepatitis C    two years ago was dx   Ovarian cyst    Thyrotoxicosis    Review of Systems: Review of Systems  Constitutional:  Negative for chills, fever, malaise/fatigue and weight loss.  Respiratory:  Positive for cough. Negative for sputum production.   Cardiovascular:  Negative for chest pain and palpitations.  Gastrointestinal:  Positive for abdominal pain and nausea. Negative for constipation, diarrhea and vomiting.  Skin:  Negative for itching and rash.  Neurological:  Negative for dizziness, focal weakness and headaches.  Psychiatric/Behavioral:  The patient is nervous/anxious. The patient does not have insomnia.    Physical Exam:  Vitals:   02/12/21 1037  BP: 126/83  Pulse: 85  Temp: 98.7 F (37.1 C)  TempSrc: Oral  SpO2: 100%  Weight: 177 lb 12.8 oz (80.6 kg)  Height: 5\' 3"  (1.6 m)   Physical Exam Vitals and nursing note reviewed.  Constitutional:      Appearance: She is overweight.  HENT:     Head: Normocephalic and atraumatic.  Eyes:     Extraocular Movements: Extraocular movements intact.     Pupils: Pupils are equal, round, and reactive to light.  Cardiovascular:     Rate and Rhythm: Normal rate and regular rhythm.     Heart sounds: No murmur heard. Pulmonary:     Effort: Pulmonary effort is normal. No respiratory distress.     Breath sounds: No wheezing, rhonchi or rales.  Abdominal:     General: Bowel sounds are normal.     Palpations: Abdomen is soft.  Skin:    General: Skin is warm and dry.  Neurological:      General: No focal deficit present.     Mental Status: She is alert and oriented to person, place, and time. Mental status is at baseline.  Psychiatric:        Mood and Affect: Mood normal.        Behavior: Behavior normal.        Thought Content: Thought content normal.        Judgment: Judgment normal.    Assessment & Plan:   See Encounters Tab for problem based charting.  Patient discussed with Dr. 

## 2021-02-12 NOTE — Assessment & Plan Note (Signed)
At patient's last visit, PFTs were ordered and recently obtained that showed mild obstructive pattern with significant response to inhalers consistent with asthma.  Due to requirement of daily albuterol, [name] was started on daily Flovent.  She has been using this daily since that visit and endorses significant improvement in her symptoms.  She is no longer coughing throughout the night and her family has commented on how much better everyone is able to sleep.  She is only requiring her albuterol a couple times a week now.  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  Assessment/plan: Symptoms consistent with mild persistent asthma.  Given significant improvement with maintenance ICS, will continue at this time.  - Continue Flovent twice daily - Continue albuterol as needed - Follow-up 2 to 3 months

## 2021-02-12 NOTE — Patient Instructions (Addendum)
It was nice seeing you today! Thank you for choosing [facility] for your Primary Care.    Today we talked about:   Upcoming Appointment with Dr. Monna Fam:  02/27/2021 1:30 PM Winstead, Lawana Chambers, LMFT   Asthma: I am glad to hear your cough is so much better! Continue with Flovent twice per day and Albuterol as needed. Continue to avoid triggers as well  Stomach Pain: Take Protonix 40 mg once daily, preferably about 1 hour before your first meal of the day. You can take Pepcid as needed as well. The GI doctors will call you for an appointment.   Stress and Anxiety: Let me know if there is anything I can do to help with this. I am glad you are meeting with Dr. Monna Fam!   Quitting smoking: Let's hold off on trying any other medication for now.   Follow up:  - Schedule an appointment for a Pap Smear only as soon as you can - Schedule a visit with me and the OUD clinic in 2 months.

## 2021-02-12 NOTE — Assessment & Plan Note (Signed)
[name] states that she has been taking daily Protonix in addition to Pepcid twice daily.  Her nausea and epigastric abdominal pain has significantly improved but continued to persist.  She is not vomiting quite as often any longer.  She denies any stool changes.  Assessment/plan: - Counseled that she can continue with Protonix daily only - GI referral already placed with scheduling pending

## 2021-02-12 NOTE — Assessment & Plan Note (Signed)
[name] states that she attempted to start Chantix to prepare for smoking cessation but developed significant GI upset.  She has not restarted it since.    Assessment/plan: Given her uncontrolled anxiety at this time, Wellbutrin may not be the best agent for her.  We will hold off on trialing any other agents at this time  - Readdress at future visits

## 2021-02-12 NOTE — Assessment & Plan Note (Signed)
[name] states that she continues to have significant stressors at home which includes her recent job of loss.  She is not currently working at this time and states this is the first time since being released from prison that she was not working.  Its caused significant increases in her anxiety levels.  She knows that she places very high standards for herself and that this sets her up for failure.  At this time, she is not interested in any medication management stating that she has been tried on numerous agents in the past including BuSpar and risperidone.  [name] has missed her follow-up appointments with Dr. Monna Simon and feels very guilty about this.   Assessment/plan: Significant stressors that have led to an increase in anxiety levels.  Symptoms are concerning for generalized anxiety disorder.  We will continue with therapy and hold off on any medication management.  -Follow-up with Dr. Monna Simon already scheduled in December 2022

## 2021-02-21 ENCOUNTER — Telehealth: Payer: Self-pay

## 2021-02-21 ENCOUNTER — Other Ambulatory Visit: Payer: Self-pay | Admitting: *Deleted

## 2021-02-21 DIAGNOSIS — F1190 Opioid use, unspecified, uncomplicated: Secondary | ICD-10-CM

## 2021-02-21 NOTE — Telephone Encounter (Signed)
LOV-02/03/2021 Last Toxassure 01/15/2021 No future scheduled appointment.

## 2021-02-21 NOTE — Telephone Encounter (Signed)
Buprenorphine HCl-Naloxone HCl 8-2 MG FILM, refill request @ [facility] [phone] - [place], [place] - [address] AT [facility] OF [address].

## 2021-02-21 NOTE — Telephone Encounter (Signed)
Last Office visit 02/12/2021.  Last Toxassure 01/15/2021.  No future scheduled appointment.

## 2021-02-22 ENCOUNTER — Other Ambulatory Visit: Payer: Self-pay | Admitting: Student

## 2021-02-22 MED ORDER — BUPRENORPHINE HCL-NALOXONE HCL 8-2 MG SL FILM: 1.0000 | ORAL_FILM | Freq: Two times a day (BID) | SUBLINGUAL | 0 refills | Status: DC

## 2021-02-22 NOTE — Telephone Encounter (Signed)
Reordered one month supply.

## 2021-02-26 NOTE — Progress Notes (Signed)
Internal Medicine Clinic Attending  Case discussed with Dr. Basaraba  At the time of the visit.  We reviewed the resident's history and exam and pertinent patient test results.  I agree with the assessment, diagnosis, and plan of care documented in the resident's note.  

## 2021-02-27 ENCOUNTER — Telehealth: Payer: Self-pay | Admitting: Behavioral Health

## 2021-02-27 ENCOUNTER — Ambulatory Visit: Payer: 59 | Admitting: Behavioral Health

## 2021-02-27 NOTE — Telephone Encounter (Signed)
This is Pt's 2nd [facility] for IBH telehealth session. Lft msg for Pt to [facility] to [facility] & schedule @ her convenience.  Dr. Monna Fam

## 2021-03-25 ENCOUNTER — Other Ambulatory Visit: Payer: Self-pay

## 2021-03-25 ENCOUNTER — Ambulatory Visit (INDEPENDENT_AMBULATORY_CARE_PROVIDER_SITE_OTHER): Payer: 59 | Admitting: Internal Medicine

## 2021-03-25 ENCOUNTER — Encounter: Payer: Self-pay | Admitting: Internal Medicine

## 2021-03-25 VITALS — BP 104/66 | HR 68 | Temp 98.2°F | Resp 24 | Ht 63.0 in | Wt 178.8 lb

## 2021-03-25 DIAGNOSIS — F1190 Opioid use, unspecified, uncomplicated: Secondary | ICD-10-CM

## 2021-03-25 DIAGNOSIS — G5603 Carpal tunnel syndrome, bilateral upper limbs: Secondary | ICD-10-CM | POA: Diagnosis not present

## 2021-03-25 MED ORDER — GABAPENTIN 300 MG PO CAPS: 600.0000 mg | ORAL_CAPSULE | Freq: Three times a day (TID) | ORAL | 0 refills | Status: DC

## 2021-03-26 NOTE — Progress Notes (Signed)
° °  Office Visit   Patient ID: Barbara Simon, female    DOB: [birth year], 46 y.o.   MRN: [id number]   PCP: Jose Persia, MD   Subjective:   Barbara Simon is a 46 y.o. year old female who presents for evaluation of bilateral hand pain and suboxone follow up Please refer to problem based charting for assessment and plan.  Objective:   BP 104/66 (BP Location: Left Arm, Patient Position: Sitting, Cuff Size: Normal)    Pulse 68    Temp 98.2 F (36.8 C) (Oral)    Resp (!) 24    Ht 5\' 3"  (1.6 m)    Wt 178 lb 12.8 oz (81.1 kg)    LMP 03/18/2021    SpO2 100% Comment: room air   BMI 31.67 kg/m   General: well appearing, no distress Psych: normal mood and affect Neurologic exam: diminished sensation of the entire hand bilaterally. Sensation above the wrists are intact.  Tenderness to palpation over the cubital tunnel. + tinnel's test bilaterally. She was unable to tolerate phalen's test due to pain. Hand grip and intrinsic muscle strength is intact. Assessment & Plan:   Problem List Items Addressed This Visit     Carpal tunnel syndrome, bilateral    She has been experiencing worsening bilateral hand pain, tingling, numbness and weakness over the past few months. 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 She has had similar symptoms in the past, prior to incarceration, which had resolved at that point due to cessation of repetitive motions.  The pain has slowly worked its way up her arms, now extending to the mid upper arms. Numbness is limited to the hands but involves the entirety of the hand, not limited to just median or ulnar distributions.  On exam, she has positive tinel's sign and was unable to perform phalen's test due to pain. She also has pain with palpation over the ulnar nerve.  We discussed conservative management options including bracing, which she says she has done in the past. This is reasonable for her to do at night however based on her exam, symptoms, and  inability to modify her work, I do not think this will be sufficient to treat her.  We will start with an EMG to help determine the degree of damage and which nerves in particular are involved. At that point, I would consider referring her to hand surgery for release In the interm, we will increase gabapentin to 600mg  TID      Opioid use disorder - Primary (Chronic)    Currently following with Dr. Charleen Kirks on a 3 month basis. She has been on a stable dose of suboxone since May [id number] and denies illicit opioid use in the interm. Cravings are controlled.  PDMP is appropriate.  Continue current management with suboxone 8-2mg  films BID, #60 per month UDS today Follow up in 3 months      Return in about 3 months (around 06/23/2021). For OUD follow up.   Pt discussed with Dr. Marty Heck, MD Internal Medicine Resident PGY-3 [facility] 03/26/2021 6:05 AM

## 2021-03-26 NOTE — Assessment & Plan Note (Addendum)
Currently following with Dr. Huel Cote on a 3 month basis. She has been on a stable dose of suboxone since May 2022 and denies illicit opioid use in the interm. Cravings are controlled.  PDMP is appropriate.   Continue current management with suboxone 8-2mg  films BID, #60 per month  UDS today  Follow up in 3 months

## 2021-03-26 NOTE — Assessment & Plan Note (Addendum)
She has been experiencing worsening bilateral hand pain, tingling, numbness and weakness over the past few months. 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 She has had similar symptoms in the past, prior to incarceration, which had resolved at that point due to cessation of repetitive motions.  The pain has slowly worked its way up her arms, now extending to the mid upper arms. Numbness is limited to the hands but involves the entirety of the hand, not limited to just median or ulnar distributions.  On exam, she has positive tinel's sign and was unable to perform phalen's test due to pain. She also has pain with palpation over the ulnar nerve.  We discussed conservative management options including bracing, which she says she has done in the past. This is reasonable for her to do at night however based on her exam, symptoms, and inability to modify her work, I do not think this will be sufficient to treat her.   Referred to orthopedics for EMG/NCS  In the interm, we will increase gabapentin to 600mg  TID

## 2021-03-26 NOTE — Progress Notes (Signed)
Internal Medicine Clinic Attending  Case discussed with Dr. Ephriam Knuckles  At the time of the visit.  We reviewed the residents history and exam and pertinent patient test results.  I agree with the assessment, diagnosis, and plan of care documented in the residents note.  Median neuropathy is high on differential; NCS/EMG will be helpful.

## 2021-03-27 ENCOUNTER — Other Ambulatory Visit: Payer: Self-pay

## 2021-03-27 DIAGNOSIS — F1190 Opioid use, unspecified, uncomplicated: Secondary | ICD-10-CM

## 2021-03-27 MED ORDER — BUPRENORPHINE HCL-NALOXONE HCL 8-2 MG SL FILM: 1.0000 | ORAL_FILM | Freq: Two times a day (BID) | SUBLINGUAL | 0 refills | Status: DC

## 2021-03-27 MED ORDER — ALBUTEROL SULFATE HFA 108 (90 BASE) MCG/ACT IN AERS: 2.0000 | INHALATION_SPRAY | Freq: Four times a day (QID) | RESPIRATORY_TRACT | 2 refills | Status: DC | PRN

## 2021-03-27 NOTE — Telephone Encounter (Signed)
albuterol (VENTOLIN HFA) 108 (90 Base) MCG/ACT inhaler  Buprenorphine HCl-Naloxone HCl 8-2 MG FILM, refill request @ [facility] [phone] - [place], [place] - [address] AT [facility] OF [address].

## 2021-03-27 NOTE — Addendum Note (Signed)
Addended by: Elige Radon on: 03/27/2021 12:56 PM   Modules accepted: Orders

## 2021-03-27 NOTE — Telephone Encounter (Signed)
Pt stated that someone tried to return her call from the office about her  ( BUPRENORPHINE )  and she is requesting a call back

## 2021-03-28 ENCOUNTER — Other Ambulatory Visit: Payer: Self-pay

## 2021-03-28 DIAGNOSIS — F1190 Opioid use, unspecified, uncomplicated: Secondary | ICD-10-CM

## 2021-03-28 MED ORDER — BUPRENORPHINE HCL-NALOXONE HCL 8-2 MG SL FILM: 1.0000 | ORAL_FILM | Freq: Two times a day (BID) | SUBLINGUAL | 0 refills | Status: DC

## 2021-03-28 NOTE — Telephone Encounter (Signed)
Not sure why pharmacy could not accept Mullen's rx. Will try prescribing with my DEA and see if that goes through.

## 2021-03-28 NOTE — Addendum Note (Signed)
Addended by: Erlinda Hong T on: 03/28/2021 03:15 PM   Modules accepted: Orders

## 2021-03-28 NOTE — Addendum Note (Signed)
Addended by: Fredderick Severance on: 03/28/2021 11:46 AM   Modules accepted: Orders

## 2021-03-29 ENCOUNTER — Other Ambulatory Visit: Payer: Self-pay | Admitting: Internal Medicine

## 2021-03-29 DIAGNOSIS — G5603 Carpal tunnel syndrome, bilateral upper limbs: Secondary | ICD-10-CM

## 2021-03-29 LAB — TOXASSURE SELECT,+ANTIDEPR,UR

## 2021-04-01 ENCOUNTER — Encounter: Payer: Self-pay | Admitting: Neurology

## 2021-04-12 ENCOUNTER — Encounter: Payer: 59 | Admitting: Physical Medicine and Rehabilitation

## 2021-04-22 ENCOUNTER — Other Ambulatory Visit: Payer: Self-pay

## 2021-04-22 MED ORDER — GABAPENTIN 300 MG PO CAPS: 600.0000 mg | ORAL_CAPSULE | Freq: Three times a day (TID) | ORAL | 0 refills | Status: DC

## 2021-04-24 ENCOUNTER — Other Ambulatory Visit: Payer: Self-pay

## 2021-04-24 DIAGNOSIS — F1190 Opioid use, unspecified, uncomplicated: Secondary | ICD-10-CM

## 2021-04-24 NOTE — Telephone Encounter (Signed)
Buprenorphine HCl-Naloxone HCl 8-2 MG FILM refill request @ [facility] [phone] - [place], [place] - [address] AT [facility] OF [address].

## 2021-04-25 MED ORDER — BUPRENORPHINE HCL-NALOXONE HCL 8-2 MG SL FILM: 1.0000 | ORAL_FILM | Freq: Two times a day (BID) | SUBLINGUAL | 1 refills | Status: DC

## 2021-04-25 NOTE — Telephone Encounter (Signed)
Refilled, however the requirement for X-wavier no longer exists, Dr Huel Cote you can refill this with active DEA

## 2021-04-25 NOTE — Telephone Encounter (Signed)
Patient notified that refill has been sent. She is very Adult nurse.

## 2021-05-01 ENCOUNTER — Other Ambulatory Visit: Payer: Self-pay

## 2021-05-01 DIAGNOSIS — R202 Paresthesia of skin: Secondary | ICD-10-CM

## 2021-05-02 ENCOUNTER — Encounter: Payer: 59 | Admitting: Neurology

## 2021-05-02 ENCOUNTER — Other Ambulatory Visit: Payer: Self-pay

## 2021-05-02 MED ORDER — PANTOPRAZOLE SODIUM 40 MG PO TBEC: 40.0000 mg | DELAYED_RELEASE_TABLET | Freq: Every day | ORAL | 2 refills | Status: DC

## 2021-05-09 ENCOUNTER — Other Ambulatory Visit: Payer: Self-pay

## 2021-05-09 ENCOUNTER — Ambulatory Visit (INDEPENDENT_AMBULATORY_CARE_PROVIDER_SITE_OTHER): Payer: 59 | Admitting: Neurology

## 2021-05-09 ENCOUNTER — Encounter: Payer: Self-pay | Admitting: Physician Assistant

## 2021-05-09 DIAGNOSIS — R202 Paresthesia of skin: Secondary | ICD-10-CM

## 2021-05-09 DIAGNOSIS — G5603 Carpal tunnel syndrome, bilateral upper limbs: Secondary | ICD-10-CM

## 2021-05-09 NOTE — Procedures (Signed)
[facility]  [address] [place], [address]  [place], [place] [address] Tel: [phone] Fax:  [phone] Test Date:  05/09/2021  Patient: Barbara Simon DOB: 16-May-1975 Physician: Nita Sickle, DO  Sex: Female Height: 5\' 3"  Ref Phys: , MD  ID#: Charissa Bash   Technician:    Patient Complaints: This is a 46 year old female referred for evaluation of bilateral hand pain and paresthesias.  NCV & EMG Findings: Extensive electrodiagnostic testing of the right upper extremity and additional studies of the left shows:  Bilateral median sensory responses show prolonged latency (R3.6, L3.8 ms).  Bilateral ulnar sensory responses are within normal limits.  Bilateral median and ulnar motor responses are within normal limits.  There is no evidence of active or chronic motor axonal loss changes affecting any of the tested muscles.  Motor unit configuration and recruitment pattern is within normal limits.    Impression: Bilateral median neuropathy at or distal to the wrist, consistent with a clinical diagnosis of carpal tunnel syndrome.  Overall, these findings are mild in degree electrically.   ___________________________ 57, DO    Nerve Conduction Studies Anti Sensory Summary Table   Stim Site NR Peak (ms) Norm Peak (ms) P-T Amp (V) Norm P-T Amp  Left Median Anti Sensory (2nd Digit)  36C  Wrist    3.8 <3.4 28.0 >20  Right Median Anti Sensory (2nd Digit)  36C  Wrist    3.6 <3.4 23.8 >20  Left Ulnar Anti Sensory (5th Digit)  36C  Wrist    2.3 <3.1 26.8 >12  Right Ulnar Anti Sensory (5th Digit)  36C  Wrist    2.3 <3.1 35.3 >12   Motor Summary Table   Stim Site NR Onset (ms) Norm Onset (ms) O-P Amp (mV) Norm O-P Amp Site1 Site2 Delta-0 (ms) Dist (cm) Vel (m/s) Norm Vel (m/s)  Left Median Motor (Abd Poll Brev)  36C  Wrist    3.4 <3.9 8.1 >6 Elbow Wrist 4.6 27.0 59 >50  Elbow    8.0  7.9         Right Median Motor (Abd Poll Brev)  36C  Wrist     3.6 <3.9 10.7 >6 Elbow Wrist 4.7 26.0 55 >50  Elbow    8.3  9.3         Left Ulnar Motor (Abd Dig Minimi)  36C  Wrist    2.0 <3.1 8.9 >7 B Elbow Wrist 3.4 22.0 65 >50  B Elbow    5.4  8.3  A Elbow B Elbow 1.6 10.0 63 >50  A Elbow    7.0  8.3         Right Ulnar Motor (Abd Dig Minimi)  36C  Wrist    1.8 <3.1 9.8 >7 B Elbow Wrist 3.7 22.0 59 >50  B Elbow    5.5  9.1  A Elbow B Elbow 1.5 10.0 67 >50  A Elbow    7.0  8.9          EMG   Side Muscle Ins Act Fibs Psw Fasc Number Recrt Dur Dur. Amp Amp. Poly Poly. Comment  Right 1stDorInt Nml Nml Nml Nml Nml Nml Nml Nml Nml Nml Nml Nml N/A  Right Abd Poll Brev Nml Nml Nml Nml Nml Nml Nml Nml Nml Nml Nml Nml N/A  Right PronatorTeres Nml Nml Nml Nml Nml Nml Nml Nml Nml Nml Nml Nml N/A  Right Biceps Nml Nml Nml Nml Nml Nml Nml Nml Nml Nml Nml  Nml N/A  Right Triceps Nml Nml Nml Nml Nml Nml Nml Nml Nml Nml Nml Nml N/A  Right Deltoid Nml Nml Nml Nml Nml Nml Nml Nml Nml Nml Nml Nml N/A  Left 1stDorInt Nml Nml Nml Nml Nml Nml Nml Nml Nml Nml Nml Nml N/A  Left Abd Poll Brev Nml Nml Nml Nml Nml Nml Nml Nml Nml Nml Nml Nml N/A  Left PronatorTeres Nml Nml Nml Nml Nml Nml Nml Nml Nml Nml Nml Nml N/A  Left Biceps Nml Nml Nml Nml Nml Nml Nml Nml Nml Nml Nml Nml N/A  Left Triceps Nml Nml Nml Nml Nml Nml Nml Nml Nml Nml Nml Nml N/A  Left Deltoid Nml Nml Nml Nml Nml Nml Nml Nml Nml Nml Nml Nml N/A      Waveforms:

## 2021-05-13 ENCOUNTER — Encounter: Payer: Self-pay | Admitting: Internal Medicine

## 2021-05-13 ENCOUNTER — Other Ambulatory Visit: Payer: Self-pay

## 2021-05-13 ENCOUNTER — Ambulatory Visit (INDEPENDENT_AMBULATORY_CARE_PROVIDER_SITE_OTHER): Payer: 59 | Admitting: Internal Medicine

## 2021-05-13 VITALS — BP 103/69 | HR 102 | Temp 98.2°F | Ht 63.0 in | Wt 171.2 lb

## 2021-05-13 DIAGNOSIS — F419 Anxiety disorder, unspecified: Secondary | ICD-10-CM

## 2021-05-13 DIAGNOSIS — F3170 Bipolar disorder, currently in remission, most recent episode unspecified: Secondary | ICD-10-CM | POA: Diagnosis not present

## 2021-05-13 DIAGNOSIS — G5603 Carpal tunnel syndrome, bilateral upper limbs: Secondary | ICD-10-CM | POA: Diagnosis not present

## 2021-05-13 DIAGNOSIS — Z0000 Encounter for general adult medical examination without abnormal findings: Secondary | ICD-10-CM

## 2021-05-13 MED ORDER — GABAPENTIN 300 MG PO CAPS: 900.0000 mg | ORAL_CAPSULE | Freq: Three times a day (TID) | ORAL | 0 refills | Status: DC

## 2021-05-13 NOTE — Patient Instructions (Signed)
Thank you, [name] for allowing [name] to provide your care today. Today we discussed carpel tunnel syndrome.  I increased your gabapentin to 900mg  three times a day I placed a referral for you to see an orthopedic surgeon I placed a referral for you to talk with Dr.   I will write the work notes for you.  My Chart Access: [email or web address]  Please follow-up as needed for hand pain.  Please make sure to arrive 15 minutes prior to your next appointment. If you arrive late, you may be asked to reschedule.    We look forward to seeing you next time. Please call our clinic at [phone] if you have any questions or concerns. The best time to call is Monday-Friday from 9am-4pm, but there is someone available 24/7. If after hours or the weekend, call the main hospital number and ask for the Internal Medicine Resident On-Call. If you need medication refills, please notify your pharmacy one week in advance and they will send [phone] a request.   Thank you for letting us take part in your care. Wishing you the best!  [name], MD 05/13/2021, 4:28 PM IM Resident, PGY-1

## 2021-05-13 NOTE — Assessment & Plan Note (Addendum)
Patient reports recent depressive symptoms with PHQ-9 score of 24.  Requests to speak with our clinic psychologist Dr. Theodis Shove.  Denies SI.  Reports she thinks her depressive symptoms are related to pain secondary to carpal tunnel syndrome which has become very disruptive to her life.  Otherwise was doing well without depressive symptoms prior to worsening of CTS.  Plan: -Ambulatory referral to integrated behavioral health, Dr. Theodis Shove

## 2021-05-13 NOTE — Progress Notes (Signed)
°  CC: Follow-up bilateral hand pain  HPI:  Barbara Simon is a 46 y.o. female with a past medical history stated below and presents today for follow-up bilateral hand pain. Please see problem based assessment and plan for additional details.  Past Medical History:  Diagnosis Date   Abnormal Pap smear    LSIL   Anxiety    Bipolar 1 disorder (HCC)    Depression    Hepatitis C    two years ago was dx   Ovarian cyst    Thyrotoxicosis     Current Outpatient Medications on File Prior to Visit  Medication Sig Dispense Refill   albuterol (VENTOLIN HFA) 108 (90 Base) MCG/ACT inhaler Inhale 2 puffs into the lungs every 6 (six) hours as needed for wheezing or shortness of breath. 8 g 2   Buprenorphine HCl-Naloxone HCl 8-2 MG FILM Place 1 Film under the tongue in the morning and at bedtime. 60 each 1   fluticasone (FLOVENT HFA) 44 MCG/ACT inhaler Inhale 1 puff into the lungs in the morning and at bedtime. Rinse your mouth with water after each use. 1 each 1   pantoprazole (PROTONIX) 40 MG tablet Take 1 tablet (40 mg total) by mouth daily. 30 tablet 2   No current facility-administered medications on file prior to visit.    Family History  Problem Relation Age of Onset   Cancer Maternal Grandmother    Cancer Maternal Aunt     Review of Systems: ROS negative except for what is noted on the assessment and plan.  Vitals:   05/13/21 1526  BP: 103/69  Pulse: (!) 102  Temp: 98.2 F (36.8 C)  TempSrc: Oral  SpO2: 98%  Weight: 171 lb 3.2 oz (77.7 kg)  Height: 5\' 3"  (1.6 m)     Physical Exam: General: Well appearing Caucasian female, NAD HENT: normocephalic, atraumatic EYES: conjunctiva non-erythematous, no scleral icterus CV: regular rate, normal rhythm, no murmurs, rubs, gallops. Pulmonary: normal work of breathing on RA, lungs clear to auscultation, no rales, wheezes, rhonchi Abdominal: non-distended, soft, non-tender to palpation, normal BS Skin: Warm and dry, no rashes or  lesions Neurological: MS: awake, alert and oriented x3, normal speech and fund of knowledge Motor: moves all extremities antigravity MSK: Tinel's sign and Phalen sign positive bilateral wrists Psych: Crying    Assessment & Plan:   See Encounters Tab for problem based charting.  Patient discussed with Dr. , M.D. [facility] Internal Medicine, PGY-1 Pager: [phone] Date 05/13/2021 Time 6:10 PM

## 2021-05-13 NOTE — Assessment & Plan Note (Addendum)
Patient is due for Pap smear, has history of abnormal Pap smear with LSIL, encouraged patient to make a follow-up appointment in 1 to 2 weeks for Pap smear.

## 2021-05-13 NOTE — Assessment & Plan Note (Addendum)
Patient presents for carpal tunnel follow-up.  Had NCS- EMG performed by neurology, Dr. Allena Katz which showed mild bilateral carpal tunnel syndrome.  Patient reports significant and worsening symptoms, having symptoms waking her from sleep, unable to drive, poor appetite, very much affecting her daily life, PHQ-9 24 today.  Patient breaks down in tears today in clinic.  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  She is adherent to bracing and wears her braces at night and all time she is not at work.  Reports persistent daily numbness and tingling in bilateral hands.  Reports increase in gabapentin to 600 mg 3 times daily has not helped with pain.  Has tried hot and cold therapy, Tiger balm, other unknown creams which have not helped.  Per chart review, patient had been incarcerated and during this time was unable to work at [employer] and had improvement in symptoms.  The symptoms restarted when she went back to work.  On exam Tinel's and Phalen sign positive bilateral wrist.  On assessment, NCS showed mild CTS, though patient is having severe and disruptive symptoms.  Patient has tried conservative management without relief and will benefit from evaluation by orthopedic surgery for surgical management of CTS. discussed continuing conservative management in the meantime.  Plan: -Referral to orthopedic surgery for consideration for surgery for CTS -Increase gabapentin to 900 mg 3 times daily -Work note with restriction of repetitive upper extremity movements provided -Continue conservative management with bracing, hot and cold therapy, OTC Voltaren gel etc

## 2021-05-14 ENCOUNTER — Ambulatory Visit: Payer: 59 | Admitting: Behavioral Health

## 2021-05-14 DIAGNOSIS — F3177 Bipolar disorder, in partial remission, most recent episode mixed: Secondary | ICD-10-CM

## 2021-05-14 NOTE — BH Specialist Note (Signed)
Integrated Behavioral Health via Telemedicine Visit  05/14/2021 Vantasia Signs FM:[id number]  Number of [facility] Clinician visits: 3/6 Session Start time: 10:20am Session End time: 10:50am Total time in minutes: 40 min  Referring Provider: Dr. Leodis Binet, MD Patient/Family location: Pt is in car w/BF [name] driving & consents to him listening to conversation [facility] Provider location: Working remotely All persons participating in visit: Pt, BF, & Clinician Types of Service:  Cpl Therapy w/emphasis on Ind Thearpy  I connected with [name] and/or [employer]  self  via  Telephone or Geologist, engineering  (Video is Tree surgeon) and verified that I am speaking with the correct person using two identifiers. Discussed confidentiality: Yes   I discussed the limitations of telemedicine and the availability of in person appointments.  Discussed there is a possibility of technology failure and discussed alternative modes of communication if that failure occurs.  I discussed that engaging in this telemedicine visit, they consent to the provision of behavioral healthcare and the services will be billed under their insurance.  Patient and/or legal guardian expressed understanding and consented to Telemedicine visit: Yes   Presenting Concerns: Patient and/or family reports the following symptoms/concerns: elevated pain of both wrists w/tearfulness, fatigue, & confusion due to memory issues; Pt c/o lack of good sleep due to pain. Partner related she has bad pain in the evening while trying to sleep due to s/e of CTS. Duration of problem: months; Severity of problem: moderate trending severe  Patient and/or Family's Strengths/Protective Factors: Social and Emotional competence, Concrete supports in place (healthy food, safe environments, etc.), and Sense of purpose  Goals Addressed: Patient will:  Reduce symptoms of: anxiety, depression,  stress, and pain    Increase knowledge and/or ability of: coping skills, healthy habits, self-management skills, and stress reduction. Pain mgmt skills for sleeping.   Demonstrate ability to: Increase healthy adjustment to current life circumstances, Increase adequate support systems for patient/family, and mgmt of pain in wrists which is exacerbated by OUD Tx & triggering events from her past.   Progress towards Goals: Ongoing; Pt was last seen for complete visit on 12/20/20. Pt has been inconsistent w/scheduled appts, but today notes she needs the support of psychotherapy.  Interventions: Interventions utilized:  Solution-Focused Strategies Standardized Assessments completed:  screeners prn  Patient and/or Family Response: Pt receptive to call & very emot'l today. Pt requests future appt in a few wks.   Assessment: Patient currently experiencing elevated emotionality due to pain issues. She is not sleeping well. Pt is obtaining wrist cuffs today that are supportive w/the use of metal spoon materials. Pt is lacking focus & conc skills. 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 Pt lft work w/BF so she can secue wrist cuff supports & determine what to do next.   Pt thinks she needs Celexa @ this time to help her mentally since it worked previously for her.  Patient may benefit from cont'd & consistent mental health support. Pain mgmt techniques & mental health wellness tools.   Plan: Follow up with behavioral health clinician on : 2-3 wks for 30 min on telehealth Behavioral recommendations: Suggestions for pain mgmt, Recovery security, & mindfulness skills. Referral(s): [facility] (In Clinic)  I discussed the assessment and treatment plan with the patient and/or parent/guardian. They were provided an opportunity to ask questions and all were answered. They agreed with  the  plan and demonstrated an understanding of the instructions.   They were advised to call back or seek an in-person evaluation if the symptoms worsen or if the condition fails to improve as anticipated.  Donnetta Hutching, LMFT

## 2021-05-14 NOTE — Progress Notes (Signed)
Internal Medicine Clinic Attending ? ?Case discussed with Dr. Zinoviev  At the time of the visit.  We reviewed the resident?s history and exam and pertinent patient test results.  I agree with the assessment, diagnosis, and plan of care documented in the resident?s note.  ?

## 2021-05-15 ENCOUNTER — Encounter: Payer: Self-pay | Admitting: Internal Medicine

## 2021-05-15 ENCOUNTER — Ambulatory Visit (INDEPENDENT_AMBULATORY_CARE_PROVIDER_SITE_OTHER): Payer: 59 | Admitting: Internal Medicine

## 2021-05-15 ENCOUNTER — Other Ambulatory Visit: Payer: Self-pay

## 2021-05-15 VITALS — BP 93/54 | HR 73 | Temp 97.7°F | Ht 63.0 in | Wt 174.3 lb

## 2021-05-15 DIAGNOSIS — G5603 Carpal tunnel syndrome, bilateral upper limbs: Secondary | ICD-10-CM

## 2021-05-15 DIAGNOSIS — J029 Acute pharyngitis, unspecified: Secondary | ICD-10-CM

## 2021-05-15 DIAGNOSIS — R1013 Epigastric pain: Secondary | ICD-10-CM

## 2021-05-15 DIAGNOSIS — F332 Major depressive disorder, recurrent severe without psychotic features: Secondary | ICD-10-CM

## 2021-05-15 MED ORDER — ONDANSETRON HCL 4 MG PO TABS: 4.0000 mg | ORAL_TABLET | Freq: Three times a day (TID) | ORAL | 0 refills | Status: DC | PRN

## 2021-05-15 MED ORDER — CITALOPRAM HYDROBROMIDE 20 MG PO TABS: 20.0000 mg | ORAL_TABLET | Freq: Every day | ORAL | 2 refills | Status: DC

## 2021-05-15 NOTE — Patient Instructions (Addendum)
It was nice seeing you today! Thank you for choosing [facility] for your Primary Care.  ?  ?Today we talked about:  ? ?Depression:  ?We are restarting Celexa today (generic name: Citalopram). Please start today with 1 tablet daily.  ?Follow up in 4 weeks ?If you begin to have thoughts of harming yourself, please call 911 or head to the nearest ER.  ? ?Carpal Tunnel:  ?I have placed a referral to the [facility] and called to make you an appointment ?Friday, March 3rd at 10:00 with Dr. Milta Deiters ?[id number] ?[phone] ? ?Nausea,Vomiting: Make sure to keep the GI appointment ?I have sent in nausea medication  ? ?Sore Throat: For now, I recommend working on water intake, lozenges and salt water gargles. If it is not improving in the next 48-72 hours, please let me know.  ?

## 2021-05-17 ENCOUNTER — Ambulatory Visit: Payer: 59 | Admitting: Family Medicine

## 2021-05-17 DIAGNOSIS — J029 Acute pharyngitis, unspecified: Secondary | ICD-10-CM | POA: Insufficient documentation

## 2021-05-17 NOTE — Assessment & Plan Note (Signed)
Barbara Simon expresses severe distress due to bilateral hand pain in the setting of bilateral carpal tunnel. At her last visit, a referral to orthopedic surgery was placed but she has not heard from their office. Discussed the benefits of trying glucocorticoid injections first.  ? ?Assessment/Plan: ?- Referral to Sports Medicine for glucocorticoid injections ?- Continue wrist splinting at night ?

## 2021-05-17 NOTE — Assessment & Plan Note (Signed)
[name] endorses 3 day history of sore throat, congestion and sinus fullness. She notes that her sinus fullness is chronic though. She denies any fever but has had chills. No throat swelling.  ? ?Assessment/Plan: ?On examination, there is oropharyngeal erythema but no exudate. Mild submandibular lymphadenopathy. Suspect viral pharyngitis.  ? ?- Supportive care with salt water gargles, lozenges, flonase ?- Instructed to contact clinic if no resolution in 3-5 days ?

## 2021-05-17 NOTE — Progress Notes (Signed)
? ?CC: Depression; Abdominal pain; N/V; sore throat  ? ?HPI: ? ?Ms.Pa Marcelle Overlie is a 46 y.o. with a PMHx as listed below who presents to the clinic for Depression; Abdominal pain; N/V; sore throat .  ? ?Please see the Encounters tab for problem-based Assessment & Plan regarding status of patient's acute and chronic conditions. ? ?Past Medical History:  ?Diagnosis Date  ? Abnormal Pap smear   ? LSIL  ? Anxiety   ? Bipolar 1 disorder (HCC)   ? Depression   ? Hepatitis C   ? two years ago was dx  ? Ovarian cyst   ? Thyrotoxicosis   ? ?Review of Systems: Review of Systems  ?Constitutional:  Positive for chills and malaise/fatigue. Negative for fever and weight loss.  ?HENT:  Positive for congestion, sinus pain and sore throat.   ?Respiratory:  Negative for cough and shortness of breath.   ?Cardiovascular:  Negative for chest pain, palpitations and leg swelling.  ?Gastrointestinal:  Positive for abdominal pain, constipation, heartburn, nausea and vomiting. Negative for blood in stool, diarrhea and melena.  ?Psychiatric/Behavioral:  Positive for depression. Negative for hallucinations, substance abuse and suicidal ideas. The patient is nervous/anxious.   ? ?Physical Exam: ? ?Vitals:  ? 05/15/21 1523 05/15/21 1525  ?BP:  (!) 93/54  ?Pulse:  73  ?Temp:  97.7 ?F (36.5 ?C)  ?TempSrc:  Oral  ?SpO2:  98%  ?Weight: 174 lb 4.8 oz (79.1 kg)   ?Height: 5\' 3"  (1.6 m)   ? ?Physical Exam ?Vitals and nursing note reviewed.  ?Constitutional:   ?   Appearance: She is obese.  ?HENT:  ?   Head: Normocephalic and atraumatic.  ?   Nose:  ?   Right Turbinates: Not enlarged or swollen.  ?   Left Turbinates: Not enlarged or swollen.  ?   Mouth/Throat:  ?   Mouth: Mucous membranes are moist.  ?   Pharynx: Posterior oropharyngeal erythema present. No pharyngeal swelling, oropharyngeal exudate or uvula swelling.  ?   Tonsils: No tonsillar exudate or tonsillar abscesses.  ?Eyes:  ?   Conjunctiva/sclera: Conjunctivae normal.  ?   Pupils: Pupils  are equal, round, and reactive to light.  ?Cardiovascular:  ?   Rate and Rhythm: Normal rate and regular rhythm.  ?   Heart sounds: No murmur heard. ?Pulmonary:  ?   Effort: Pulmonary effort is normal. No respiratory distress.  ?   Breath sounds: Normal breath sounds. No wheezing or rales.  ?Abdominal:  ?   General: Bowel sounds are normal. There is no distension.  ?   Palpations: Abdomen is soft.  ?   Tenderness: There is no abdominal tenderness.  ?Musculoskeletal:  ?   Right lower leg: No edema.  ?   Left lower leg: No edema.  ?Lymphadenopathy:  ?   Cervical: Cervical adenopathy (submandibular, bilateral) present.  ?Skin: ?   General: Skin is warm and dry.  ?Neurological:  ?   General: No focal deficit present.  ?   Mental Status: She is alert and oriented to person, place, and time. Mental status is at baseline.  ?Psychiatric:     ?   Attention and Perception: Attention and perception normal.     ?   Mood and Affect: Mood is anxious and depressed. Affect is tearful.     ?   Speech: Speech normal.     ?   Behavior: Behavior is cooperative.     ?   Thought Content: Thought content does  not include homicidal or suicidal ideation. Thought content does not include homicidal or suicidal plan.     ?   Cognition and Memory: Cognition and memory normal.     ?   Judgment: Judgment normal.  ? ? ?Assessment & Plan:  ? ?See Encounters Tab for problem based charting. ? ?Patient discussed with Dr.  Lafonda Mosses ? ?

## 2021-05-17 NOTE — Assessment & Plan Note (Signed)
[name] states that approximately 3 days ago, her epigastric pain with N/V became uncontrolled once again. She has had 1-2 large volume emesis during this time that is non-bloody. She is still taking Protonix once daily.  ? ?Assessment/Plan: ?Etiology uncertain at this time but symptoms have been ongoing for an extended period of time. Patient has a GI consultation upcoming on the 14th so will work today on supportive management till then. I wonder if there is an aspect of somatization to her current exacerbation given severe depression at this time, however GI symptoms pre-dates her mental health crisis.  ? ?- Continue Protonix daily ?- Start Zofran, q8h PRN ?- Follow up with GI ?

## 2021-05-17 NOTE — Assessment & Plan Note (Addendum)
[name] states she has a long term history of bipolar disorder and depression. In the past, she was treated with Celexa with success. She denies any previous manic episodes in regards to her Bipolar disorder. She feels her depression has become significantly worse over the last few weeks. She has no thoughts of hurting herself but at times thinks about relapsing on opioids.  ? ?PHQ9: 24 ? ?Assessment/Plan: ?Severe depressive episode. No indication for emergent hospitalization. Given she has been previously treated with celexa, will try that again.  ? ?- Continue IBH appointments with Dr. Monna Fam ?- Start Citalopram 20 mg daily ?- 4 week follow up ?

## 2021-05-20 NOTE — Progress Notes (Signed)
Internal Medicine Clinic Attending ? ?Case discussed with Dr. Huel Cote  At the time of the visit.  We reviewed the resident?s history and exam and pertinent patient test results.  I agree with the assessment, diagnosis, and plan of care documented in the resident?s note.  ? ?Sports med referral for injection. ?Resident counseled patient that citalopram may cause GI side effects, but these should improve with time. ?Rest of plan as outlined above  ?

## 2021-05-23 ENCOUNTER — Other Ambulatory Visit: Payer: Self-pay | Admitting: Internal Medicine

## 2021-05-23 DIAGNOSIS — F1190 Opioid use, unspecified, uncomplicated: Secondary | ICD-10-CM

## 2021-05-23 NOTE — Telephone Encounter (Signed)
Refill Request ? ? ?Buprenorphine HCl-Naloxone HCl 8-2 MG FILM ? ? ?[facility] [phone] - [place], [place] - [address] AT [facility] [address] (Ph: [phone] ?

## 2021-05-24 MED ORDER — BUPRENORPHINE HCL-NALOXONE HCL 8-2 MG SL FILM: 1.0000 | ORAL_FILM | Freq: Two times a day (BID) | SUBLINGUAL | 0 refills | Status: DC

## 2021-05-28 ENCOUNTER — Ambulatory Visit: Payer: 59 | Admitting: Physician Assistant

## 2021-05-31 ENCOUNTER — Ambulatory Visit: Payer: 59 | Admitting: Orthopedic Surgery

## 2021-06-07 ENCOUNTER — Ambulatory Visit: Payer: 59 | Admitting: Physician Assistant

## 2021-06-10 ENCOUNTER — Ambulatory Visit: Payer: 59 | Admitting: Behavioral Health

## 2021-06-10 ENCOUNTER — Ambulatory Visit (INDEPENDENT_AMBULATORY_CARE_PROVIDER_SITE_OTHER): Payer: 59 | Admitting: Orthopedic Surgery

## 2021-06-10 ENCOUNTER — Encounter: Payer: Self-pay | Admitting: Orthopedic Surgery

## 2021-06-10 ENCOUNTER — Other Ambulatory Visit: Payer: Self-pay

## 2021-06-10 VITALS — BP 104/69 | HR 67 | Ht 63.0 in | Wt 172.8 lb

## 2021-06-10 DIAGNOSIS — F3177 Bipolar disorder, in partial remission, most recent episode mixed: Secondary | ICD-10-CM

## 2021-06-10 DIAGNOSIS — G5603 Carpal tunnel syndrome, bilateral upper limbs: Secondary | ICD-10-CM | POA: Diagnosis not present

## 2021-06-10 MED ORDER — VITAMIN B-6 100 MG PO TABS: ORAL_TABLET | ORAL | 0 refills | Status: DC

## 2021-06-10 MED ORDER — METHYLPREDNISOLONE 4 MG PO TABS: 4.0000 mg | ORAL_TABLET | Freq: Two times a day (BID) | ORAL | 0 refills | Status: DC

## 2021-06-10 MED ORDER — ALBUTEROL SULFATE HFA 108 (90 BASE) MCG/ACT IN AERS: 2.0000 | INHALATION_SPRAY | Freq: Four times a day (QID) | RESPIRATORY_TRACT | 2 refills | Status: DC | PRN

## 2021-06-10 NOTE — Progress Notes (Signed)
Chief Complaint  ?Patient presents with  ? Wrist Pain  ?  Bilat, R>L ?Pain radiates from shoulder to elbow down into hand ?Job change has helped some with the left wrist  ? ? ?HPI: Barbara Simon is 46 years old she comes in for evaluation for possible bilateral carpal tunnel syndrome she had nerve conduction study done by [facility] neurology on February 23 of this year she has bilateral median sensory responses with prolonged latency bilateral ulnar sensory responses normal motor responses median ulnar nerve normal no evidence of active or chronic motor axonal loss ? ?She says she is in severe pain at times she has had nonoperative treatment which includes copper fit gloves which she says helps her at night with her night pain, she has had gabapentin up to 900 mg. ? ?She still says that she has trouble sleeping she has severe pain and swelling and a dull ache which starts in the hand and radiates up to the shoulder on the right which is worse than the left symptoms on the left seem to be much less involved ? ?She has bipolar type I disorder with depression ? ?She takes bupenorphine naloxone 8-2 mg film under her tongue in the morning and at bedtime ? ?Past Medical History:  ?Diagnosis Date  ? Abnormal Pap smear   ? LSIL  ? Anxiety   ? Bipolar 1 disorder (HCC)   ? Depression   ? Hepatitis C   ? two years ago was dx  ? Ovarian cyst   ? Thyrotoxicosis   ? ? ?BP 104/69   Pulse 67   Ht 5\' 3"  (1.6 m)   Wt 172 lb 12.8 oz (78.4 kg)   BMI 30.61 kg/m?  ? ? ?General appearance: Well-developed well-nourished no gross deformities ? ?Cardiovascular normal pulse and perfusion normal color without edema ? ?Neurologically no sensation loss or deficits or pathologic reflexes ? ?Psychological: Awake alert and oriented x3 mood and affect normal ? ?Skin no lacerations or ulcerations no nodularity no palpable masses, no erythema or nodularity ? ?Musculoskeletal: Both hands are examined.  She says they are swollen the skin is wrinkled so I am  not really seeing the swelling but it may feel swollen to her ? ?She says the right one is bigger than the left ? ?In terms of her range of motion is normal in both hands the wrist joints are stable the sensory changes appear to be only in the median nerve ? ? ? ?Imaging no imaging ? ?The nerve conduction study again is read it is in the chart under neurology ? ? ? ?A/P ? ?46 year old female with positive nerve conduction studies and symptoms far outweighing what seen on nerve conduction study.  She also has some emotional issues as well documented in her record ? ?I offered her surgical and nonsurgical treatment.  She says her significant other who is with her today has to have surgery and she cannot have surgery while he is having his total hip done ? ?She asked about a carpal tunnel injection.  I think that is a reasonable idea but I do not do carpal tunnel injections so I told her we will refer her to someone who does and hopefully Dr. 55 can help Frazier Butt with that and then when she has surgery we can do the carpal tunnel release however ? ?I told her I am not sure the surgery is going to help her and mainly that is because of her psychosocial status and issues.  Not  to mention the issues with the medication ?
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
## 2021-06-10 NOTE — Patient Instructions (Signed)
Referral for Carpal tunnel injection  ?

## 2021-06-14 ENCOUNTER — Ambulatory Visit (INDEPENDENT_AMBULATORY_CARE_PROVIDER_SITE_OTHER): Payer: 59 | Admitting: Internal Medicine

## 2021-06-14 ENCOUNTER — Encounter: Payer: Self-pay | Admitting: Internal Medicine

## 2021-06-14 ENCOUNTER — Other Ambulatory Visit (HOSPITAL_COMMUNITY)
Admission: RE | Admit: 2021-06-14 | Discharge: 2021-06-14 | Disposition: A | Discharge status: Home / Self Care | Payer: 59 | Source: Ambulatory Visit | Attending: Internal Medicine | Admitting: Internal Medicine

## 2021-06-14 VITALS — BP 114/75 | HR 66 | Temp 98.1°F | Ht 63.0 in | Wt 175.8 lb

## 2021-06-14 DIAGNOSIS — N816 Rectocele: Secondary | ICD-10-CM

## 2021-06-14 DIAGNOSIS — G6289 Other specified polyneuropathies: Secondary | ICD-10-CM

## 2021-06-14 DIAGNOSIS — Z124 Encounter for screening for malignant neoplasm of cervix: Secondary | ICD-10-CM

## 2021-06-14 DIAGNOSIS — F332 Major depressive disorder, recurrent severe without psychotic features: Secondary | ICD-10-CM | POA: Diagnosis not present

## 2021-06-14 DIAGNOSIS — Z0000 Encounter for general adult medical examination without abnormal findings: Secondary | ICD-10-CM

## 2021-06-14 DIAGNOSIS — R87810 Cervical high risk human papillomavirus (HPV) DNA test positive: Secondary | ICD-10-CM | POA: Diagnosis not present

## 2021-06-14 DIAGNOSIS — Z1231 Encounter for screening mammogram for malignant neoplasm of breast: Secondary | ICD-10-CM

## 2021-06-14 DIAGNOSIS — F1190 Opioid use, unspecified, uncomplicated: Secondary | ICD-10-CM

## 2021-06-14 MED ORDER — GABAPENTIN 300 MG PO CAPS: 900.0000 mg | ORAL_CAPSULE | Freq: Three times a day (TID) | ORAL | 2 refills | Status: DC

## 2021-06-14 MED ORDER — CITALOPRAM HYDROBROMIDE 40 MG PO TABS: 40.0000 mg | ORAL_TABLET | Freq: Every day | ORAL | 2 refills | Status: DC

## 2021-06-14 NOTE — Assessment & Plan Note (Signed)
[name] states that for the last several weeks, she has noticed that there is a bulge that comes out of her vagina particularly when she is bearing down or having a bowel movement.  This has become quite bothersome for her when she is on her menstrual cycle; she notes she has difficulty placing a tampon due to this protrusion.  She is not experiencing any pain with sexual intercourse.  She denies any urinary incontinence or difficulty. ? ?[name] states she has given birth vaginally to 3 children. ? ?Assessment/plan: ?On examination today, there is evidence of a posterior wall vaginal prolapse that extends to the vaginal opening without any increased intra-abdominal pressure.  I suspect that with intra-abdominal pressure such as during a bowel movement, this is worsened.  We will place a referral to OB/GYN for discussion regarding pessary versus surgery. ? ?- Referral to OB/GYN placed ?

## 2021-06-14 NOTE — Assessment & Plan Note (Signed)
[name] states Gabapentin has been helpful to control her neuropathy symptoms.  Her dose was increased at her visit in February after worsening in her carpal tunnel symptoms.  She has noticed improvement with this dose change.  She is requesting a refill today. ? ?Assessment/plan: ?- Refilled gabapentin 900 mg 3 times daily ?

## 2021-06-14 NOTE — Assessment & Plan Note (Addendum)
Per chart review, patient has a history of Pap smear 2014 that demonstrated negative intraepithelial lesions, however was positive for high risk HPV.  Testing did not differentiate for HPV 16, 18 or 45.  No repeat Pap smear since that time available in the chart. ? ?- Pap smear obtained today.  Results pending ? ?ADDENDUM: ?HPV negative. There is hyperkeratosis noted, so will need to repeat pap smear in 6 months. If hyperkeratosis resolves, can resume normal screening every 5 years. Patient informed and understands.  ?

## 2021-06-14 NOTE — Assessment & Plan Note (Signed)
[name] states that she takes Suboxone 8-2 mg 2 times daily.  She has noticed increased cravings lately but states this is in the setting of increased stress and pain.  Currently, her bilateral wrist pain secondary to carpal tunnel is the most bothersome.  She describes having thoughts of relapsing at times but states her strong support system helps keep her from doing so.  She has not relapsed in many years now. ? ?Assessment/plan: ?- Continue Suboxone 8-2 mg twice daily ?- We discussed our plans to manage her depression and pain.  Patient has upcoming injections planned for her carpal tunnel syndrome on April 11.  In addition, we are treating her depression by further titrating her Celexa. ?

## 2021-06-14 NOTE — Progress Notes (Signed)
? ?  CC: Cervical cancer screening; vaginal bulge; depression ? ?HPI: ? ?[name] is a 46 y.o. with a PMHx as listed below who presents to the clinic for Cervical cancer screening; vaginal bulge; depression.  ? ?Please see the Encounters tab for problem-based Assessment & Plan regarding status of patient's acute and chronic conditions. ? ?Past Medical History:  ?Diagnosis Date  ? Abnormal Pap smear   ? LSIL  ? Anxiety   ? Bipolar 1 disorder (HCC)   ? Depression   ? Hepatitis C   ? two years ago was dx  ? Ovarian cyst   ? Thyrotoxicosis   ? ?Review of Systems: Review of Systems  ?Constitutional:  Positive for malaise/fatigue. Negative for chills, fever and weight loss.  ?Respiratory:  Negative for cough and shortness of breath.   ?Cardiovascular:  Negative for chest pain.  ?Genitourinary:  Negative for dysuria, flank pain, frequency, hematuria and urgency.  ?Musculoskeletal:  Positive for back pain (Chronic) and joint pain (Right shoulder, chronic).  ?Neurological:  Negative for dizziness and headaches.  ?Psychiatric/Behavioral:  Positive for depression. Negative for substance abuse and suicidal ideas. The patient is not nervous/anxious.   ? ?Physical Exam: ? ?Vitals:  ? 06/14/21 1031  ?BP: 114/75  ?Pulse: 66  ?Temp: 98.1 ?F (36.7 ?C)  ?TempSrc: Oral  ?SpO2: 98%  ?Weight: 175 lb 12.8 oz (79.7 kg)  ?Height: 5\' 3"  (1.6 m)  ? ?Physical Exam ?Vitals and nursing note reviewed. Exam conducted with a chaperone present.  ?Constitutional:   ?   General: She is not in acute distress. ?   Appearance: She is obese.  ?HENT:  ?   Head: Normocephalic and atraumatic.  ?Pulmonary:  ?   Effort: Pulmonary effort is normal. No respiratory distress.  ?Genitourinary: ?   General: Normal vulva.  ?   Exam position: Lithotomy position.  ?   Labia:     ?   Right: No rash, tenderness, lesion or injury.     ?   Left: No rash, tenderness, lesion or injury.   ?   Urethra: No prolapse.  ?   Vagina: No signs of injury. Tenderness (With  reduction of posterior prolapse) and prolapsed vaginal walls (Posterior vaginal prolapse present that extends to the vaginal opening.  Easily reproducible although some tenderness with reduction.) present. No vaginal discharge, erythema, bleeding or lesions.  ?   Cervix: No discharge, friability, lesion, erythema, cervical bleeding or eversion.  ?   Rectum: No anal fissure or external hemorrhoid.  ?Skin: ?   General: Skin is warm and dry.  ?Neurological:  ?   General: No focal deficit present.  ?   Mental Status: She is alert and oriented to person, place, and time. Mental status is at baseline.  ?Psychiatric:     ?   Attention and Perception: Attention and perception normal.     ?   Mood and Affect: Mood is depressed.     ?   Speech: Speech normal.     ?   Behavior: Behavior normal. Behavior is cooperative.     ?   Thought Content: Thought content normal.     ?   Cognition and Memory: Cognition and memory normal.     ?   Judgment: Judgment normal.  ? ? ?Assessment & Plan:  ? ?See Encounters Tab for problem based charting. ? ?Patient discussed with Dr. ? ?

## 2021-06-14 NOTE — Assessment & Plan Note (Signed)
Barbara Simon states that she has been taking Celexa daily since it was prescribed and has been tolerating it well.  She states that she still feels depressed, but has noticed improvements overall.  She denies any suicidal ideation at this time. ? ?Assessment/plan: ?PHQ-9 reviewed.  Previous score was 24 on 05/15/2021 with symptoms occurring nearly all days.  At this time, her score is 14 with all symptoms occurring more than half of the days.  It appears that the medication is working successfully for her but she requires additional titration. ? ?- Increase citalopram to 40 mg daily ?- Follow-up in 6-8 weeks ?

## 2021-06-14 NOTE — Patient Instructions (Signed)
It was nice seeing you today! Thank you for choosing [facility] for your Primary Care.  ?  ?Today we talked about:  ? ?Pap Smear: I will call you with the results ?Vaginal Prolapse: I have placed a referral to OB/GYN to help determine best next steps ?Depression and Anxiety: We have increased Celexa today to 40 mg daily (from 20 mg) ? ?Lets follow up in 6-8 weeks ?

## 2021-06-18 LAB — CYTOLOGY - PAP
Adequacy: ABSENT
Comment: NEGATIVE
Diagnosis: NEGATIVE
Diagnosis: REACTIVE
High risk HPV: NEGATIVE

## 2021-06-20 NOTE — Progress Notes (Signed)
Internal Medicine Clinic Attending  Case discussed with Dr. Basaraba  At the time of the visit.  We reviewed the resident's history and exam and pertinent patient test results.  I agree with the assessment, diagnosis, and plan of care documented in the resident's note.  

## 2021-06-25 ENCOUNTER — Telehealth: Payer: Self-pay | Admitting: Internal Medicine

## 2021-06-25 ENCOUNTER — Ambulatory Visit: Payer: 59 | Admitting: Orthopedic Surgery

## 2021-06-25 NOTE — Assessment & Plan Note (Signed)
Mammogram requested and ordered.  ?

## 2021-06-25 NOTE — Telephone Encounter (Signed)
Attempted to contact Ms. [name] regarding pap smear results demonstrating hyperkeratosis. Will need to repeat pap smear in 6 months. No other abnormalities noted.  ?

## 2021-06-25 NOTE — Addendum Note (Signed)
Addended by: Verdene Lennert on: 06/25/2021 03:35 PM ? ? Modules accepted: Orders ? ?

## 2021-06-27 ENCOUNTER — Encounter (HOSPITAL_COMMUNITY): Payer: Self-pay

## 2021-06-27 ENCOUNTER — Other Ambulatory Visit: Payer: Self-pay

## 2021-06-27 ENCOUNTER — Emergency Department (HOSPITAL_COMMUNITY): Payer: 59

## 2021-06-27 ENCOUNTER — Emergency Department (HOSPITAL_COMMUNITY)
Admission: EM | Admit: 2021-06-27 | Discharge: 2021-06-28 | Disposition: A | Discharge status: Home / Self Care | Payer: 59 | Attending: Emergency Medicine | Admitting: Emergency Medicine

## 2021-06-27 ENCOUNTER — Telehealth: Payer: Self-pay | Admitting: *Deleted

## 2021-06-27 DIAGNOSIS — G935 Compression of brain: Secondary | ICD-10-CM | POA: Insufficient documentation

## 2021-06-27 DIAGNOSIS — Z79899 Other long term (current) drug therapy: Secondary | ICD-10-CM | POA: Insufficient documentation

## 2021-06-27 DIAGNOSIS — R519 Headache, unspecified: Secondary | ICD-10-CM

## 2021-06-27 DIAGNOSIS — F1210 Cannabis abuse, uncomplicated: Secondary | ICD-10-CM | POA: Diagnosis not present

## 2021-06-27 LAB — URINALYSIS, ROUTINE W REFLEX MICROSCOPIC
Bilirubin Urine: NEGATIVE
Glucose, UA: NEGATIVE mg/dL
Hgb urine dipstick: NEGATIVE
Ketones, ur: NEGATIVE mg/dL
Leukocytes,Ua: NEGATIVE
Nitrite: NEGATIVE
Protein, ur: NEGATIVE mg/dL
Specific Gravity, Urine: 1.005 (ref 1.005–1.030)
pH: 6 (ref 5.0–8.0)

## 2021-06-27 LAB — CBC WITH DIFFERENTIAL/PLATELET
Abs Immature Granulocytes: 0.01 10*3/uL (ref 0.00–0.07)
Basophils Absolute: 0 10*3/uL (ref 0.0–0.1)
Basophils Relative: 0 %
Eosinophils Absolute: 0.2 10*3/uL (ref 0.0–0.5)
Eosinophils Relative: 3 %
HCT: 38.4 % (ref 36.0–46.0)
Hemoglobin: 13 g/dL (ref 12.0–15.0)
Immature Granulocytes: 0 %
Lymphocytes Relative: 30 %
Lymphs Abs: 1.9 10*3/uL (ref 0.7–4.0)
MCH: 31.9 pg (ref 26.0–34.0)
MCHC: 33.9 g/dL (ref 30.0–36.0)
MCV: 94.1 fL (ref 80.0–100.0)
Monocytes Absolute: 0.4 10*3/uL (ref 0.1–1.0)
Monocytes Relative: 6 %
Neutro Abs: 4 10*3/uL (ref 1.7–7.7)
Neutrophils Relative %: 61 %
Platelets: 240 10*3/uL (ref 150–400)
RBC: 4.08 MIL/uL (ref 3.87–5.11)
RDW: 14 % (ref 11.5–15.5)
WBC: 6.6 10*3/uL (ref 4.0–10.5)
nRBC: 0 % (ref 0.0–0.2)

## 2021-06-27 LAB — COMPREHENSIVE METABOLIC PANEL
ALT: 12 U/L (ref 0–44)
AST: 18 U/L (ref 15–41)
Albumin: 3.2 g/dL — ABNORMAL LOW (ref 3.5–5.0)
Alkaline Phosphatase: 53 U/L (ref 38–126)
Anion gap: 6 (ref 5–15)
BUN: <5 mg/dL — ABNORMAL LOW (ref 6–20)
CO2: 26 mmol/L (ref 22–32)
Calcium: 8.1 mg/dL — ABNORMAL LOW (ref 8.9–10.3)
Chloride: 109 mmol/L (ref 98–111)
Creatinine, Ser: 0.88 mg/dL (ref 0.44–1.00)
GFR, Estimated: >60 mL/min (ref >60)
Glucose, Bld: 84 mg/dL (ref 70–99)
Potassium: 3.5 mmol/L (ref 3.5–5.1)
Sodium: 141 mmol/L (ref 135–145)
Total Bilirubin: 0.4 mg/dL (ref 0.3–1.2)
Total Protein: 5.7 g/dL — ABNORMAL LOW (ref 6.5–8.1)

## 2021-06-27 LAB — RAPID URINE DRUG SCREEN, HOSP PERFORMED
Amphetamines: NOT DETECTED
Barbiturates: NOT DETECTED
Benzodiazepines: NOT DETECTED
Cocaine: NOT DETECTED
Opiates: NOT DETECTED
Tetrahydrocannabinol: POSITIVE — AB

## 2021-06-27 LAB — PREGNANCY, URINE: Preg Test, Ur: NEGATIVE

## 2021-06-27 LAB — AMMONIA: Ammonia: 29 umol/L (ref 9–35)

## 2021-06-27 NOTE — ED Triage Notes (Signed)
Woke up this morning with a headache, has migraine history of them, no medication for them. She feels slow and sometimes stutors. Wearing sunglasses now and states her pain is a 10 right now. Jerks at times and doesn't know why. ?

## 2021-06-27 NOTE — Telephone Encounter (Signed)
Noted, thank you

## 2021-06-27 NOTE — Telephone Encounter (Signed)
Patient called in stating she choked on cornbread 3 days ago, passed out for a few seconds, BF did heimlich which induced vomiting. Since then patient has had severe h/a, decreased appetite, weakness, "jerking in hands, like I'm trying to have a seizure." This RN noted one episode of prolonged stuttering during conversation; this is not her baseline. Patient is advised to head to [facility] ED as she may need imaging. She will call her BF to take her now. ?

## 2021-06-27 NOTE — ED Triage Notes (Signed)
Patient choked on Monday and states she feels all this started then. ?

## 2021-06-27 NOTE — ED Provider Triage Note (Addendum)
Emergency Medicine Provider Triage Evaluation Note ? ?[place] , a 46 y.o. female  was evaluated in triage.  Pt complains of headache started last night. She reports that she choked on a piece or cornbread on Monday and she passed out and someone had to perform the Heimlich maneuver on her. She reports that she has been feeling off since then and has been having jerking movements and been stuttering. She reports she has been feeling slow and "lost". Denies any nausea, vomiting, chest pain. Reports chronic SOB from her asthma or smoking that is unchanged from her baseline. H/o migraines and bipolar.  ? ?Review of Systems  ?Positive:  ?Negative:  ? ?Physical Exam  ?BP 109/73 (BP Location: Right Arm)   Pulse 61   Temp 98.8 ?F (37.1 ?C) (Oral)   Resp 20   SpO2 99%  ?Gen:   Awake, no distress   ?Resp:  Normal effort  ?MSK:   Moves extremities without difficulty  ?Other:  Normal speech. Answering questions appropriately. A&O x4. Sensation intact. Cranial nerves II-XII. Strength 5/5 in upper and lower extremities. The patient had a shaking episode last approximately 2-3 seconds, but was able to talk through it. Full ROM of neck without pain.  ? ?Medical Decision Making  ?Medically screening exam initiated at 12:59 PM.  Appropriate orders placed.  Amyri Matthew Saras was informed that the remainder of the evaluation will be completed by another provider, this initial triage assessment does not replace that evaluation, and the importance of remaining in the ED until their evaluation is complete. ? ?The patient had a shaking episode while I was in the room, but was able to talk through it. Her arms and legs were not jerking, but her body was. Low suspicion for any true seizure. Will CT Head, UDS, and basic labs.  ?  ?Sherrell Puller, PA-C ?06/27/21 1308 ? ?  ?Sherrell Puller, PA-C ?06/27/21 1310 ? ?

## 2021-06-28 ENCOUNTER — Other Ambulatory Visit: Payer: Self-pay | Admitting: Student

## 2021-06-28 MED ORDER — DROPERIDOL 2.5 MG/ML IJ SOLN
1.2500 mg | Freq: Once | INTRAMUSCULAR | Status: AC
  Administered 2021-06-28: 1.25 mg via INTRAMUSCULAR
  Filled 2021-06-28: qty 2

## 2021-06-28 NOTE — ED Notes (Addendum)
Pt came up to this NT and asked where she was in line to be seen. NT told her that she was our longest wait at the moment and that hopefully she will be seen soon. Pt got infuriated with NT and started screaming and gesturing at staff demanding to speak to someone in charge. Pt was made aware that the charge nurse is preoccupied at the moment and won't be able to get to her at this time. Pt proceeded to yell at NT stating that "you lied to me about my wait time. Barbie is just sitting on her ass doing nothing", NT told pt that this was their first time having a conversation all night and that she was sorry for the miscommunication. Pt proceeded to call  NT a liar and continued screaming. NT tried to calmly talk to pt but pt wasn't being cooperative and unwilling to listen. Pt seen walking out of lobby on the phone. Pt has come back into lobby demanding NT to tell her her full name, NT politely informed pt that does not have to give her that information and pt continues to cause a scene.  ?

## 2021-06-28 NOTE — ED Provider Notes (Signed)
?[facility] ?Provider Note ? ? ?CSN: [id number] ?Arrival date & time: 06/27/21  1153 ? ?  ? ?History ? ?Chief Complaint  ?Patient presents with  ? Migraine  ? ? ?Barbara Simon is a 46 y.o. female. ? ?The history is provided by the patient and medical records.  ?Migraine ?Barbara Simon is a 46 y.o. female who presents to the Emergency Department complaining of headache.  She presents to the emergency department for headache as well as multiple additional symptoms.  She states that she has chronic headaches but symptoms significantly worsened after she had a choking event on Monday where she choked on cornbread and her significant other had to administer the Heimlich maneuver.  She complains of worsening of her chronic headaches.  Headaches are predominantly in the frontal and occipital region but they are throughout her head.  They are worse with coughing.  She reports that she sometimes has difficulty swallowing, particularly warm soup.  This has been ongoing for several months.  She has photophobia since she was young.  She reports several weeks to months of an occasional jerking sensation.  She also reports intermittent difficulty with walking.  She states that she has trouble focusing when trying to walk.  No fevers, vomiting.  No new numbness, weakness.  She also complains of brain fog and having more difficulty thinking and remembering than her baseline. ? ?  ? ?Home Medications ?Prior to Admission medications   ?Medication Sig Start Date End Date Taking? Authorizing Provider  ?albuterol (VENTOLIN HFA) 108 (90 Base) MCG/ACT inhaler Inhale 2 puffs into the lungs every 6 (six) hours as needed for wheezing or shortness of breath. 06/10/21   [name], Iulia, MD  ?Buprenorphine HCl-Naloxone HCl 8-2 MG FILM Place 1 Film under the tongue in the morning and at bedtime. 05/25/21   [name], Iulia, MD  ?citalopram (CELEXA) 40 MG tablet Take 1 tablet (40 mg total) by mouth daily.  06/14/21   [name], Iulia, MD  ?fluticasone (FLOVENT HFA) 44 MCG/ACT inhaler Inhale 1 puff into the lungs in the morning and at bedtime. Rinse your mouth with water after each use. 01/30/21   [name], Iulia, MD  ?gabapentin (NEURONTIN) 300 MG capsule Take 3 capsules (900 mg total) by mouth 3 (three) times daily. 06/14/21   [name], Iulia, MD  ?methylPREDNISolone (MEDROL) 4 MG tablet Take 1 tablet (4 mg total) by mouth 2 (two) times daily. 06/10/21   Vickki HearingHarrison, Stanley E, MD  ?ondansetron (ZOFRAN) 4 MG tablet Take 1 tablet (4 mg total) by mouth every 8 (eight) hours as needed for nausea or vomiting. 05/15/21   [name], Iulia, MD  ?pantoprazole (PROTONIX) 40 MG tablet Take 1 tablet (40 mg total) by mouth daily. 05/02/21   [name], Iulia, MD  ?pyridOXINE (VITAMIN B-6) 100 MG tablet 100 mg bid 06/10/21   Vickki HearingHarrison, Stanley E, MD  ?   ? ?Allergies    ?Toradol [ketorolac tromethamine] and Adhesive [tape]   ? ?Review of Systems   ?Review of Systems  ?All other systems reviewed and are negative. ? ?Physical Exam ?Updated Vital Signs ?BP 120/66   Pulse 60   Temp 98 ?F (36.7 ?C)   Resp 18   SpO2 99%  ?Physical Exam ?Vitals and nursing note reviewed.  ?Constitutional:   ?   Appearance: She is well-developed.  ?HENT:  ?   Head: Normocephalic and atraumatic.  ?Cardiovascular:  ?   Rate and Rhythm: Normal rate and regular rhythm.  ?   Heart  sounds: No murmur heard. ?Pulmonary:  ?   Effort: Pulmonary effort is normal. No respiratory distress.  ?   Breath sounds: Normal breath sounds.  ?Abdominal:  ?   Palpations: Abdomen is soft.  ?   Tenderness: There is no abdominal tenderness. There is no guarding or rebound.  ?Musculoskeletal:     ?   General: No tenderness.  ?Skin: ?   General: Skin is warm and dry.  ?Neurological:  ?   Mental Status: She is alert and oriented to person, place, and time.  ?   Comments: No asymmetry of facial movements.  EOMI.  Visual fields grossly intact.  5 out of 5 strength in all 4 extremities with  sensation to light touch intact in all 4 extremities  ?Psychiatric:     ?   Behavior: Behavior normal.  ? ? ?ED Results / Procedures / Treatments   ?Labs ?(all labs ordered are listed, but only abnormal results are displayed) ?Labs Reviewed  ?COMPREHENSIVE METABOLIC PANEL - Abnormal; Notable for the following components:  ?    Result Value  ? BUN <5 (*)   ? Calcium 8.1 (*)   ? Total Protein 5.7 (*)   ? Albumin 3.2 (*)   ? All other components within normal limits  ?RAPID URINE DRUG SCREEN, HOSP PERFORMED - Abnormal; Notable for the following components:  ? Tetrahydrocannabinol POSITIVE (*)   ? All other components within normal limits  ?URINALYSIS, ROUTINE W REFLEX MICROSCOPIC - Abnormal; Notable for the following components:  ? Color, Urine STRAW (*)   ? All other components within normal limits  ?CBC WITH DIFFERENTIAL/PLATELET  ?PREGNANCY, URINE  ?AMMONIA  ? ? ?EKG ?None ? ?Radiology ?CT Head Wo Contrast ? ?Result Date: 06/27/2021 ?CLINICAL DATA:  Provided history: Transient ischemic attack. Additional history provided: Headache/migraine. EXAM: CT HEAD WITHOUT CONTRAST TECHNIQUE: Contiguous axial images were obtained from the base of the skull through the vertex without intravenous contrast. RADIATION DOSE REDUCTION: This exam was performed according to the departmental dose-optimization program which includes automated exposure control, adjustment of the mA and/or kV according to patient size and/or use of iterative reconstruction technique. COMPARISON:  Report from head CT 11/26/2012 (images unavailable). FINDINGS: Brain: Cerebral volume is normal. Cerebellar tonsillar ectopia. The cerebellar tonsils are incompletely included in the field of view, but appear to extend at least 8 mm below the level of foramen magnum. Associated crowding at the level of the foramen magnum with partial effacement of the CSF spaces. Partially empty sella turcica. There is no acute intracranial hemorrhage. No demarcated cortical  infarct. No extra-axial fluid collection. No evidence of an intracranial mass. No midline shift or hydrocephalus. Vascular: No hyperdense vessel.  Atherosclerotic calcifications. Skull: Normal. Negative for fracture or focal lesion. Sinuses/Orbits: Visualized orbits show no acute finding. Partially imaged large mucous retention cyst versus polyp within the left maxillary sinus, measuring at least 2.7 cm. IMPRESSION: No evidence of acute intracranial abnormality. Cerebellar tonsillar ectopia. The cerebellar tonsils are incompletely included in the field of view, but appear to extend at least 8 mm below the level of foramen magnum. Findings may reflect a Chiari I malformation. Alternatively, cerebellar tonsillar ectopia can be seen in the setting of idiopathic intracranial hypertension (pseudotumor cerebri) or intracranial hypotension. A brain MRI should be considered for further evaluation. Partially empty sella turcica. While this finding can reflect incidental anatomic variation, it can also be associated with idiopathic intracranial hypertension (pseudotumor cerebri). Partially imaged large mucous retention cyst versus polyp within the  left maxillary sinus. Electronically Signed   By: Jackey Loge D.O.   On: 06/27/2021 13:56  ? ?MR BRAIN WO CONTRAST ? ?Result Date: 06/27/2021 ?CLINICAL DATA:  Possible Chiari malformation versus pseudotumor. Headache. EXAM: MRI HEAD WITHOUT CONTRAST TECHNIQUE: Multiplanar, multiecho pulse sequences of the brain and surrounding structures were obtained without intravenous contrast. COMPARISON:  CT head 06/27/2021 FINDINGS: Brain: Cerebellar tonsils extend 11 mm below the foramen magnum. No impaction of the tonsils. No syrinx in the proximal cervical cord. Findings most compatible with Chiari malformation. No other findings of intracranial hypertension. Ventricle size normal. Negative for acute or chronic infarct. Normal white matter. Negative for hemorrhage or mass. Pituitary normal.  Vascular: Normal arterial flow voids. Skull and upper cervical spine: No focal skeletal lesion. Sinuses/Orbits: Complete opacification left maxillary sinus. Remaining sinuses clear. Mastoid clear. Negative orbit Othe

## 2021-06-28 NOTE — ED Notes (Signed)
Patient verbalizes understanding of d/c instructions. Opportunities for questions and answers were provided. Pt d/c from ED and ambulated to lobby.  

## 2021-07-01 ENCOUNTER — Other Ambulatory Visit (HOSPITAL_COMMUNITY): Payer: Self-pay | Admitting: Internal Medicine

## 2021-07-01 ENCOUNTER — Telehealth: Payer: Self-pay

## 2021-07-01 ENCOUNTER — Encounter: Payer: Self-pay | Admitting: Physician Assistant

## 2021-07-01 NOTE — Telephone Encounter (Signed)
Pt would like to informed Dr. Huel Cote she went to Ed on  06/27/2021. They referral her to neurology office that will not accept her insurance. Requesting a referral to different location that will accept her insurance. Pt has an appt on 07/04/2021 with Dr. August Saucer. ?

## 2021-07-02 ENCOUNTER — Telehealth: Payer: Self-pay | Admitting: *Deleted

## 2021-07-02 NOTE — Telephone Encounter (Signed)
Call from pt's fiance. He stated pt's condition is getting worse; she has been "passing out". He said it seems more like a seizure.  And when she passes out, she stops breathing and he has to pound on her chest. Pt was seen in the ED on 4/13. And an Neuro referral was ordered but they do not accept her insurance. After reading the notes and talking with Chilon B, Neurosurgery referral was placed; pt has already been seen by neuro. Chilon stated with [employer], there is no neurosurg place to send pts with this type of insurance. Appt schedule with Dr Sherrie George tomorrow 4/19 @ 22 Am (only available appt). ?

## 2021-07-03 ENCOUNTER — Encounter: Payer: Self-pay | Admitting: Student

## 2021-07-03 ENCOUNTER — Observation Stay (HOSPITAL_COMMUNITY)
Admission: AD | Admit: 2021-07-03 | Discharge: 2021-07-05 | Disposition: A | Discharge status: Home / Self Care | Payer: 59 | Source: Ambulatory Visit | Attending: Student in an Organized Health Care Education/Training Program | Admitting: Student in an Organized Health Care Education/Training Program

## 2021-07-03 ENCOUNTER — Observation Stay (HOSPITAL_COMMUNITY): Payer: 59

## 2021-07-03 ENCOUNTER — Ambulatory Visit (INDEPENDENT_AMBULATORY_CARE_PROVIDER_SITE_OTHER): Payer: 59 | Admitting: Student

## 2021-07-03 DIAGNOSIS — G935 Compression of brain: Secondary | ICD-10-CM | POA: Diagnosis present

## 2021-07-03 DIAGNOSIS — R55 Syncope and collapse: Secondary | ICD-10-CM | POA: Diagnosis present

## 2021-07-03 DIAGNOSIS — R519 Headache, unspecified: Secondary | ICD-10-CM | POA: Diagnosis present

## 2021-07-03 DIAGNOSIS — F3181 Bipolar II disorder: Secondary | ICD-10-CM

## 2021-07-03 DIAGNOSIS — R569 Unspecified convulsions: Principal | ICD-10-CM | POA: Insufficient documentation

## 2021-07-03 DIAGNOSIS — F439 Reaction to severe stress, unspecified: Secondary | ICD-10-CM

## 2021-07-03 DIAGNOSIS — Z79899 Other long term (current) drug therapy: Secondary | ICD-10-CM | POA: Insufficient documentation

## 2021-07-03 DIAGNOSIS — F17210 Nicotine dependence, cigarettes, uncomplicated: Secondary | ICD-10-CM | POA: Insufficient documentation

## 2021-07-03 DIAGNOSIS — I959 Hypotension, unspecified: Secondary | ICD-10-CM | POA: Insufficient documentation

## 2021-07-03 DIAGNOSIS — Z7982 Long term (current) use of aspirin: Secondary | ICD-10-CM | POA: Diagnosis not present

## 2021-07-03 DIAGNOSIS — J45909 Unspecified asthma, uncomplicated: Secondary | ICD-10-CM | POA: Insufficient documentation

## 2021-07-03 DIAGNOSIS — F445 Conversion disorder with seizures or convulsions: Secondary | ICD-10-CM

## 2021-07-03 DIAGNOSIS — F329 Major depressive disorder, single episode, unspecified: Secondary | ICD-10-CM | POA: Diagnosis present

## 2021-07-03 LAB — COMPREHENSIVE METABOLIC PANEL
ALT: 11 U/L (ref 0–44)
AST: 16 U/L (ref 15–41)
Albumin: 3.3 g/dL — ABNORMAL LOW (ref 3.5–5.0)
Alkaline Phosphatase: 51 U/L (ref 38–126)
Anion gap: 8 (ref 5–15)
BUN: 5 mg/dL — ABNORMAL LOW (ref 6–20)
CO2: 27 mmol/L (ref 22–32)
Calcium: 8.4 mg/dL — ABNORMAL LOW (ref 8.9–10.3)
Chloride: 106 mmol/L (ref 98–111)
Creatinine, Ser: 0.94 mg/dL (ref 0.44–1.00)
GFR, Estimated: >60 mL/min (ref >60)
Glucose, Bld: 79 mg/dL (ref 70–99)
Potassium: 3.8 mmol/L (ref 3.5–5.1)
Sodium: 141 mmol/L (ref 135–145)
Total Bilirubin: 0.4 mg/dL (ref 0.3–1.2)
Total Protein: 5.6 g/dL — ABNORMAL LOW (ref 6.5–8.1)

## 2021-07-03 LAB — CBC
HCT: 38.6 % (ref 36.0–46.0)
Hemoglobin: 13.3 g/dL (ref 12.0–15.0)
MCH: 32.2 pg (ref 26.0–34.0)
MCHC: 34.5 g/dL (ref 30.0–36.0)
MCV: 93.5 fL (ref 80.0–100.0)
Platelets: 248 10*3/uL (ref 150–400)
RBC: 4.13 MIL/uL (ref 3.87–5.11)
RDW: 13.7 % (ref 11.5–15.5)
WBC: 9.1 10*3/uL (ref 4.0–10.5)
nRBC: 0 % (ref 0.0–0.2)

## 2021-07-03 LAB — VITAMIN B12: Vitamin B-12: 314 pg/mL (ref 180–914)

## 2021-07-03 LAB — TSH: TSH: 0.646 u[IU]/mL (ref 0.350–4.500)

## 2021-07-03 LAB — PHOSPHORUS: Phosphorus: 3.6 mg/dL (ref 2.5–4.6)

## 2021-07-03 LAB — MAGNESIUM: Magnesium: 1.9 mg/dL (ref 1.7–2.4)

## 2021-07-03 MED ORDER — ALBUTEROL SULFATE (2.5 MG/3ML) 0.083% IN NEBU: 2.5000 mg | INHALATION_SOLUTION | Freq: Four times a day (QID) | RESPIRATORY_TRACT | Status: DC | PRN

## 2021-07-03 MED ORDER — PANTOPRAZOLE SODIUM 40 MG PO TBEC
40.0000 mg | DELAYED_RELEASE_TABLET | Freq: Every day | ORAL | Status: DC
  Administered 2021-07-04 – 2021-07-05 (×2): 40 mg via ORAL
  Filled 2021-07-03 (×2): qty 1

## 2021-07-03 MED ORDER — ACETAMINOPHEN 500 MG PO TABS
1000.0000 mg | ORAL_TABLET | Freq: Three times a day (TID) | ORAL | Status: DC
  Administered 2021-07-03 – 2021-07-05 (×5): 1000 mg via ORAL
  Filled 2021-07-03 (×5): qty 2

## 2021-07-03 MED ORDER — BUPRENORPHINE HCL-NALOXONE HCL 8-2 MG SL SUBL
1.0000 | SUBLINGUAL_TABLET | Freq: Two times a day (BID) | SUBLINGUAL | Status: DC
  Administered 2021-07-03 – 2021-07-05 (×4): 1 via SUBLINGUAL
  Filled 2021-07-03 (×4): qty 1

## 2021-07-03 MED ORDER — BUDESONIDE 0.5 MG/2ML IN SUSP
0.5000 mg | Freq: Two times a day (BID) | RESPIRATORY_TRACT | Status: DC
Start: 2021-07-03 — End: 2021-07-05
  Administered 2021-07-04 – 2021-07-05 (×3): 0.5 mg via RESPIRATORY_TRACT
  Filled 2021-07-03 (×3): qty 2

## 2021-07-03 MED ORDER — LACTATED RINGERS IV BOLUS
1000.0000 mL | Freq: Once | INTRAVENOUS | Status: AC
  Administered 2021-07-03: 1000 mL via INTRAVENOUS

## 2021-07-03 MED ORDER — ALBUTEROL SULFATE HFA 108 (90 BASE) MCG/ACT IN AERS: 2.0000 | INHALATION_SPRAY | Freq: Four times a day (QID) | RESPIRATORY_TRACT | Status: DC | PRN

## 2021-07-03 MED ORDER — GABAPENTIN 300 MG PO CAPS
900.0000 mg | ORAL_CAPSULE | Freq: Three times a day (TID) | ORAL | Status: DC
Start: 2021-07-03 — End: 2021-07-05
  Administered 2021-07-03 – 2021-07-05 (×5): 900 mg via ORAL
  Filled 2021-07-03 (×5): qty 3

## 2021-07-03 MED ORDER — CITALOPRAM HYDROBROMIDE 40 MG PO TABS
40.0000 mg | ORAL_TABLET | Freq: Every day | ORAL | Status: DC
  Administered 2021-07-04 – 2021-07-05 (×2): 40 mg via ORAL
  Filled 2021-07-03 (×2): qty 1

## 2021-07-03 NOTE — Progress Notes (Signed)
Teaching services contacted to do admission. Residents will come to assess after oncoming hand off.  ?

## 2021-07-03 NOTE — Progress Notes (Addendum)
? ?CC: Headache.  Chiari malformation 1 ? ?HPI: ? ?[name] is a 46 y.o. with past medical history of OUD on Suboxone, depression, bipolar disorder, who presents to the clinic to follow-up on her Chiari malformation 1. ? ?Please see problem based charting for detail ? ?Past Medical History:  ?Diagnosis Date  ? Abnormal Pap smear   ? LSIL  ? Anxiety   ? Bipolar 1 disorder (HCC)   ? Depression   ? Hepatitis C   ? two years ago was dx  ? Ovarian cyst   ? Thyrotoxicosis   ? ?Review of Systems:  per HPI ? ?Physical Exam: ? ?Vitals:  ? 07/03/21 0829  ?BP: 106/60  ?Pulse: (!) 59  ?Temp: 98.7 ?F (37.1 ?C)  ?TempSrc: Oral  ?SpO2: 98%  ?Weight: 174 lb 6.4 oz (79.1 kg)  ? ?Physical Exam ?Constitutional:   ?   General: She is not in acute distress. ?HENT:  ?   Head: Normocephalic.  ?Eyes:  ?   General:     ?   Right eye: No discharge.     ?   Left eye: No discharge.  ?   Conjunctiva/sclera: Conjunctivae normal.  ?   Pupils: Pupils are equal, round, and reactive to light.  ?Cardiovascular:  ?   Rate and Rhythm: Normal rate and regular rhythm.  ?   Heart sounds: Normal heart sounds.  ?Pulmonary:  ?   Effort: Pulmonary effort is normal. No respiratory distress.  ?   Breath sounds: Normal breath sounds.  ?Musculoskeletal:     ?   General: Normal range of motion.  ?Skin: ?   General: Skin is warm.  ?Neurological:  ?   Mental Status: She is alert and oriented to person, place, and time.  ?   Comments: Patient had 3 nonepileptic seizure episodes during examination.  Patient had generalized shaking episode but retained consciousness the whole time.  No postictal confusion.  Could not perform neuro exam because of these episodes.  ?Psychiatric:     ?   Thought Content: Thought content normal.  ?  ? ?Assessment & Plan:  ? ?See Encounters Tab for problem based charting. ? ?Chiari malformation type I (HCC) ?Patient is here for her recent ED follow-up.  She endorses worsening chronic headache with photophobia, difficulty walking  and brain fog for the last few months.  MRI in the ED showed Chiari malformation without intracranial hypertension.  No prior MRI to compare.  ? ?Also endorses an episode of syncope yesterday while she was in the car.  Said that she coughed a few times and then passed.  No prodrome symptoms.  She regained consciousness in a few seconds.  No postictal confusion.  Said that she had many syncope that was triggered by emotional distress. ? ?Endorses problems swallowing frequently. ? ?Her sister also concerned about her recent worsening of the symptoms.  She states that patient has bipolar disorder but was never like this.  She states that the patient does not drink alcohol, smoking or use any substance. ? ?Patient had 3 nonepileptic seizure episodes during examination.  Observed generalized shaking episode but retain consciousness the whole time.  No postictal confusion.  Said that her back was hurting but denies pain next minute.  Patient was able to use the restroom normally after this episode.  I could not perform a neuro exam due to these episodes. ? ?The symptoms could be secondary to her Chiari malformation 1 but I believe there is a large  component of behavioral issue.  She cannot see a neurosurgeon outpatient due to insurance issue.  I believe an urgent evaluation by neurosurgery is warranted. ? ?We will direct admit her to the hospital for neurosurgery consult as well as psychiatry consult.  Hopefully will have a bed available today.  Patient states that if it takes a long time for her bed to be available, she will go home and wait at home.  She will have his sister, fianc? and father-in-law with her 24 hours.  She is not willing to wait in the ED.  ? ?Patient discussed with Dr.  Sol Blazing   ?

## 2021-07-03 NOTE — Addendum Note (Signed)
Addended by: Dolan Amen C on: 07/03/2021 10:55 AM ? ? Modules accepted: Orders ? ?

## 2021-07-03 NOTE — Hospital Course (Signed)
States she has had multiple jerking episodes that started about 6-4 weeks ago. Recently, the jerking involved her whole body but was mostly her legs before. Reports body soreness from these episodes ? ?Reports a syncope episode yesterday ? ?Endorses occasional difficulty swallowing, memory problems, headaches ?Headache starts at back of her head and has had associated photophobia, nausea and vomiting ? ?Reports some problems with her periphery vision. Also endorse  ? ?Fh: Mother, brother and sister had diabetes. Aunt had breast cancer.  ? ?SH: Lives in [place] with husband. Smokes 1 ppd since she was 14, stopped for about 5 years before starting again last year.  ?Substance use hx: anything she could get her hands on.  ? ?

## 2021-07-03 NOTE — Assessment & Plan Note (Addendum)
Patient is here for her recent ED follow-up.  She endorses worsening chronic headache with photophobia, difficulty walking and brain fog for the last few months.  MRI in the ED showed Chiari malformation without intracranial hypertension.  No prior MRI to compare.  ? ?Also endorses an episode of syncope yesterday while she was in the car.  Said that she coughed a few times and then passed.  No prodrome symptoms.  She regained consciousness in a few seconds.  No postictal confusion.  Said that she had many syncope that was triggered by emotional distress. ? ?Endorses problems swallowing frequently. ? ?Her sister also concerned about her recent worsening of the symptoms.  She states that patient has bipolar disorder but was never like this.  She states that the patient does not drink alcohol, smoking or use any substance. ? ?Patient had 3 nonepileptic seizure episodes during examination.  Observed generalized shaking episode but retain consciousness the whole time.  No postictal confusion.  Said that her back was hurting but denies pain next minute.  Patient was able to use the restroom normally after this episode.  I could not perform a neuro exam due to these episodes. ? ?The symptoms could be secondary to her Chiari malformation 1 but I believe there is a large component of behavioral issue.  She cannot see a neurosurgeon outpatient due to insurance issue.  I believe an urgent evaluation by neurosurgery is warranted. ? ?We will direct admit her to the hospital for neurosurgery consult as well as psychiatry consult.  Hopefully will have a bed available today.  Patient states that if it takes a long time for her bed to be available, she will go home and wait at home.  She will have his sister, fianc? and father-in-law with her 24 hours.  She is not willing to wait in the ED. ?

## 2021-07-03 NOTE — H&P (Addendum)
? ? ? ?Date: 07/03/2021     ?     ?     ?Patient Name:  Barbara Simon MRN: [id number]  ?DOB: 11-28-1975 Age / Sex: 46 y.o., female   ?PCP: [name], Iulia, MD    ?     ?Medical Service: Internal Medicine Teaching Service    ?     ?Attending Physician: Dr. Oswaldo DoneVincent, Marquita Palmsuncan Thomas, *    ?First Contact: Dr. Burnice LoganGawaluck Pager: [phone]  ?Second Contact: Dr. Evie LacksKatsadouros Pager: [phone]  ?     ?After Hours (After 5p/  First Contact Pager: [phone]  ?weekends / holidays): Second Contact Pager: [phone]  ? ?Chief Complaint: HA, brain fog, syncope, and shaking episodes ? ?History of Present Illness: Barbara Simon is a 46 y.o. female with a PMHx of a Chiari I malformation, chronic hepatitis C, carpal tunnel syndrome, anxiety/depression, asthma, and bipolar I disorder who presents here today as a direct admission for CC of HA, brain fog, syncope, and shaking episodes.  ? ?The patient states that she has had multiple jerking episodes that started about 1-2 months ago.  When the episodes first started, they would involve 1 or both of her legs rather than her whole body.  However, she states that she had an episode where she choked on some cornbread, then afterwards her jerking episodes have become more generalized.  She endorses body soreness from the episodes.  Denies urinary or bowel incontinence after the episodes.  No tongue biting episodes.  Other complaints include intermittent dysphagia to both solids and liquids.  Also endorsed an episode of syncope yesterday.  Also endorses history of chronic headaches which have worsened in the past few days.  The headaches start in the back of her head and radiate to the front.  The headaches are associated with photophobia, nausea, and vomiting.  States that she has difficulty concentrating and sometimes has word-finding difficulty.  No other complaints or concerns today. ? ?Meds:  ?Current Meds  ?Medication Sig  ? acetaminophen (TYLENOL) 500 MG tablet Take 500 mg by mouth every 6  (six) hours as needed for moderate pain.  ? albuterol (VENTOLIN HFA) 108 (90 Base) MCG/ACT inhaler Inhale 2 puffs into the lungs every 6 (six) hours as needed for wheezing or shortness of breath.  ? Aspirin-Salicylamide-Caffeine (BC HEADACHE PO) Take 1 packet by mouth daily.  ? Buprenorphine HCl-Naloxone HCl 8-2 MG FILM Place 1 Film under the tongue in the morning and at bedtime.  ? citalopram (CELEXA) 40 MG tablet Take 1 tablet (40 mg total) by mouth daily.  ? fluticasone (FLOVENT HFA) 44 MCG/ACT inhaler Inhale 1 puff into the lungs in the morning and at bedtime. Rinse your mouth with water after each use.  ? gabapentin (NEURONTIN) 300 MG capsule Take 3 capsules (900 mg total) by mouth 3 (three) times daily.  ? pantoprazole (PROTONIX) 40 MG tablet Take 1 tablet (40 mg total) by mouth daily.  ? ?Allergies: ?Allergies as of 07/03/2021 - Review Complete 07/03/2021  ?Allergen Reaction Noted  ? Toradol [ketorolac tromethamine] Hives and Itching 10/22/2011  ? Adhesive [tape] Itching and Rash 10/22/2011  ? ?Past Medical History:  ?Diagnosis Date  ? Abnormal Pap smear   ? LSIL  ? Anxiety   ? Bipolar 1 disorder (HCC)   ? Depression   ? Hepatitis C   ? two years ago was dx  ? Ovarian cyst   ? Thyrotoxicosis   ? ?Family History:  ?Family History  ?Problem Relation Age of Onset  ?  Cancer Maternal Grandmother   ? Cancer Maternal Aunt   ?Mother, brother and sister had diabetes. Aunt had breast cancer.  ? ?Social History: The patient lives in [place] with her husband. Smokes 1 ppd since she was 14, stopped for about 5 years before starting again last year.  Denies alcohol use. Smokes marijuana occasionally.  Other substance use hx: anything she could get her hands on.  ? ?Review of Systems: ?A complete ROS was negative except as per HPI.  ? ?Physical Exam: ?Blood pressure (!) 94/58, pulse (!) 53, temperature 97.7 ?F (36.5 ?C), temperature source Oral, resp. rate 16, SpO2 98 %. ?General: NAD, nl appearance ?HE: Normocephalic,  atraumatic, EOMI, Conjunctivae normal ?ENT: No congestion, no rhinorrhea, no exudate or erythema  ?Cardiovascular: Normal rate, regular rhythm. No murmurs, rubs, or gallops ?Pulmonary: Effort normal, breath sounds normal. No wheezes, rales, or rhonchi ?Abdominal: soft, nontender, bowel sounds present ?Musculoskeletal: no swelling, deformity, injury or tenderness in extremities. Tinel test positive, right wrist. ?Skin: Warm, dry, no bruising, erythema, or rash ?Psychiatric/Behavioral: Anxious ?Neurologic exam: Patient had multiple nonepileptic shaking episodes during history and exam, no postictal confusion ?Mental status: A&Ox3 ?Cranial Nerves: ?            II: PERRL ?            III, IV, VI: Extra-occular motions intact bilaterally ?            V, VII: Face symmetric, sensation intact in all 3 divisions   ?            IX, X: Palate rises symmetrically ?            XI: Shoulder shrug normal bilaterally   ?            XII: Tongue midline    ?Motor: Strength 5/5 on all upper and lower extremities, bulk muscle and tone are normal ?Sensory: Decreased sensation of right forearm area, otherwise intact throughout ? ?MRI brain without contrast, 06/27/2021: ?IMPRESSION: ?Chiari malformation. Cerebellar tonsils 11 mm below the foramen magnum. No tonsillar impaction or syrinx. No other signs of intracranial hypertension. ? ? ?Assessment & Plan by Problem: ?Principal Problem: ?  Seizure-like activity (HCC) ?Active Problems: ?  Syncope ? ?#Seizure-like activity ?#Bipolar 1 disorder ?#Anxiety/Depression ?Endorses multiple symptoms, including localized shaking episodes since about 1-2 months ago, and have now become both generalized and localized. The episodes are less likely consistent with a true seizure given lack of postictal confusion, urinary/bowel incontinence, tongue biting. Pt also awake during episodes. Differential diagnosis includes PNES vs toxic-metabolic causes. ?-Continue citalopram 40 mg daily ?-EEG pending ?-UDS  pending ?-TSH, B12, Mag, Phosphorus pending ? ?#Chiari I malformation ?#Headaches, chronic ?The patient presents here as a direct admit from our clinic, presented for ED follow-up after brain imaging found a Chiari I malformation.  She also endorses occipital headaches that radiate to the front of the head.  They are also associated with photophobia, nausea, and vomiting.  Her Chiari I malformation could be contributing to some of her symptoms, differential diagnosis also includes migraines.  We have consulted neurosurgery for further recommendations. They recommend C-spine imaging for complete work-up of her Chiari malformation and will evaluate the patient in the AM. ?-Neurosurgery is on board, appreciate their recommendations ?-MRI cervical spine pending ?-Tylenol 1000 mg TID; consider IV metoclopramide 10 mg followed by IV benadryl 25 mg if not responsive to tylenol, other options include NSAIDs and triptans ? ?#Syncope ?The patient endorses a recent episode when  she was in her car and coughed a few times and passed out. Denies prodromal symptoms. States that she regained consciousness within a few seconds, denies postictal confusion.  Per clinic note, she has had many syncopal episodes triggered by emotional distress. Orthostatics negative. Differential diagnosis includes reflex syncope vs cardiac syncope. ?-EKG pending ? ?#Hypotension ?Soft BP's on arrival, most recently @ 94/58.  ?-1L LR bolus ? ?#Asthma ?-Continue home Albuterol ?-Continue home Pulmicort ? ?#H/o polysubstance use ?#Chronic hepatitis C ?Patient endorses prior use of multiple substances, currently on Suboxone treatment. Received tx for hepatitis C in 2015.  ?-Continue Suboxone 1 tablet twice daily ? ?Dispo: Admit patient to Observation with expected length of stay less than 2 midnights. ? ?Signed: ?Andrey Campanile, MD ?07/03/2021, 9:17 PM  ?Pager: [phone]  ?After 5pm on weekdays and 1pm on weekends: On Call pager: [phone] ? ?

## 2021-07-04 ENCOUNTER — Encounter: Payer: 59 | Admitting: Internal Medicine

## 2021-07-04 ENCOUNTER — Observation Stay (HOSPITAL_COMMUNITY): Payer: 59

## 2021-07-04 DIAGNOSIS — R519 Headache, unspecified: Secondary | ICD-10-CM | POA: Diagnosis present

## 2021-07-04 DIAGNOSIS — R569 Unspecified convulsions: Secondary | ICD-10-CM | POA: Diagnosis not present

## 2021-07-04 LAB — RAPID URINE DRUG SCREEN, HOSP PERFORMED
Amphetamines: NOT DETECTED
Barbiturates: NOT DETECTED
Benzodiazepines: NOT DETECTED
Cocaine: NOT DETECTED
Opiates: NOT DETECTED
Tetrahydrocannabinol: POSITIVE — AB

## 2021-07-04 NOTE — Progress Notes (Signed)
? ? ?HD#0 ?SUBJECTIVE:  ?Patient Summary: Barbara Simon is a 46 y.o. with a pertinent PMH of opioid use disorder, depression, chronic headaches, Chiari I malformation, who presented with shaking episodes with syncope and admitted for seizure-like episodes with syncope.  ? ?Overnight Events: No acute events overnight ? ?  ?Interm History: Patient reporting her main concern today is syncopal episodes and not her headaches.  She reports that she has had worsening syncopes her past week.  She states that she is afraid to drive, feels as though there is a constant fog over her brain and that she is very sleepy today.  Reports daily headaches radiating from back to front of forehead/face. ? ?OBJECTIVE:  ?Vital Signs: ?Vitals:  ? 07/04/21 0734 07/04/21 0741 07/04/21 0745 07/04/21 1157  ?BP: (!) 87/62 (!) 97/55  (!) 91/57  ?Pulse: (!) 53   (!) 52  ?Resp: 17   16  ?Temp: 97.7 ?F (36.5 ?C)   98.5 ?F (36.9 ?C)  ?TempSrc: Oral   Oral  ?SpO2: 97%  100% 97%  ? ?Supplemental O2:  ?SpO2: 97 % ? ?There were no vitals filed for this visit. ? ? ?Intake/Output Summary (Last 24 hours) at 07/04/2021 1359 ?Last data filed at 07/04/2021 1300 ?Gross per 24 hour  ?Intake 387 ml  ?Output --  ?Net 387 ml  ? ?Net IO Since Admission: 387 mL [07/04/21 1359] ? ?Physical Exam: ?General: No acute distress resting comfortably in bed ?Cardiovascular: Regular rate and rhythm no murmurs rubs or gallops, extremities warm and well-perfused distal pulses intact, no lower extremity edema. ?Pulmonary: Lungs clear to auscultation no wheezes rales or rhonchi.  Normal respiratory effort ?Abdominal: Soft nondistended ?MSK: No obvious deformities normal muscle bulk moves all extremities ?Skin: Warm and dry ?Neuro: Alert and oriented, pupils equal round reactive, extraocular movements intact, no nystagmus, facial symmetry, sensation intact in all 3 divisions, shoulder shrug normal, speech fluent, thought process congruent, strength in proximal and distal muscles  of upper and lower extremities 5/5, grip strength 5 5, sensation to gross touch intact, no dysdiadochokinesis, no dysmetria, heel-to-shin testing normal, moves all extremities appropriately.  Nonepileptic shaking episodes witnessed during history and exam, no postictal confusion, able to follow commands and converse appropriately throughout. ?Psych: Mood and affect appropriate ? ? ?Patient Lines/Drains/Airways Status   ? ? Active Line/Drains/Airways   ? ? Name Placement date Placement time Site Days  ? Peripheral IV 07/03/21 22 G 1" Anterior;Left Forearm 07/03/21  2158  Forearm  1  ? ?  ?  ? ?  ? ? ?Pertinent Labs: ? ?  Latest Ref Rng & Units 07/03/2021  ?  9:38 PM 06/27/2021  ?  1:18 PM 09/16/2014  ? 11:30 AM  ?CBC  ?WBC 4.0 - 10.5 K/uL 9.1   6.6   8.8    ?Hemoglobin 12.0 - 15.0 g/dL 76.7   20.9   47.0    ?Hematocrit 36.0 - 46.0 % 38.6   38.4   39.9    ?Platelets 150 - 400 K/uL 248   240   255    ? ? ? ?  Latest Ref Rng & Units 07/03/2021  ?  9:38 PM 06/27/2021  ?  1:18 PM 07/24/2020  ? 10:36 AM  ?CMP  ?Glucose 70 - 99 mg/dL 79   84   80    ?BUN 6 - 20 mg/dL 5   <5   14    ?Creatinine 0.44 - 1.00 mg/dL 9.62   8.36   6.29    ?  Sodium 135 - 145 mmol/L 141   141   141    ?Potassium 3.5 - 5.1 mmol/L 3.8   3.5   4.4    ?Chloride 98 - 111 mmol/L 106   109   103    ?CO2 22 - 32 mmol/L 27   26   24     ?Calcium 8.9 - 10.3 mg/dL 8.4   8.1   9.4    ?Total Protein 6.5 - 8.1 g/dL 5.6   5.7   6.1    ?Total Bilirubin 0.3 - 1.2 mg/dL 0.4   0.4   <4.0<0.2    ?Alkaline Phos 38 - 126 U/L 51   53   57    ?AST 15 - 41 U/L 16   18   22     ?ALT 0 - 44 U/L 11   12   13     ? ? ?No results for input(s): GLUCAP in the last 72 hours.  ? ?Pertinent Imaging: ?MR CERVICAL SPINE WO CONTRAST ? ?Result Date: 07/04/2021 ?CLINICAL DATA:  Headache EXAM: MRI CERVICAL SPINE WITHOUT CONTRAST TECHNIQUE: Multiplanar, multisequence MR imaging of the cervical spine was performed. No intravenous contrast was administered. COMPARISON:  None. FINDINGS: Alignment:  Physiologic. Vertebrae: No fracture, evidence of discitis, or bone lesion. Cord: Normal signal and morphology. Posterior Fossa, vertebral arteries, paraspinal tissues: The cerebellar tonsils extend 8 mm below the foramen magnum. Disc levels: C1-2: Unremarkable. C2-3: Normal disc space and facet joints. There is no spinal canal stenosis. No neural foraminal stenosis. C3-4: Left uncovertebral hypertrophy. There is no spinal canal stenosis. Mild left neural foraminal stenosis. C4-5: Normal disc space and facet joints. There is no spinal canal stenosis. No neural foraminal stenosis. C5-6: Normal disc space and facet joints. There is no spinal canal stenosis. No neural foraminal stenosis. C6-7: Normal disc space and facet joints. There is no spinal canal stenosis. No neural foraminal stenosis. C7-T1: Normal disc space and facet joints. There is no spinal canal stenosis. No neural foraminal stenosis. IMPRESSION: 1. Mild left C3-4 neural foraminal stenosis secondary to uncovertebral hypertrophy. 2. No spinal canal stenosis. 3. Cerebellar tonsils extend 8 mm below the foramen magnum. This is consistent with Chiari I malformation. No cervical hydrosyringomyelia. Electronically Signed   By: Deatra RobinsonKevin  Herman M.D.   On: 07/04/2021 00:52  ? ?EEG adult ? ?Result Date: 07/04/2021 ?Charlsie QuestYadav, Priyanka O, MD     07/04/2021 12:15 PM Patient Name: Barbara CraftsCrystal Holland MRN: [id number] Epilepsy Attending: Charlsie QuestPriyanka O Yadav Referring Physician/Provider: Steffanie RainwaterAmponsah, Prosper M, MD Date: 07/04/2021 Duration: 21.48 mins Patient history: 46 year old female with seizure-like activity.  EEG to evaluate for seizure. Level of alertness: Awake, asleep AEDs during EEG study: Gabapentin Technical aspects: This EEG study was done with scalp electrodes positioned according to the 10-20 International system of electrode placement. Electrical activity was acquired at a sampling rate of 500Hz  and reviewed with a high frequency filter of 70Hz  and a low frequency filter of  1Hz . EEG data were recorded continuously and digitally stored. Description: The posterior dominant rhythm consists of 8-9 Hz activity of moderate voltage (25-35 uV) seen predominantly in posterior head regions, symmetric and reactive to eye opening and eye closing. Sleep was characterized by vertex waves, sleep spindles (12 to 14 Hz), maximal frontocentral region. Hyperventilation and photic stimulation were not performed.   IMPRESSION: This study is within normal limits. No seizures or epileptiform discharges were seen throughout the recording. Priyanka Annabelle Harman Yadav   ? ?ASSESSMENT/PLAN:  ?Assessment: ?Principal Problem: ?  Seizure-like  activity (HCC) ?Active Problems: ?  Major depression ?  Stress at home ?  Chiari malformation type I (HCC) ?  Syncope ?  Headache ? ?Nonepileptic event ?Syncope/loss of consciousness ?Patient reporting transient loss of consciousness of increased frequency over the past week.  She does report these events typically occur during episodes of emotional stress.  Patient does not have history of seizures.  MRI did show Chiari malformation type I, however this is unlikely to be contributing to her new seizure-like episodes.   ?- EEG was obtained, and patient reportedly had some twitching events during this.  EEG did not show any epileptiform activity.  Discussed with neurology as to whether overnight EEG and further neurologic evaluation would be necessary, however no further need for evaluation.   ?- Did not recommend antiepileptic drugs at this time.   ?- She may benefit from mood stabilizing medicines in the future, however these may be able to be started as an outpatient. ? ?Chronic headache ?Chiari malformation type I ?Patient reporting chronic headache for many years.  She states that she has headache 24/7.  Additionally she describes photophobia, nausea, tearing, rhinorrhea, neck spasms creating a mixed picture.  She does take Goody powders every day to help with this headache.  Says  that these do provide some minimal brief improvement.  Has noted that headache has worsened recently.  Imaging of head did reveal Chiari malformation.  This may be contributing but unlikely to be because of acute

## 2021-07-04 NOTE — Progress Notes (Signed)
EEG complete - results pending 

## 2021-07-04 NOTE — Consult Note (Signed)
Reason for Consult:Chiari malformation  ?Referring Physician: Tyson Alias ? ?Barbara Simon is an 46 y.o. female.  ?HPI: who approximately two weeks ago choked on cornbread. Her boyfriend was able to dislodge it with a heimlich and causing her to vomit. She says since that time she has episodes of shaking accompanied by altered and loss of consciousness.She also complains of headaches, nausea, and emesis.This led her to the ED. Long history of illicit drug use, depression, headaches.  ?EEG performed today was normal.  ?Mri brain revealed a Chiari malformation ?Past Medical History:  ?Diagnosis Date  ? Abnormal Pap smear   ? LSIL  ? Anxiety   ? Bipolar 1 disorder (HCC)   ? Depression   ? Hepatitis C   ? two years ago was dx  ? Ovarian cyst   ? Thyrotoxicosis   ? ? ?Past Surgical History:  ?Procedure Laterality Date  ? ABDOMINAL SURGERY    ? APPENDECTOMY    ? TUBAL LIGATION    ? ? ?Family History  ?Problem Relation Age of Onset  ? Cancer Maternal Grandmother   ? Cancer Maternal Aunt   ? ? ?Social History:  reports that she has been smoking cigarettes. She has been smoking an average of 1 pack per day. She has never used smokeless tobacco. She reports that she does not drink alcohol and does not use drugs. ? ?Allergies:  ?Allergies  ?Allergen Reactions  ? Toradol [Ketorolac Tromethamine] Hives and Itching  ? Adhesive [Tape] Itching and Rash  ? ? ?Medications: I have reviewed the patient's current medications. ?Prior to Admission:  ?Medications Prior to Admission  ?Medication Sig Dispense Refill Last Dose  ? acetaminophen (TYLENOL) 500 MG tablet Take 500 mg by mouth every 6 (six) hours as needed for moderate pain.   07/03/2021  ? albuterol (VENTOLIN HFA) 108 (90 Base) MCG/ACT inhaler Inhale 2 puffs into the lungs every 6 (six) hours as needed for wheezing or shortness of breath. 8 g 2 07/03/2021  ? Aspirin-Salicylamide-Caffeine (BC HEADACHE PO) Take 1 packet by mouth daily.   07/03/2021  ? Buprenorphine  HCl-Naloxone HCl 8-2 MG FILM Place 1 Film under the tongue in the morning and at bedtime. 60 each 0 07/03/2021  ? citalopram (CELEXA) 40 MG tablet Take 1 tablet (40 mg total) by mouth daily. 30 tablet 2 07/03/2021  ? fluticasone (FLOVENT HFA) 44 MCG/ACT inhaler Inhale 1 puff into the lungs in the morning and at bedtime. Rinse your mouth with water after each use. 1 each 1 07/03/2021  ? gabapentin (NEURONTIN) 300 MG capsule Take 3 capsules (900 mg total) by mouth 3 (three) times daily. 240 capsule 2 07/03/2021  ? pantoprazole (PROTONIX) 40 MG tablet Take 1 tablet (40 mg total) by mouth daily. 30 tablet 2 07/03/2021  ? methylPREDNISolone (MEDROL) 4 MG tablet Take 1 tablet (4 mg total) by mouth 2 (two) times daily. (Patient not taking: Reported on 07/03/2021) 60 tablet 0 Not Taking  ? ondansetron (ZOFRAN) 4 MG tablet Take 1 tablet (4 mg total) by mouth every 8 (eight) hours as needed for nausea or vomiting. (Patient not taking: Reported on 07/03/2021) 30 tablet 0 Completed Course  ? pyridOXINE (VITAMIN B-6) 100 MG tablet 100 mg bid (Patient not taking: Reported on 07/03/2021) 60 tablet 0 Not Taking  ? ?Scheduled: ? acetaminophen  1,000 mg Oral TID  ? budesonide  0.5 mg Nebulization BID  ? buprenorphine-naloxone  1 tablet Sublingual BID AC & HS  ? citalopram  40 mg Oral  Daily  ? gabapentin  900 mg Oral TID  ? pantoprazole  40 mg Oral Daily  ? ?[id number] ? ?Results for orders placed or performed during the hospital encounter of 07/03/21 (from the past 48 hour(s))  ?Comprehensive metabolic panel     Status: Abnormal  ? Collection Time: 07/03/21  9:38 PM  ?Result Value Ref Range  ? Sodium 141 135 - 145 mmol/L  ? Potassium 3.8 3.5 - 5.1 mmol/L  ? Chloride 106 98 - 111 mmol/L  ? CO2 27 22 - 32 mmol/L  ? Glucose, Bld 79 70 - 99 mg/dL  ?  Comment: Glucose reference range applies only to samples taken after fasting for at least 8 hours.  ? BUN 5 (L) 6 - 20 mg/dL  ? Creatinine, Ser 0.94 0.44 - 1.00 mg/dL  ? Calcium 8.4 (L) 8.9 -  10.3 mg/dL  ? Total Protein 5.6 (L) 6.5 - 8.1 g/dL  ? Albumin 3.3 (L) 3.5 - 5.0 g/dL  ? AST 16 15 - 41 U/L  ? ALT 11 0 - 44 U/L  ? Alkaline Phosphatase 51 38 - 126 U/L  ? Total Bilirubin 0.4 0.3 - 1.2 mg/dL  ? GFR, Estimated >60 >60 mL/min  ?  Comment: (NOTE) ?Calculated using the CKD-EPI Creatinine Equation (2021) ?  ? Anion gap 8 5 - 15  ?  Comment: Performed at [facility], [address] [place], [place] [address]  ?CBC     Status: None  ? Collection Time: 07/03/21  9:38 PM  ?Result Value Ref Range  ? WBC 9.1 4.0 - 10.5 K/uL  ? RBC 4.13 3.87 - 5.11 MIL/uL  ? Hemoglobin 13.3 12.0 - 15.0 g/dL  ? HCT 38.6 36.0 - 46.0 %  ? MCV 93.5 80.0 - 100.0 fL  ? MCH 32.2 26.0 - 34.0 pg  ? MCHC 34.5 30.0 - 36.0 g/dL  ? RDW 13.7 11.5 - 15.5 %  ? Platelets 248 150 - 400 K/uL  ? nRBC 0.0 0.0 - 0.2 %  ?  Comment: Performed at [facility], [address], [place], [place] [address]  ?Vitamin B12     Status: None  ? Collection Time: 07/03/21  9:38 PM  ?Result Value Ref Range  ? Vitamin B-12 314 180 - 914 pg/mL  ?  Comment: (NOTE) ?This assay is not validated for testing neonatal or ?myeloproliferative syndrome specimens for Vitamin B12 levels. ?Performed at [facility], [address] [place], [place] ?[address] ?  ?TSH     Status: None  ? Collection Time: 07/03/21  9:38 PM  ?Result Value Ref Range  ? TSH 0.646 0.350 - 4.500 uIU/mL  ?  Comment: Performed by a 3rd Generation assay with a functional sensitivity of <=0.01 uIU/mL. ?Performed at [facility], [address], [place], [place] [address] ?  ?Magnesium     Status: None  ? Collection Time: 07/03/21  9:38 PM  ?Result Value Ref Range  ? Magnesium 1.9 1.7 - 2.4 mg/dL  ?  Comment: Performed at [facility], [address] [place], [place] [address]  ?Phosphorus     Status: None  ? Collection Time: 07/03/21  9:38 PM  ?Result Value Ref Range  ? Phosphorus 3.6 2.5 - 4.6 mg/dL  ?  Comment: Performed at [facility], [address]  [place], [place] [address]  ?Rapid urine drug screen (hospital performed)     Status: Abnormal  ? Collection  Time: 07/04/21  6:28 AM  ?Result Value Ref Range  ? Opiates NONE DETECTED NONE DETECTED  ? Cocaine NONE DETECTED NONE DETECTED  ? Benzodiazepines NONE DETECTED NONE DETECTED  ? Amphetamines NONE DETECTED NONE DETECTED  ? Tetrahydrocannabinol POSITIVE (A) NONE DETECTED  ? Barbiturates NONE DETECTED NONE DETECTED  ?  Comment: (NOTE) ?DRUG SCREEN FOR MEDICAL PURPOSES ?ONLY.  IF CONFIRMATION IS NEEDED ?FOR ANY PURPOSE, NOTIFY LAB ?WITHIN 5 DAYS. ? ?LOWEST DETECTABLE LIMITS ?FOR URINE DRUG SCREEN ?Drug Class                     Cutoff (ng/mL) ?Amphetamine and metabolites    1000 ?Barbiturate and metabolites    200 ?Benzodiazepine                 200 ?Tricyclics and metabolites     300 ?Opiates and metabolites        300 ?Cocaine and metabolites        300 ?THC                            50 ?Performed at [facility], [address] [place], [place] ?[id number] ?  ? ? ?MR CERVICAL SPINE WO CONTRAST ? ?Result Date: 07/04/2021 ?CLINICAL DATA:  Headache EXAM: MRI CERVICAL SPINE WITHOUT CONTRAST TECHNIQUE: Multiplanar, multisequence MR imaging of the cervical spine was performed. No intravenous contrast was administered. COMPARISON:  None. FINDINGS: Alignment: Physiologic. Vertebrae: No fracture, evidence of discitis, or bone lesion. Cord: Normal signal and morphology. Posterior Fossa, vertebral arteries, paraspinal tissues: The cerebellar tonsils extend 8 mm below the foramen magnum. Disc levels: C1-2: Unremarkable. C2-3: Normal disc space and facet joints. There is no spinal canal stenosis. No neural foraminal stenosis. C3-4: Left uncovertebral hypertrophy. There is no spinal canal stenosis. Mild left neural foraminal stenosis. C4-5: Normal disc space and facet joints. There is no spinal canal stenosis. No neural foraminal stenosis. C5-6: Normal disc space and facet joints. There is no spinal canal stenosis. No neural  foraminal stenosis. C6-7: Normal disc space and facet joints. There is no spinal canal stenosis. No neural foraminal stenosis. C7-T1: Normal disc space and facet joints. There is no spinal canal stenosis. No neural

## 2021-07-04 NOTE — Progress Notes (Signed)
Telemetry called pt HR dropped at 38. Went to pt room, she is sleeping and snoring. Wake her up, denies any discomfort and asymptomatic. HR ranges from 48-55. Page IM resident and called back immediately. No order made. Will keep monitor. ?

## 2021-07-04 NOTE — Progress Notes (Signed)
Patient has soft blood pressures, especially upon first awaking.  [name], MD and team aware that this continues to be an issue but patient does have adequate blood pressures after she's given a moment to wake up ?

## 2021-07-04 NOTE — TOC Initial Note (Signed)
Transition of Care (TOC) - Initial/Assessment Note  ? ? ?Patient Details  ?Name: Barbara Simon ?MRN: [id number] ?Date of Birth: 12-27-1975 ? ?Transition of Care (TOC) CM/SW Contact:    ?Kermit Balo, RN ?Phone Number: ?07/04/2021, 12:28 PM ? ?Clinical Narrative:                 ?Patient is from home with mother in law and father in law. She denies any DME at home.  ?PcP: Dr Armandina Stammer ?Pharmacy: [facility] in [place]--she denies any issues with home medications. ?She denies any transportation issues.  ?TOC following. ? ?Expected Discharge Plan: Home/Self Care ?Barriers to Discharge: Continued Medical Work up ? ? ?Patient Goals and CMS Choice ?  ?  ?  ? ?Expected Discharge Plan and Services ?Expected Discharge Plan: Home/Self Care ?  ?  ?  ?Living arrangements for the past 2 months: Single Family Home ?                ?  ?  ?  ?  ?  ?  ?  ?  ?  ?  ? ?Prior Living Arrangements/Services ?Living arrangements for the past 2 months: Single Family Home ?Lives with:: Relatives ?Patient language and need for interpreter reviewed:: Yes ?Do you feel safe going back to the place where you live?: Yes      ?  ?  ?  ?Criminal Activity/Legal Involvement Pertinent to Current Situation/Hospitalization: No - Comment as needed ? ?Activities of Daily Living ?  ?  ? ?Permission Sought/Granted ?  ?  ?   ?   ?   ?   ? ?Emotional Assessment ?Appearance:: Appears stated age ?Attitude/Demeanor/Rapport: Engaged ?Affect (typically observed): Accepting ?Orientation: : Oriented to Self, Oriented to Place, Oriented to  Time, Oriented to Situation ?  ?Psych Involvement: No (comment) ? ?Admission diagnosis:  Syncope [R55] ?Patient Active Problem List  ? Diagnosis Date Noted  ? Headache 07/04/2021  ? 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  ? Syncope 07/03/2021  ? Seizure-like activity (HCC) 07/03/2021  ? Posterior vaginal wall prolapse 06/14/2021  ? Healthcare maintenance 05/13/2021  ? Neck muscle spasm 01/09/2021  ? Epigastric discomfort with nausea  and vomiting 01/09/2021  ? History of colonic polyps 2019 01/08/2021  ? Asthma 12/24/2020  ? Tobacco use 11/20/2020  ? Allergic rhinitis 11/20/2020  ? Stress at home 09/25/2020  ? Bipolar disorder (HCC) 07/24/2020  ? Peripheral neuropathy 07/24/2020  ? Carpal tunnel syndrome, bilateral 07/24/2020  ? Opioid use disorder 12/20/2015  ? Cervical high risk HPV (human papillomavirus) test positive 03/29/2013  ? History of hepatitis C 02/15/2013  ? Major depression 02/03/2013  ? ?PCP:  Verdene Lennert, MD ?Pharmacy:   ?[phone] - [place], [place] - [address] AT [facility] [address] ?[address] ?[place] [address] ?Phone: [phone] Fax: [phone] ? ? ? ? ?Social Determinants of Health (SDOH) Interventions ?  ? ?Readmission Risk Interventions ?   ? View : No data to display.  ?  ?  ?  ? ? ? ?

## 2021-07-04 NOTE — Addendum Note (Signed)
Addended by: Dickie La on: 07/04/2021 08:51 AM ? ? Modules accepted: Level of Service ? ?

## 2021-07-04 NOTE — Plan of Care (Signed)

## 2021-07-04 NOTE — Procedures (Signed)
Patient Name: Barbara Simon  ?MRN: [id number]  ?Epilepsy Attending: Charlsie Quest  ?Referring Physician/Provider: Steffanie Rainwater, MD ?Date: 07/04/2021 ?Duration: 21.48 mins ? ?Patient history: 46 year old female with seizure-like activity.  EEG to evaluate for seizure. ? ?Level of alertness: Awake, asleep ? ?AEDs during EEG study: Gabapentin ? ?Technical aspects: This EEG study was done with scalp electrodes positioned according to the 10-20 International system of electrode placement. Electrical activity was acquired at a sampling rate of 500Hz  and reviewed with a high frequency filter of 70Hz  and a low frequency filter of 1Hz . EEG data were recorded continuously and digitally stored.  ? ?Description: The posterior dominant rhythm consists of 8-9 Hz activity of moderate voltage (25-35 uV) seen predominantly in posterior head regions, symmetric and reactive to eye opening and eye closing. Sleep was characterized by vertex waves, sleep spindles (12 to 14 Hz), maximal frontocentral region. Hyperventilation and photic stimulation were not performed.    ? ?IMPRESSION: ?This study is within normal limits. No seizures or epileptiform discharges were seen throughout the recording. ? ?  ? ?

## 2021-07-04 NOTE — Progress Notes (Signed)
Internal Medicine Clinic Attending  I saw and evaluated the patient.  I personally confirmed the key portions of the history and exam documented by Dr. Nguyen and I reviewed pertinent patient test results.  The assessment, diagnosis, and plan were formulated together and I agree with the documentation in the resident's note.\  

## 2021-07-05 ENCOUNTER — Other Ambulatory Visit (HOSPITAL_COMMUNITY): Payer: Self-pay

## 2021-07-05 ENCOUNTER — Ambulatory Visit: Payer: 59 | Admitting: Orthopedic Surgery

## 2021-07-05 DIAGNOSIS — R569 Unspecified convulsions: Secondary | ICD-10-CM | POA: Diagnosis not present

## 2021-07-05 LAB — MAGNESIUM: Magnesium: 2 mg/dL (ref 1.7–2.4)

## 2021-07-05 LAB — BASIC METABOLIC PANEL
Anion gap: 4 — ABNORMAL LOW (ref 5–15)
BUN: 5 mg/dL — ABNORMAL LOW (ref 6–20)
CO2: 27 mmol/L (ref 22–32)
Calcium: 8.6 mg/dL — ABNORMAL LOW (ref 8.9–10.3)
Chloride: 109 mmol/L (ref 98–111)
Creatinine, Ser: 0.81 mg/dL (ref 0.44–1.00)
GFR, Estimated: >60 mL/min (ref >60)
Glucose, Bld: 75 mg/dL (ref 70–99)
Potassium: 4 mmol/L (ref 3.5–5.1)
Sodium: 140 mmol/L (ref 135–145)

## 2021-07-05 MED ORDER — LAMOTRIGINE 25 MG PO TABS
ORAL_TABLET | ORAL | 0 refills | Status: DC
  Filled 2021-07-05: qty 42, 28d supply, fill #0

## 2021-07-05 MED ORDER — QUETIAPINE FUMARATE 50 MG PO TABS
50.0000 mg | ORAL_TABLET | Freq: Every day | ORAL | 0 refills | Status: DC
  Filled 2021-07-05: qty 30, 30d supply, fill #0

## 2021-07-05 NOTE — Discharge Summary (Addendum)
? ?Name: Barbara Simon ?MRN: [id number] ?DOB: 1975/08/10 46 y.o. ?PCP: [name], Iulia, MD ? ?Date of Admission: 07/03/2021  5:47 PM ?Date of Discharge: 07/05/2021 ?Attending Physician: Dr. Oswaldo DoneVincent ? ?Discharge Diagnosis: ?Principal Problem: ?  Seizure-like activity (HCC) ?Active Problems: ?  Major depression ?  Bipolar II disorder (HCC) ?  Stress at home ?  Chiari malformation type I (HCC) ?  Syncope ?  Headache ?  ? ?Discharge Medications: ?Allergies as of 07/05/2021   ? ?   Reactions  ? Toradol [ketorolac Tromethamine] Hives, Itching  ? Adhesive [tape] Itching, Rash  ? ?  ? ?  ?Medication List  ?  ? ?STOP taking these medications   ? ?BC HEADACHE PO ?  ?methylPREDNISolone 4 MG tablet ?Commonly known as: Medrol ?  ?ondansetron 4 MG tablet ?Commonly known as: ZOFRAN ?  ?pyridOXINE 100 MG tablet ?Commonly known as: VITAMIN B-6 ?  ? ?  ? ?TAKE these medications   ? ?acetaminophen 500 MG tablet ?Commonly known as: TYLENOL ?Take 500 mg by mouth every 6 (six) hours as needed for moderate pain. ?  ?albuterol 108 (90 Base) MCG/ACT inhaler ?Commonly known as: VENTOLIN HFA ?Inhale 2 puffs into the lungs every 6 (six) hours as needed for wheezing or shortness of breath. ?  ?Buprenorphine HCl-Naloxone HCl 8-2 MG Film ?Place 1 Film under the tongue in the morning and at bedtime. ?  ?citalopram 40 MG tablet ?Commonly known as: CELEXA ?Take 1 tablet (40 mg total) by mouth daily. ?  ?fluticasone 44 MCG/ACT inhaler ?Commonly known as: FLOVENT HFA ?Inhale 1 puff into the lungs in the morning and at bedtime. Rinse your mouth with water after each use. ?  ?gabapentin 300 MG capsule ?Commonly known as: NEURONTIN ?Take 3 capsules (900 mg total) by mouth 3 (three) times daily. ?  ?lamoTRIgine 25 MG tablet ?Commonly known as: LaMICtal ?Take 1 tablet (25 mg total) by mouth daily for 14 days, THEN 2 tablets (50 mg total) daily for 14 days. ?Start taking on: July 05, 2021 ?  ?pantoprazole 40 MG tablet ?Commonly known as: Protonix ?Take 1  tablet (40 mg total) by mouth daily. ?  ?QUEtiapine 50 MG tablet ?Commonly known as: SEROquel ?Take 1 tablet (50 mg total) by mouth at bedtime. ?  ? ?  ? ? ?Disposition and follow-up:   ?[name] was discharged from [facility] in Stable condition.  At the hospital follow up visit please address: ? ?1.  Follow-up: ? a.  Bipolar II Disease:  Started on Lamictal and Seroquel will need to follow-up with PCP and psychiatry as outpatient. ?  ? b.  Chiari malformation-may follow-up with neurosurgery as outpatient. ? ? ?2.  Labs / imaging needed at time of follow-up: none ? ?3.  Pending labs/ test needing follow-up: None ? ?4.  Medication Changes ? Started: Lamictal, Seroquel ? ?Follow-up Appointments: ? Follow-up Information   ? ? Coletta Memosabbell, Kyle, MD Follow up in 3 week(s).   ?Specialty: Neurosurgery ?Why: please call the office number to make an appointment ?Contact information: ?[address] ?[address] ?[place] [address] ?[phone] ? ? ?  ?  ? ? [name], Iulia, MD Follow up in 1 week(s).   ?Specialty: Internal Medicine ?Contact information: ?[address] ?[phone] ?[place] [address] ?[phone] ? ? ?  ?  ? ?  ?  ? ?  ? ? ?Hospital Course by problem list:  ?Patient presented to the hospital with transient loss of consciousness.  Initially EKG showed some sinus bradycardia, however she was otherwise asymptomatic.  Orthostatics were also obtained and negative.  Given patient's history of twitching during these events short duration EEG was obtained.  Several of these nonepileptic events were observed during EEG.  However no evidence of epileptiform discharges.  Case was discussed with neurology who did not feel the need for overnight EEG nor further evaluation.  Did not recommend starting the patient on antiepileptic drugs at this time.  Patient had endorsed that emotional stress seem to be a trigger for her.  Psychiatry was consulted to assist and recommended she start on  Lamictal with Seroquel as well.  She was discharged with these 2 medications and instructions to follow-up with PCP and psychiatry as an outpatient. ? ?Chiari malformation type I. ?Patient presented with headache.  History was mixed picture, however she did not require extensive pain control.  She was managed with only Tylenol and no further complaints of headache throughout her hospitalization.  Case was discussed with neurology and recommended neurosurgery follow-up.  Neurosurgery evaluated the patient during hospitalization but did not feel the need for any intervention at this time and instead recommended she follow-up as an outpatient. ? ?Major depression, anxiety, bipolar ?Patient evaluated by psychiatry during hospitalization.  She was started on Lamictal and Seroquel with instructions to follow-up with PCP and psychiatry as outpatient. ? ?Discharge Subjective: ?Patient states that she feels better this morning she is unsure as to what is changed.  She states she no longer feels as though she is in a fog.  Discussed with her that EEG did not show any signs of epilepsy.  She does endorse significant life stressors, recently out of prison and lost her job with recent death in the family as well and feels as though the stressors have contributed to her nonepileptic events.  She is agreeable to meeting with psychiatry. ? ?Discharge Exam:   ?BP 98/67 (BP Location: Right Arm)   Pulse (!) 53   Temp 98.2 ?F (36.8 ?C) (Oral)   Resp 18   SpO2 97%  ?Constitutional: well-appearing female sitting in bed, in no acute distress ?HENT: normocephalic atraumatic, mucous membranes moist ?Eyes: conjunctiva non-erythematous ?Neck: supple ?Cardiovascular: regular rate and rhythm, no m/r/g ?Pulmonary/Chest: normal work of breathing on room air, lungs clear to auscultation bilaterally ?Abdominal: soft, non-tender, non-distended ?MSK: normal bulk and tone ?Neurological: alert & oriented x 3, 5/5 strength in bilateral upper and  lower extremities, normal gait ?Skin: warm and dry ?Psych: Mood and affect appropriate ? ?Pertinent Labs, Studies, and Procedures:  ? ?  Latest Ref Rng & Units 07/03/2021  ?  9:38 PM 06/27/2021  ?  1:18 PM 09/16/2014  ? 11:30 AM  ?CBC  ?WBC 4.0 - 10.5 K/uL 9.1   6.6   8.8    ?Hemoglobin 12.0 - 15.0 g/dL 54.2   70.6   23.7    ?Hematocrit 36.0 - 46.0 % 38.6   38.4   39.9    ?Platelets 150 - 400 K/uL 248   240   255    ? ? ? ?  Latest Ref Rng & Units 07/05/2021  ?  2:52 AM 07/03/2021  ?  9:38 PM 06/27/2021  ?  1:18 PM  ?CMP  ?Glucose 70 - 99 mg/dL 75   79   84    ?BUN 6 - 20 mg/dL 5   5   <5    ?Creatinine 0.44 - 1.00 mg/dL 6.28   3.15   1.76    ?  Sodium 135 - 145 mmol/L 140   141   141    ?Potassium 3.5 - 5.1 mmol/L 4.0   3.8   3.5    ?Chloride 98 - 111 mmol/L 109   106   109    ?CO2 22 - 32 mmol/L 27   27   26     ?Calcium 8.9 - 10.3 mg/dL 8.6   8.4   8.1    ?Total Protein 6.5 - 8.1 g/dL  5.6   5.7    ?Total Bilirubin 0.3 - 1.2 mg/dL  0.4   0.4    ?Alkaline Phos 38 - 126 U/L  51   53    ?AST 15 - 41 U/L  16   18    ?ALT 0 - 44 U/L  11   12    ? ? ?MR CERVICAL SPINE WO CONTRAST ? ?Result Date: 07/04/2021 ?CLINICAL DATA:  Headache EXAM: MRI CERVICAL SPINE WITHOUT CONTRAST TECHNIQUE: Multiplanar, multisequence MR imaging of the cervical spine was performed. No intravenous contrast was administered. COMPARISON:  None. FINDINGS: Alignment: Physiologic. Vertebrae: No fracture, evidence of discitis, or bone lesion. Cord: Normal signal and morphology. Posterior Fossa, vertebral arteries, paraspinal tissues: The cerebellar tonsils extend 8 mm below the foramen magnum. Disc levels: C1-2: Unremarkable. C2-3: Normal disc space and facet joints. There is no spinal canal stenosis. No neural foraminal stenosis. C3-4: Left uncovertebral hypertrophy. There is no spinal canal stenosis. Mild left neural foraminal stenosis. C4-5: Normal disc space and facet joints. There is no spinal canal stenosis. No neural foraminal stenosis. C5-6: Normal disc  space and facet joints. There is no spinal canal stenosis. No neural foraminal stenosis. C6-7: Normal disc space and facet joints. There is no spinal canal stenosis. No neural foraminal stenosis. C7-T1: Normal

## 2021-07-05 NOTE — TOC Transition Note (Signed)
Transition of Care (TOC) - CM/SW Discharge Note ? ? ?Patient Details  ?Name: Barbara Simon ?MRN: [id number] ?Date of Birth: [birth year] ? ?Transition of Care (TOC) CM/SW Contact:  ?Kermit Balo, RN ?Phone Number: ?07/05/2021, 1:20 PM ? ? ?Clinical Narrative:    ?Patient is discharging home with self care. Medications for home to be delivered to the room per [facility] pharmacy.  ?Pt has transport home. ? ? ?Final next level of care: Home/Self Care ?Barriers to Discharge: No Barriers Identified ? ? ?Patient Goals and CMS Choice ?  ?  ?  ? ?Discharge Placement ?  ?           ?  ?  ?  ?  ? ?Discharge Plan and Services ?  ?  ?           ?  ?  ?  ?  ?  ?  ?  ?  ?  ?  ? ?Social Determinants of Health (SDOH) Interventions ?  ? ? ?Readmission Risk Interventions ?   ? View : No data to display.  ?  ?  ?  ? ? ? ? ? ?
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
## 2021-07-05 NOTE — Discharge Instructions (Addendum)
Psychiatry plan ? ?You will be seeing Dr. Bea Graff for your hospital followup. You should keep seeing your therapist Ms. Winstead and you will be referred to see a psychiatrist. ?You will be discharged with a starter pack of lamotrigine. Over the next month, you will take 25 mg for 2 weeks and then 50 mg for 2 weeks. The main side effect we discussed on this medication is a rash.  ?Continue taking your celexa for now, but if you continue to feel on edge/irritable/have racing thoughts for another week cut it down to 20 mg ?You will be discharged with seroquel 50 mg pills. Take this as needed for sleep until the lamotrigine is in your system. You can also take 1/2 pill (25 mg) for anxiety.  ? ?For your headaches, we recommend you avoid NSAIDs for now.  ?We also recommend that you do not operate heavy machinery until your symptoms improve.  ? ?Neurosurgery does not feel there is any need for urgent management of your Chiari malformation. They will follow up with you as an outpatient.  ? ?Please follow up with your primary care physician in 1-2 week.  ?

## 2021-07-09 ENCOUNTER — Encounter: Payer: Self-pay | Admitting: Orthopedic Surgery

## 2021-07-09 ENCOUNTER — Ambulatory Visit: Payer: 59 | Admitting: Orthopedic Surgery

## 2021-07-09 ENCOUNTER — Institutional Professional Consult (permissible substitution): Payer: 59 | Admitting: Behavioral Health

## 2021-07-09 ENCOUNTER — Telehealth: Payer: Self-pay | Admitting: Behavioral Health

## 2021-07-09 ENCOUNTER — Ambulatory Visit (INDEPENDENT_AMBULATORY_CARE_PROVIDER_SITE_OTHER): Payer: 59 | Admitting: Orthopedic Surgery

## 2021-07-09 VITALS — BP 106/71 | HR 67 | Ht 63.0 in | Wt 173.0 lb

## 2021-07-09 DIAGNOSIS — G5602 Carpal tunnel syndrome, left upper limb: Secondary | ICD-10-CM

## 2021-07-09 DIAGNOSIS — G5601 Carpal tunnel syndrome, right upper limb: Secondary | ICD-10-CM

## 2021-07-09 DIAGNOSIS — G5603 Carpal tunnel syndrome, bilateral upper limbs: Secondary | ICD-10-CM | POA: Diagnosis not present

## 2021-07-09 MED ORDER — LIDOCAINE HCL 1 % IJ SOLN
1.0000 mL | INTRAMUSCULAR | Status: AC | PRN
  Administered 2021-07-09: 1 mL

## 2021-07-09 MED ORDER — BETAMETHASONE SOD PHOS & ACET 6 (3-3) MG/ML IJ SUSP
6.0000 mg | INTRAMUSCULAR | Status: AC | PRN
  Administered 2021-07-09: 6 mg via INTRA_ARTICULAR

## 2021-07-09 NOTE — Telephone Encounter (Signed)
Lft Pt 2 msgs & did speak w/her on the 3rd attempt. Pt is on the phone w/the [facility]. They contacted her unexpectedly & she felt she needed to take the call. Related to Pt Clinician understands & we can r/s. Pt grateful & apologetic. ? ?Dr. Theodis Shove ?

## 2021-07-09 NOTE — Progress Notes (Signed)
? ?Office Visit Note ?  ?Patient: Barbara [facility]sCrystal [facility]           ?Date of Birth: 11-16-1975           ?MRN: [id number] ?Visit Date: 07/09/2021 ?             ?Requested by: Vickki HearingHarrison, Stanley E, MD ?[address] ?[place] [id number] ?PCP: [name], Iulia, MD ? ? ?Assessment & Plan: ?Visit Diagnoses:  ?1. Carpal tunnel syndrome, bilateral   ? ? ?Plan: Patient's history exam findings seem consistent with carpal tunnel syndrome.  She did have an EMG/nerve conduction study done on 05/09/2021 which suggested mild carpal tunnel syndrome.  She has failed conservative management so far with night splints.  We discussed the nature of carpal tunnel syndrome as well as diagnosis, prognosis, and both surgical and conservative treatment options.  After discussion, she would like to proceed with corticosteroid injections in the bilateral carpal tunnels.  We discussed the risk of injections including bleeding, infection, incomplete symptom relief, need for additional procedure.  I can see her back in the office when the injections wear off. ? ?Follow-Up Instructions: No follow-ups on file.  ? ?Orders:  ?No orders of the defined types were placed in this encounter. ? ?No orders of the defined types were placed in this encounter. ? ? ? ? Procedures: ?Hand/UE Inj: R carpal tunnel for carpal tunnel syndrome on 07/09/2021 5:17 PM ?Indications: diagnostic and therapeutic ?Details: 25 G needle, volar approach ?Medications: 1 mL lidocaine 1 %; 6 mg betamethasone acetate-betamethasone sodium phosphate 6 (3-3) MG/ML ?Procedure, treatment alternatives, risks and benefits explained, specific risks discussed. Consent was given by the patient. Immediately prior to procedure a time out was called to verify the correct patient, procedure, equipment, support staff and site/side marked as required. Patient was prepped and draped in the usual sterile fashion.  ? ? ?Hand/UE Inj: L carpal tunnel for carpal tunnel syndrome on 07/09/2021 5:17  PM ?Indications: diagnostic and therapeutic ?Details: 25 G needle, volar approach ?Medications: 1 mL lidocaine 1 %; 6 mg betamethasone acetate-betamethasone sodium phosphate 6 (3-3) MG/ML ?Procedure, treatment alternatives, risks and benefits explained, specific risks discussed. Consent was given by the patient. Immediately prior to procedure a time out was called to verify the correct patient, procedure, equipment, support staff and site/side marked as required. Patient was prepped and draped in the usual sterile fashion.  ? ? ? ? ?Clinical Data: ?No additional findings. ? ? ?Subjective: ?Chief Complaint  ?Patient presents with  ? Right Hand - Pain, Numbness, Weakness  ?  Right handed, Pain:3/10, onset long time, symptoms run up to shoulders, was occasional now it is constant and it was fast, runs hot water on hands to help with the stiffness and swelling in the mornings, wear metal braces during the day and copper braces at night.   ? Left Hand - Pain, Numbness, Weakness  ? ? ?This is a 46 year old right-hand-dominant female who until recently worked for [employer] and presents with bilateral hand numbness, tingling, and pain.  This started around December.  She describes pain involving all of her fingers with associated numbness and paresthesia.  The pain radiates proximally to the elbow bilaterally.  She cannot tell if the small finger is involved or not but it feels to her like it is the whole hand.  She previously was waking every night with nocturnal symptoms.  She has since started wearing wrist braces which has helped some.  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  Since she has moved from standing to a different job her symptoms have improved somewhat.  Her right hand is more affected than the left. ? ?Weakness ?Associated symptoms include weakness.  ? ?Review of Systems  ?Neurological:  Positive for weakness.   ? ? ?Objective: ?Vital Signs: BP 106/71 (BP Location: Left Arm, Patient Position: Sitting)   Pulse 67   Ht 5\' 3"  (1.6 m)   Wt 173 lb (78.5 kg)   BMI 30.65 kg/m?  ? ?Physical Exam ?Constitutional:   ?   Appearance: Normal appearance.  ?Cardiovascular:  ?   Rate and Rhythm: Normal rate.  ?   Pulses: Normal pulses.  ?Pulmonary:  ?   Effort: Pulmonary effort is normal.  ?Skin: ?   General: Skin is warm and dry.  ?   Capillary Refill: Capillary refill takes less than 2 seconds.  ?Neurological:  ?   Mental Status: She is alert.  ? ? ?Right Hand Exam  ? ?Tenderness  ?The patient is experiencing no tenderness.  ? ?Range of Motion  ?The patient has normal right wrist ROM.  ? ?Other  ?Erythema: absent ?Sensation: normal ?Pulse: present ? ?Comments:  Negative Tinel.  Positive Phalen sign.  Positive Durkan sign at ~10 seconds.  5/5 thenar motor strength without atrophy.  ? ? ?Left Hand Exam  ? ?Tenderness  ?The patient is experiencing no tenderness.  ? ?Range of Motion  ?The patient has normal left wrist ROM. ? ?Other  ?Erythema: absent ?Sensation: normal ?Pulse: present ? ?Comments:  Negative Tinel.  Positive Phalen sign.  Positive Durkan sign at ~10 seconds.  5/5 thenar motor strength without atrophy.  ? ? ? ? ?Specialty Comments:  ?No specialty comments available. ? ?Imaging: ?No results found. ? ? ?PMFS History: ?Patient Active Problem List  ? Diagnosis Date Noted  ? Headache 07/04/2021  ? 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  ? Syncope 07/03/2021  ? Seizure-like activity (HCC) 07/03/2021  ? Posterior vaginal wall prolapse 06/14/2021  ? Healthcare maintenance 05/13/2021  ? Neck muscle spasm 01/09/2021  ? Epigastric discomfort with nausea and vomiting 01/09/2021  ? History of colonic polyps 2019 01/08/2021  ? Asthma 12/24/2020  ? Tobacco use 11/20/2020  ? Allergic rhinitis 11/20/2020  ? Stress at home 09/25/2020  ? Bipolar II disorder (HCC) 07/24/2020  ? Peripheral neuropathy 07/24/2020  ? Carpal tunnel syndrome,  bilateral 07/24/2020  ? Opioid use disorder 12/20/2015  ? Cervical high risk HPV (human papillomavirus) test positive 03/29/2013  ? History of hepatitis C 02/15/2013  ? Major depression 02/03/2013  ? ?Past Medical History:  ?Diagnosis Date  ? Abnormal Pap smear   ? LSIL  ? Anxiety   ? Bipolar 1 disorder (HCC)   ? Depression   ? Hepatitis C   ? two years ago was dx  ? Ovarian cyst   ? Thyrotoxicosis   ?  ?Family History  ?Problem Relation Age of Onset  ? Cancer Maternal Grandmother   ? Cancer Maternal Aunt   ?  ?Past Surgical History:  ?Procedure Laterality Date  ? ABDOMINAL SURGERY    ? APPENDECTOMY    ? TUBAL LIGATION    ? ?Social History  ? ?Occupational History  ? Not on file  ?Tobacco Use  ? Smoking status: Every Day  ?  Packs/day: 1.00  ?  Types: Cigarettes  ? Smokeless tobacco: Never  ? Tobacco comments:  ?  1  PPD  ?Vaping Use  ? Vaping Use: Never used  ?Substance and Sexual Activity  ? Alcohol use: No  ? Drug use: No  ? Sexual activity: Never  ?  Birth control/protection: None  ? ? ? ? ? ? ?

## 2021-07-10 ENCOUNTER — Telehealth: Payer: Self-pay | Admitting: Internal Medicine

## 2021-07-10 NOTE — Telephone Encounter (Signed)
TOC HFU APPT ? ? ?  MRN: [id number]  ?Date: 07/18/2021 Status: Sch  ?Time: 1:45 PM Length: 30  ?Visit Type: OPEN ESTABLISHED [726] Copay: $0.00  ?Provider: Ilene Qua, MD    ? ?

## 2021-07-11 ENCOUNTER — Telehealth: Payer: Self-pay

## 2021-07-11 NOTE — Telephone Encounter (Signed)
Transition Care Management Unsuccessful Follow-up Telephone Call ? ?Date of discharge and from where:  [facility] 07/10/[facility] ? ?Attempts:  1st Attempt ? ?Reason for unsuccessful TCM follow-up call:  Unable to leave message ? ?Jodelle Gross, RN, BSN, CCM ?Care Management Coordinator ?[facility] ?Phone: [phone]/Fax: [phone]  ?

## 2021-07-16 ENCOUNTER — Encounter: Payer: Self-pay | Admitting: Obstetrics & Gynecology

## 2021-07-16 ENCOUNTER — Ambulatory Visit (INDEPENDENT_AMBULATORY_CARE_PROVIDER_SITE_OTHER): Payer: 59 | Admitting: Obstetrics & Gynecology

## 2021-07-16 VITALS — BP 90/59 | HR 70 | Ht 63.0 in | Wt 184.0 lb

## 2021-07-16 DIAGNOSIS — N920 Excessive and frequent menstruation with regular cycle: Secondary | ICD-10-CM

## 2021-07-16 DIAGNOSIS — N852 Hypertrophy of uterus: Secondary | ICD-10-CM

## 2021-07-16 DIAGNOSIS — K5909 Other constipation: Secondary | ICD-10-CM | POA: Diagnosis not present

## 2021-07-16 DIAGNOSIS — N816 Rectocele: Secondary | ICD-10-CM

## 2021-07-16 MED ORDER — POLYETHYLENE GLYCOL 3350 17 GM/SCOOP PO POWD: ORAL | 11 refills | Status: AC

## 2021-07-16 NOTE — Progress Notes (Signed)
? ? ? ? ?Chief Complaint  ?Patient presents with  ? New Patient (Initial Visit)  ? Vaginal Prolapse  ? ? ? ? ?46 y.o. G3P3003 No LMP recorded. The current method of family planning is tubal ligation. ? ?Outpatient Encounter Medications as of 07/16/2021  ?Medication Sig Note  ? acetaminophen (TYLENOL) 500 MG tablet Take 500 mg by mouth every 6 (six) hours as needed for moderate pain.   ? albuterol (VENTOLIN HFA) 108 (90 Base) MCG/ACT inhaler Inhale 2 puffs into the lungs every 6 (six) hours as needed for wheezing or shortness of breath.   ? Buprenorphine HCl-Naloxone HCl 8-2 MG FILM Place 1 Film under the tongue in the morning and at bedtime. 07/03/2021: Pt has 1/4 of strip  ? citalopram (CELEXA) 40 MG tablet Take 1 tablet (40 mg total) by mouth daily.   ? fluticasone (FLOVENT HFA) 44 MCG/ACT inhaler Inhale 1 puff into the lungs in the morning and at bedtime. Rinse your mouth with water after each use.   ? gabapentin (NEURONTIN) 300 MG capsule Take 3 capsules (900 mg total) by mouth 3 (three) times daily.   ? lamoTRIgine (LAMICTAL) 25 MG tablet Take 1 tablet (25 mg total) by mouth daily for 14 days, THEN 2 tablets (50 mg total) daily for 14 days.   ? pantoprazole (PROTONIX) 40 MG tablet Take 1 tablet (40 mg total) by mouth daily.   ? polyethylene glycol powder (GLYCOLAX/MIRALAX) 17 GM/SCOOP powder 1-4 scoops daily or as needed   ? QUEtiapine (SEROQUEL) 50 MG tablet Take 1 tablet (50 mg total) by mouth at bedtime.   ? ?No facility-administered encounter medications on file as of 07/16/2021.  ? ? ?Subjective ?Pt with long standing constipation 15 year ?Has BM now weekly on average but historically much much less, once a month n average ?Referred due to rectocoele ?History of narcotic drug use ?On suboxone the last year ? ?Past Medical History:  ?Diagnosis Date  ? Abnormal Pap smear   ? LSIL  ? Anxiety   ? Bipolar 1 disorder (HCC)   ? Depression   ? Hepatitis C   ? two years ago was dx  ? Ovarian cyst   ? Thyrotoxicosis    ? ? ?Past Surgical History:  ?Procedure Laterality Date  ? ABDOMINAL SURGERY    ? APPENDECTOMY    ? TUBAL LIGATION    ? ? ?OB History   ? ? Gravida  ?3  ? Para  ?3  ? Term  ?3  ? Preterm  ?   ? AB  ?   ? Living  ?3  ?  ? ? SAB  ?   ? IAB  ?   ? Ectopic  ?   ? Multiple  ?   ? Live Births  ?3  ?   ?  ?  ? ? ?Allergies  ?Allergen Reactions  ? Toradol [Ketorolac Tromethamine] Hives and Itching  ? Adhesive [Tape] Itching and Rash  ? ? ?Social History  ? ?Socioeconomic History  ? Marital status: Married  ?  Spouse name: Not on file  ? Number of children: Not on file  ? Years of education: Not on file  ? Highest education level: Not on file  ?Occupational History  ? Not on file  ?Tobacco Use  ? Smoking status: Every Day  ?  Packs/day: 1.00  ?  Types: Cigarettes  ? Smokeless tobacco: Never  ? Tobacco comments:  ?  1 PPD  ?Vaping Use  ?  Vaping Use: Never used  ?Substance and Sexual Activity  ? Alcohol use: No  ? Drug use: No  ? Sexual activity: Never  ?  Birth control/protection: None  ?Other Topics Concern  ? Not on file  ?Social History Narrative  ? Not on file  ? ?Social Determinants of Health  ? ?Financial Resource Strain: High Risk  ? Difficulty of Paying Living Expenses: Very hard  ?Food Insecurity: Food Insecurity Present  ? Worried About Programme researcher, broadcasting/film/video in the Last Year: Sometimes true  ? Ran Out of Food in the Last Year: Often true  ?Transportation Needs: No Transportation Needs  ? Lack of Transportation (Medical): No  ? Lack of Transportation (Non-Medical): No  ?Physical Activity: Insufficiently Active  ? Days of Exercise per Week: 2 days  ? Minutes of Exercise per Session: 20 min  ?Stress: Stress Concern Present  ? Feeling of Stress : Very much  ?Social Connections: Socially Integrated  ? Frequency of Communication with Friends and Family: More than three times a week  ? Frequency of Social Gatherings with Friends and Family: More than three times a week  ? Attends Religious Services: More than 4 times per  year  ? Active Member of Clubs or Organizations: Yes  ? Attends Banker Meetings: More than 4 times per year  ? Marital Status: Living with partner  ? ? ?Family History  ?Problem Relation Age of Onset  ? Cancer Maternal Grandmother   ? Cancer Maternal Aunt   ? ? ?Medications:       ?Current Outpatient Medications:  ?  acetaminophen (TYLENOL) 500 MG tablet, Take 500 mg by mouth every 6 (six) hours as needed for moderate pain., Disp: , Rfl:  ?  albuterol (VENTOLIN HFA) 108 (90 Base) MCG/ACT inhaler, Inhale 2 puffs into the lungs every 6 (six) hours as needed for wheezing or shortness of breath., Disp: 8 g, Rfl: 2 ?  Buprenorphine HCl-Naloxone HCl 8-2 MG FILM, Place 1 Film under the tongue in the morning and at bedtime., Disp: 60 each, Rfl: 0 ?  citalopram (CELEXA) 40 MG tablet, Take 1 tablet (40 mg total) by mouth daily., Disp: 30 tablet, Rfl: 2 ?  fluticasone (FLOVENT HFA) 44 MCG/ACT inhaler, Inhale 1 puff into the lungs in the morning and at bedtime. Rinse your mouth with water after each use., Disp: 1 each, Rfl: 1 ?  gabapentin (NEURONTIN) 300 MG capsule, Take 3 capsules (900 mg total) by mouth 3 (three) times daily., Disp: 240 capsule, Rfl: 2 ?  lamoTRIgine (LAMICTAL) 25 MG tablet, Take 1 tablet (25 mg total) by mouth daily for 14 days, THEN 2 tablets (50 mg total) daily for 14 days., Disp: 42 tablet, Rfl: 0 ?  pantoprazole (PROTONIX) 40 MG tablet, Take 1 tablet (40 mg total) by mouth daily., Disp: 30 tablet, Rfl: 2 ?  polyethylene glycol powder (GLYCOLAX/MIRALAX) 17 GM/SCOOP powder, 1-4 scoops daily or as needed, Disp: 255 g, Rfl: 11 ?  QUEtiapine (SEROQUEL) 50 MG tablet, Take 1 tablet (50 mg total) by mouth at bedtime., Disp: 30 tablet, Rfl: 0 ? ?Objective ?Blood pressure (!) 90/59, pulse 70, height 5\' 3"  (1.6 m), weight 184 lb (83.5 kg). ? ?General WDWN female NAD ?Vulva:  normal appearing vulva with no masses, tenderness or lesions ?Vagina:  Grade 3 rectocoele ?Cervix:  Normal no lesions ?Uterus:   uterus is enlarged, 10 weeks size,  contour, position, consistency, mobility, non-tender ?Adnexa: ovaries:present,  normal adnexa in size, nontender and no masses ? ? ?  Pertinent ROS ?No burning with urination, frequency or urgency ?No nausea, vomiting or diarrhea ?Nor fever chills or other constitutional symptoms ? ? ?Labs or studies ? ? ? ? ?Impression + Management Plan: ?Diagnoses this Encounter:: ?  ICD-10-CM   ?1. Rectocele, Grade 3  N81.6   ?  ?2. Constipation, chronic  K59.09   ? constipation  ?  ?3. Menorrhagia with regular cycle  N92.0 US PELVIS (TRANSABDOMINAL ONLY)  ?  US PELVIS TRANSVAGINAL NON-OB (TV ONLY)  ?  ?4. Enlarged uterus  N85.2 US PELVIS (TRANSABDOMINAL ONLY)  ?  US PELVIS TRANSVAGINAL NON-OB (TV ONLY)  ?  ? ? ? ? ?Medications prescribed during  this encounter: ?Meds ordered this encounter  ?Medications  ? polyethylene glycol powder (GLYCOLAX/MIRALAX) 17 GM/SCOOP powder  ?  Sig: 1-4 scoops daily or as needed  ?  Dispense:  255 g  ?  Refill:  11  ? ? ?Labs or Scans Ordered during this encounter: ?Orders Placed This Encounter  ?Procedures  ? US PELVIS (TRANSABDOMINAL ONLY)  ? US PELVIS TRANSVAGINAL NON-OB (TV ONLY)  ? ? ? ? ?Follow up ?Return in about 6 weeks (around 08/27/2021) for GYN sono, Follow up, with Dr Despina Hidden. ? ? ? ? ? ?. ?

## 2021-07-18 ENCOUNTER — Other Ambulatory Visit: Payer: Self-pay

## 2021-07-18 ENCOUNTER — Encounter: Payer: Self-pay | Admitting: Internal Medicine

## 2021-07-18 ENCOUNTER — Ambulatory Visit (INDEPENDENT_AMBULATORY_CARE_PROVIDER_SITE_OTHER): Payer: 59 | Admitting: Internal Medicine

## 2021-07-18 VITALS — BP 95/75 | HR 82 | Temp 98.5°F | Ht 63.0 in | Wt 174.7 lb

## 2021-07-18 DIAGNOSIS — M62838 Other muscle spasm: Secondary | ICD-10-CM | POA: Diagnosis not present

## 2021-07-18 DIAGNOSIS — F1190 Opioid use, unspecified, uncomplicated: Secondary | ICD-10-CM

## 2021-07-18 DIAGNOSIS — R569 Unspecified convulsions: Secondary | ICD-10-CM

## 2021-07-18 DIAGNOSIS — F3177 Bipolar disorder, in partial remission, most recent episode mixed: Secondary | ICD-10-CM | POA: Diagnosis not present

## 2021-07-18 DIAGNOSIS — F439 Reaction to severe stress, unspecified: Secondary | ICD-10-CM

## 2021-07-18 MED ORDER — METHOCARBAMOL 750 MG PO TABS: 1500.0000 mg | ORAL_TABLET | Freq: Three times a day (TID) | ORAL | 0 refills | Status: DC | PRN

## 2021-07-18 NOTE — Assessment & Plan Note (Signed)
Patient unemployed which is a cause of stress.  ?

## 2021-07-18 NOTE — Assessment & Plan Note (Signed)
Increasing suboxone to TID. Next fill would be in about two weeks, instructed patient to follow up in clinic in two weeks for a refill. (see seizure like activity) Patient is having cravings and increased pain.  ?- 2 week follow up ?- TID suboxone ?

## 2021-07-18 NOTE — Patient Instructions (Addendum)
Barbara Simon ? ?It was a pleasure seeing you in the clinic today.  ? ?We talked about your pain, your spasms, and your medications today. ? ?Pain- Increase your suboxone to three times a day. You can also try taking ibuprofen and robaxin three times a day.  ? ?Psychiatry- someone should reach out to you to schedule an appointment. In the mean time continue taking your seroquel.  ? ?Please call our clinic at [phone] if you have any questions or concerns. The best time to call is Monday-Friday from 9am-4pm, but there is someone available 24/7 at the same number. If you need medication refills, please notify your pharmacy one week in advance and they will send [name] a request. ?  ?Thank you for letting us take part in your care. We look forward to seeing you next time! ? ?

## 2021-07-18 NOTE — Progress Notes (Signed)
? ?  CC: shaking, whole body pain ? ?HPI: ? ?[name] is a 46 y.o. PMH noted below, who presents to the [facility] with complaints of whole body pain. To see the management of his acute and chronic conditions, please refer to the A&P note under the encounters tab.  ? ?Past Medical History:  ?Diagnosis Date  ? Abnormal Pap smear   ? LSIL  ? Anxiety   ? Bipolar 1 disorder (HCC)   ? Depression   ? Hepatitis C   ? two years ago was dx  ? Ovarian cyst   ? Thyrotoxicosis   ? ?Review of Systems:  positive for stress, anxiety, depression, cravings, muscle shaking, sleepiness, fatigue ? ?Physical Exam: ?Gen: middle aged woman in NAD ?HEENT: normocephalic atraumatic, MMM clear oropharynx with no irritation or exudate, normal left tympanic membrane with no effusion or wax impaction ?CV: RRR, no m/r/g  ?Resp: CTAB, normal WOB ?GI: soft, nontender ?MSK: moves all extremities without difficulty ?Skin:warm and dry ?Neuro:alert answering questions appropriately, normal sensation, normal strength, intermittent rhythmic jerking movements of both the axial and appendicular skeleton, worsened with stress, suppressible with distraction or effort, no  ?Psych: agitated, tearful ? ? ?Assessment & Plan:  ? ?See Encounters Tab for problem based charting. ? ?Patient seen with Dr. Antony Contras  ? ?

## 2021-07-18 NOTE — Assessment & Plan Note (Signed)
Appears to be psychogenic in nature, suppresses with distraction or concentration, worse with emotion/stress. Patient has had these jerking movements since before starting her seroquel. Do not think these movements fit with tardive dyskinesia and since she started having them before starting the antipsychotics the timeline does not fit. Pain appears to be a big driver of these in addition to stress. She endorses trying to obtain xanax outside of the clinic as that helps with her stress and that she is having cravings to get back on pain medication.  ?- robaxin 1500 TID ?- increase suboxone to TID ?- referral to psychiatry ?- close follow up ?

## 2021-07-18 NOTE — Assessment & Plan Note (Signed)
Muscle relaxer 1500 robaxin TID PRN. Likely related to psychogenic seizure like activity.  ?

## 2021-07-29 NOTE — Progress Notes (Signed)
Internal Medicine Clinic Attending ? ?I saw and evaluated the patient.  I personally confirmed the key portions of the history and exam documented by Dr. Darrick Huntsman and I reviewed pertinent patient test results.  The assessment, diagnosis, and plan were formulated together and I agree with the documentation in the resident?s note.  ? ?Patient here with whole body shaking that I believe is psychogenic in nature. Shaking stops when she is distracted or being examined. Pattern is inconsistent and does not appear to be tardive dyskinesia. She has uncontrolled pain and reports cravings and is scared she is going to relapse. I think her chronic pain might be driving a lot of her anxiety and behavior. We discussed increasing suboxone for better control. She is currently taking 8-2 mg BID and filled a 30 day supply of 4/27. We instructed her to increase this to TID dosing and follow up with [name] in 2 weeks (her supply should last until then).  ? ?She needs to see psychiatry. We discussed this, referral placed.   ?

## 2021-07-30 ENCOUNTER — Institutional Professional Consult (permissible substitution): Payer: 59 | Admitting: Behavioral Health

## 2021-08-01 ENCOUNTER — Other Ambulatory Visit: Payer: Self-pay

## 2021-08-01 ENCOUNTER — Encounter: Payer: Self-pay | Admitting: Student

## 2021-08-01 ENCOUNTER — Ambulatory Visit (INDEPENDENT_AMBULATORY_CARE_PROVIDER_SITE_OTHER): Payer: 59 | Admitting: Student

## 2021-08-01 VITALS — BP 106/64 | HR 69 | Temp 97.5°F | Ht 63.0 in | Wt 169.8 lb

## 2021-08-01 DIAGNOSIS — R569 Unspecified convulsions: Secondary | ICD-10-CM | POA: Diagnosis not present

## 2021-08-01 DIAGNOSIS — F1190 Opioid use, unspecified, uncomplicated: Secondary | ICD-10-CM

## 2021-08-01 DIAGNOSIS — J3089 Other allergic rhinitis: Secondary | ICD-10-CM

## 2021-08-01 DIAGNOSIS — J339 Nasal polyp, unspecified: Secondary | ICD-10-CM | POA: Diagnosis not present

## 2021-08-01 MED ORDER — BUPRENORPHINE HCL-NALOXONE HCL 8-2 MG SL FILM: 1.0000 | ORAL_FILM | Freq: Three times a day (TID) | SUBLINGUAL | 0 refills | Status: AC

## 2021-08-01 MED ORDER — LAMOTRIGINE 25 MG PO TABS: ORAL_TABLET | ORAL | 0 refills | Status: DC

## 2021-08-01 MED ORDER — METHOCARBAMOL 750 MG PO TABS: 1500.0000 mg | ORAL_TABLET | Freq: Three times a day (TID) | ORAL | 0 refills | Status: AC | PRN

## 2021-08-01 NOTE — Patient Instructions (Addendum)
Shaking episodes  I have refill your Lamictal  Stress Please call the [facility] to schedule a follow up  Opoid use We will refill your suboxone If you feel that the suboxone is not helping with pain or craving you may need a higher level of care. You can call at one of the following locations to discuss this  [facility] [phone] [address] - [place], [place]   [facility] [phone] [address] [place], [place]   [facility] [phone] - [place], [place]  [facility]  Syringe exchange, assistance with treatment [phone]

## 2021-08-02 NOTE — Assessment & Plan Note (Signed)
Continues have difficulty breathing out of her left nostril despite intranasal steroids and allergy medications.  CT head recently with large mucous retention cyst versus polyp within the left maxillary sinus, measuring at least 2.7 cm.  She was requesting follow-up with ENT referral made.

## 2021-08-02 NOTE — Progress Notes (Signed)
Established Patient Office Visit  Subjective   Patient ID: Barbara Simon, female    DOB: [birth year]  Age: 46 y.o. MRN: [id number]  Chief Complaint  Patient presents with   Follow-up    Barbara Simon is a 46 year old female who presents today for follow-up of seizure-like activity. Please refer to problem based charting for further details and assessment and plan of current problem and chronic medical conditions.    Patient Active Problem List   Diagnosis Date Noted   Headache 07/04/2021   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   Syncope 07/03/2021   Seizure-like activity (HCC) 07/03/2021   Posterior vaginal wall prolapse 06/14/2021   Healthcare maintenance 05/13/2021   Neck muscle spasm 01/09/2021   Epigastric discomfort with nausea and vomiting 01/09/2021   History of colonic polyps 2019 01/08/2021   Asthma 12/24/2020   Tobacco use 11/20/2020   Nasal polyp 11/20/2020   Stress at home 09/25/2020   Bipolar II disorder (HCC) 07/24/2020   Peripheral neuropathy 07/24/2020   Carpal tunnel syndrome, bilateral 07/24/2020   Opioid use disorder 12/20/2015   Cervical high risk HPV (human papillomavirus) test positive 03/29/2013   History of hepatitis C 02/15/2013   Major depression 02/03/2013      Review of Systems  All other systems reviewed and are negative.    Objective:     BP 106/64 (BP Location: Right Arm, Patient Position: Sitting, Cuff Size: Normal)   Pulse 69   Temp (!) 97.5 F (36.4 C) (Oral)   Ht 5\' 3"  (1.6 m)   Wt 169 lb 12.8 oz (77 kg)   SpO2 98%   BMI 30.08 kg/m     Physical Exam Constitutional:      Appearance: Normal appearance. She is obese.  HENT:     Nose: Nose normal.     Comments: Exam limited by head shaking, no obvious polyps or edema Eyes:     Extraocular Movements: Extraocular movements intact.     Pupils: Pupils are equal, round, and reactive to light.  Cardiovascular:     Rate and Rhythm: Normal rate and regular rhythm.   Pulmonary:     Effort: Pulmonary effort is normal.     Breath sounds: Normal breath sounds.  Abdominal:     General: Abdomen is flat. Bowel sounds are normal.     Palpations: Abdomen is soft.  Musculoskeletal:        General: Normal range of motion.     Right lower leg: No edema.     Left lower leg: No edema.  Skin:    General: Skin is warm and dry.  Neurological:     Mental Status: She is alert.  Psychiatric:     Comments: Agitated, somewhat pressured speech, intermittent episodes of head and upper extremity shaking improved with distraction     No results found for any visits on 08/01/21.     The ASCVD Risk score (Arnett DK, et al., 2019) failed to calculate for the following reasons:   Cannot find a previous HDL lab   Cannot find a previous total cholesterol lab    Assessment & Plan:   Problem List Items Addressed This Visit       Respiratory   Nasal polyp - Primary    Continues have difficulty breathing out of her left nostril despite intranasal steroids and allergy medications.  CT head recently with large mucous retention cyst versus polyp within the left maxillary sinus, measuring at least 2.7 cm.  She was requesting follow-up  with ENT referral made.        Relevant Orders   Ambulatory referral to ENT     Other   Opioid use disorder (Chronic)    Increased to 8-2 mg films 3 times daily.  She is thus occasionally have cravings but these are manageable.  Denies any withdrawals or relapses.  Still having significant pain.  Discussed that pain may be contributing to her seizure-like activity, and discussed transitioning to methadone to help with better pain control.  Patient previously was on methadone and would not like to return to this.  Feels like Suboxone is adequately managing her opiate use.  Resources given for regarding this if she ate or to change her mind.  Continue Suboxone 8-2 mg 3 times daily ToxAssure today Follow-up for OUD in 4 weeks        Relevant Medications   Buprenorphine HCl-Naloxone HCl 8-2 MG FILM   Other Relevant Orders   ToxAssure Select,+Antidepr,UR   Seizure-like activity (HCC)    Continues to have episodes of shaking worse at night.  Has pain associated with this.  She did feel that Robaxin helped minimally.  No improvement in pain with increasing the Suboxone.  He has stopped taking her Seroquel due to this making her feel more sleepy.  He is also out of Lamictal.  Has not been called about psychiatry follow-up I talked to our referral coordinator about this.  Limited practices will take her insurance.  Gave her [facility].  She will call to make an appointment.    Refill of Lamictal Continue Robaxin Referral to psychiatry at [facility]        Return in about 1 month (around 09/01/2021) for oud f/u.    Quincy Simmonds, MD  Discussed with Dr. Antony Contras

## 2021-08-02 NOTE — Assessment & Plan Note (Signed)
Increased to 8-2 mg films 3 times daily.  She is thus occasionally have cravings but these are manageable.  Denies any withdrawals or relapses.  Still having significant pain.  Discussed that pain may be contributing to her seizure-like activity, and discussed transitioning to methadone to help with better pain control.  Patient previously was on methadone and would not like to return to this.  Feels like Suboxone is adequately managing her opiate use.  Resources given for regarding this if she ate or to change her mind.  Continue Suboxone 8-2 mg 3 times daily ToxAssure today Follow-up for OUD in 4 weeks

## 2021-08-02 NOTE — Assessment & Plan Note (Signed)
Continues to have episodes of shaking worse at night.  Has pain associated with this.  She did feel that Robaxin helped minimally.  No improvement in pain with increasing the Suboxone.  He has stopped taking her Seroquel due to this making her feel more sleepy.  He is also out of Lamictal.  Has not been called about psychiatry follow-up I talked to our referral coordinator about this.  Limited practices will take her insurance.  Gave her [facility].  She will call to make an appointment.    Refill of Lamictal Continue Robaxin Referral to psychiatry at the [place]

## 2021-08-07 ENCOUNTER — Ambulatory Visit: Payer: 59 | Admitting: Physician Assistant

## 2021-08-07 ENCOUNTER — Telehealth: Payer: Self-pay

## 2021-08-07 LAB — TOXASSURE SELECT,+ANTIDEPR,UR

## 2021-08-07 NOTE — Telephone Encounter (Signed)
DECISION :      Out come  Approved today  PA Case: [id number], Status: Approved,    Coverage Starts on: 08/07/2021 12:00 AM, Coverage Ends on: 08/08/2022 12:00 AM.    Questions? Contact [phone].   Drug Buprenorphine HCl-Naloxone HCl 8-2MG  films   Form Teaching laboratory technician Prior Authorization Form (2017 NCPDP)      ( COPY SENT TO PHARMACY ALSO )

## 2021-08-07 NOTE — Telephone Encounter (Signed)
PA  for pt ( BUPRENORPHINE/NALOX ) came through on cover my meds.. was submitted with office notes and last UDA .Marland Kitchen Awaiting approval or denial

## 2021-08-08 NOTE — Progress Notes (Signed)
Internal Medicine Clinic Attending ? ?Case discussed with Dr. Liang  At the time of the visit.  We reviewed the resident?s history and exam and pertinent patient test results.  I agree with the assessment, diagnosis, and plan of care documented in the resident?s note. ? ?

## 2021-08-23 ENCOUNTER — Telehealth: Payer: Self-pay | Admitting: *Deleted

## 2021-08-23 ENCOUNTER — Ambulatory Visit (INDEPENDENT_AMBULATORY_CARE_PROVIDER_SITE_OTHER): Payer: 59 | Admitting: Student

## 2021-08-23 ENCOUNTER — Encounter: Payer: Self-pay | Admitting: Student

## 2021-08-23 DIAGNOSIS — R569 Unspecified convulsions: Secondary | ICD-10-CM

## 2021-08-23 MED ORDER — LAMOTRIGINE 25 MG PO TABS
50.0000 mg | ORAL_TABLET | Freq: Every day | ORAL | 2 refills | Status: DC
Start: 2021-08-23 — End: 2022-02-03

## 2021-08-23 MED ORDER — HYDROXYZINE HCL 50 MG PO TABS: 50.0000 mg | ORAL_TABLET | Freq: Four times a day (QID) | ORAL | 3 refills | Status: DC

## 2021-08-23 NOTE — Assessment & Plan Note (Addendum)
Patient evaluated via the telephone visit today as a follow-up to her recent ER visit at [facility]. Patient reports that 2 weeks ago, she went to the ER at [facility] with her shaking episodes. Patient reports the Lamictal that she started a week prior had not controlled her episodes. She was evaluated at the ER and diagnosed with psychogenic nonepileptic seizures (PNES). She was discharged home with hydroxyzine and referred to psychiatry. Patient states since she started hydroxyzine, her symptoms have improved. She does not have the shaking episodes every day like she previously did and she has not felt drowsy during the day. States she is run out of hydroxyzine and worried about symptoms worsening again. She she has also called psychiatry multiple times and they are all booked. Patient denies any urinary or bladder incontinence with her shaking episodes.  She is adherent to the rest of her medications.  Plan: -Refill hydroxyzine 50 mg every 6 hours -Refill Lamictal 50 mg daily, titrate up a needed to stabilize mood -Referred to [facility], gave the number and address to patient to call or visit. -Advised to follow-up with [name] as needed

## 2021-08-23 NOTE — Progress Notes (Signed)
   CC: ER follow-up  This is a telephone encounter between [name] and Barbara Simon on 08/23/2021 for follow-up on her recent ER visit. The visit was conducted with the patient located at home and Barbara Simon at [facility]. The patient's identity was confirmed using their DOB and current address. The patient has consented to being evaluated through a telephone encounter and understands the associated risks (an examination cannot be done and the patient may need to come in for an appointment) / benefits (allows the patient to remain at home, decreasing exposure to coronavirus). I personally spent 12 minutes on medical discussion.   HPI:  [name] is a 46 y.o. with PMH as below.   Please see A&P for assessment of the patient's acute and chronic medical conditions.   Past Medical History:  Diagnosis Date   Abnormal Pap smear    LSIL   Anxiety    Bipolar 1 disorder (HCC)    Depression    Hepatitis C    two years ago was dx   Ovarian cyst    Thyrotoxicosis    Review of Systems: Positive for anxiety, stress, shaking episodes.  Negative for any fevers, chills bowel or urinary incontinence.    Assessment & Plan:   See Encounters Tab for problem based charting.  Patient discussed with Dr. Emelia Salisbury, MD, MPH

## 2021-08-23 NOTE — Telephone Encounter (Signed)
Spoke with her and addressed her concerns.

## 2021-08-23 NOTE — Telephone Encounter (Signed)
Call from pt stating she had to go to the ER in [place] b/c  "we have not done anything for her". 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 And she needs a refill. Informed pt she will need an appt since we did prescribed this medication. No available appts this am - Telehealth appt schedule w/Dr Kirke Corin this afternoon @ 1515 PM.

## 2021-08-23 NOTE — Patient Instructions (Signed)
Thank you, Barbara Simon for allowing [name] to provide your care today. Today we discussed your recent ER visit for your seizure-like disorders.  I am glad your symptoms are improving with the hydroxyzine.  I have refilled this medication as well as serum Lamictal.  Please follow-up with the recommended [facility] as soon as possible.  I have ordered the following medication/changed the following medications:  Refilled hydroxyzine 50 mg every 6 hours Refilled Lamictal 50 mg daily  My Chart Access: [email or web address]  Please follow-up as needed  Please make sure to arrive 15 minutes prior to your next appointment. If you arrive late, you may be asked to reschedule.    We look forward to seeing you next time. Please call our clinic at [phone] if you have any questions or concerns. The best time to call is Monday-Friday from 9am-4pm, but there is someone available 24/7. If after hours or the weekend, call the main hospital number and ask for the Internal Medicine Resident On-Call. If you need medication refills, please notify your pharmacy one week in advance and they will send [name] a request.   Thank you for letting us take part in your care. Wishing you the best!  Steffanie Rainwater, MD 08/23/2021, 2:36 PM IM Resident, PGY-2 [place] Heck 41:10

## 2021-08-26 ENCOUNTER — Encounter: Payer: Self-pay | Admitting: Obstetrics & Gynecology

## 2021-08-26 ENCOUNTER — Ambulatory Visit: Payer: 59 | Admitting: Behavioral Health

## 2021-08-26 ENCOUNTER — Ambulatory Visit (INDEPENDENT_AMBULATORY_CARE_PROVIDER_SITE_OTHER): Payer: 59 | Admitting: Obstetrics & Gynecology

## 2021-08-26 ENCOUNTER — Ambulatory Visit (INDEPENDENT_AMBULATORY_CARE_PROVIDER_SITE_OTHER): Payer: 59

## 2021-08-26 VITALS — BP 100/63 | HR 64 | Ht 63.0 in | Wt 172.0 lb

## 2021-08-26 DIAGNOSIS — N816 Rectocele: Secondary | ICD-10-CM | POA: Diagnosis not present

## 2021-08-26 DIAGNOSIS — N920 Excessive and frequent menstruation with regular cycle: Secondary | ICD-10-CM

## 2021-08-26 DIAGNOSIS — N852 Hypertrophy of uterus: Secondary | ICD-10-CM

## 2021-08-26 DIAGNOSIS — F331 Major depressive disorder, recurrent, moderate: Secondary | ICD-10-CM

## 2021-08-26 DIAGNOSIS — F419 Anxiety disorder, unspecified: Secondary | ICD-10-CM

## 2021-08-26 DIAGNOSIS — K5909 Other constipation: Secondary | ICD-10-CM

## 2021-08-26 MED ORDER — MEGESTROL ACETATE 40 MG PO TABS: 40.0000 mg | ORAL_TABLET | Freq: Every day | ORAL | 11 refills | Status: DC

## 2021-08-26 MED ORDER — MEGESTROL ACETATE 40 MG PO TABS: ORAL_TABLET | ORAL | 0 refills | Status: DC

## 2021-08-26 NOTE — Addendum Note (Signed)
Addended by: Earl Lagos on: 08/26/2021 01:50 PM   Modules accepted: Level of Service

## 2021-08-26 NOTE — Progress Notes (Signed)
PELVIC US TA/TV: homogeneous anteverted uterus,WNL,EEC 17.3 mm,normal ovaries,ovaries appear mobile,no free fluid,no pain during ultrasound  Chaperone Peggy

## 2021-08-26 NOTE — BH Specialist Note (Signed)
Integrated Behavioral Health via Telemedicine Visit  08/26/2021 Barbara Simon [id number]  Number of Integrated Behavioral Health Clinician visits: 5 Session Start time: 1415 Session End time: 1445 Total time in minutes: 30 min  Referring Provider: Dr. Huntley Dec, MD Patient/Family location: Pt is in SIL's yard in private [facility] Provider location: [facility] Office All persons participating in visit: Pt & Clinician Types of Service: Individual psychotherapy  I connected with Barbara Simon and [name] and/or [name]	[name]  self  via  Telephone or Engineer, civil (consulting)  (Video is Surveyor, mining) and verified that I am speaking with the correct person using two identifiers. Discussed confidentiality: Yes   I discussed the limitations of telemedicine and the availability of in person appointments.  Discussed there is a possibility of technology failure and discussed alternative modes of communication if that failure occurs.  I discussed that engaging in this telemedicine visit, they consent to the provision of behavioral healthcare and the services will be billed under their insurance.  Patient and/or legal guardian expressed understanding and consented to Telemedicine visit: Yes   Presenting Concerns: Patient and/or family reports the following symptoms/concerns: Pt has her episodes of Non-epileptic Sz activity Duration of problem: months; Severity of problem: moderate  Patient and/or Family's Strengths/Protective Factors: Social and Emotional competence, Concrete supports in place (healthy food, safe environments, etc.), Sense of purpose, and Physical Health (exercise, healthy diet, medication compliance, etc.)  Goals Addressed: Patient will:  Reduce symptoms of: anxiety, depression, and stress   Increase knowledge and/or ability of: coping skills and stress reduction   Demonstrate ability to: Increase healthy adjustment to current life circumstances  Progress towards  Goals: Ongoing & transferred to [facility]  Interventions: Interventions utilized:  Solution-Focused Strategies, Medication Monitoring, and Supportive Counseling Standardized Assessments completed:  screeners prn  Patient and/or Family Response: Pt is receptive to call today.  Assessment: Patient currently experiencing inc in mental health wellbeing.  Pt is getting married this Sat. & happy about it!  Patient may benefit from full Psychiatric Eval & cont'd bimonthly psychotherapy @ [facility].  Plan: Follow up with behavioral health clinician on : Pt has been transferred to [facility] Behavioral recommendations: None today-Enjoy Saturday & Congratulations! Referral(s):  None today  I discussed the assessment and treatment plan with the patient and/or parent/guardian. They were provided an opportunity to ask questions and all were answered. They agreed with the plan and demonstrated an understanding of the instructions.   They were advised to call back or seek an in-person evaluation if the symptoms worsen or if the condition fails to improve as anticipated.  [name], LMFT

## 2021-08-26 NOTE — Progress Notes (Signed)
Internal Medicine Clinic Attending  Case discussed with Dr. Amponsah  At the time of the visit.  We reviewed the resident's history and exam and pertinent patient test results.  I agree with the assessment, diagnosis, and plan of care documented in the resident's note.  

## 2021-08-26 NOTE — Progress Notes (Signed)
Follow up appointment for results: sonogram  Chief Complaint  Patient presents with   Follow-up    [name] today    Blood pressure 100/63, pulse 64, height 5\' 3"  (1.6 m), weight 172 lb (78 kg).  PELVIS (TRANSABDOMINAL ONLY)  Result Date: 08/26/2021 Images from the original result were not included.  ..an 10/26/2021 of Ultrasound Medicine Financial trader) accredited practice [facility] [address] [place] Ordering Provider: [id number], MD                                                                                     GYNECOLOGIC SONOGRAM Barbara Simon is a 46 y.o. [phone] No LMP recorded. She is here for a pelvic sonogram for menorrhagia,enlarged uterus. LMP 2 weeks ago Uterus                      7.82 x 5.5 x 6.7 cm, Total uterine volume 152 cc, homogeneous anteverted uterus,WNL Endometrium          17.3 mm, symmetrical, wnl Right ovary             2.5 x 2.1 x 2.6 cm, normal Left ovary                2 x 1.2 x 1.7 cm, normal No free fluid Technician Comments: PELVIC [id number] TA/TV: homogeneous anteverted uterus,WNL,EEC 17.3 mm,normal ovaries,ovaries appear mobile,no free fluid,no pain during ultrasound Chaperone [address] 08/26/2021 9:15 AM Clinical Impression and recommendations: I have reviewed the sonogram results above, combined with the patient's current clinical course, below are my impressions and any appropriate recommendations for management based on the sonographic findings. Uterus 152 cc is upper limits normal for age and parity, normal contour and morphology Endometrium appropriate for secretory phase in a menstruating woman Ovaries: both are normal size shape and contour 10/26/2021 08/26/2021 9:26 AM  10/26/2021 PELVIS TRANSVAGINAL NON-OB (TV ONLY)  Result Date: 08/26/2021 Images from the original result were not included.  ..an 10/26/2021 of Ultrasound Medicine Financial trader) accredited practice [facility] [address] [place] Ordering Provider: [id number], MD                                                                                     GYNECOLOGIC SONOGRAM Barbara Simon is a 46 y.o. [phone] No LMP recorded. She is here for a pelvic sonogram for menorrhagia,enlarged uterus. LMP 2 weeks ago Uterus                      7.82 x 5.5 x 6.7 cm, Total uterine volume 152 cc, homogeneous anteverted uterus,WNL Endometrium          17.3 mm, symmetrical,  wnl Right ovary             2.5 x 2.1 x 2.6 cm, normal Left ovary                2 x 1.2 x 1.7 cm, normal No free fluid Technician Comments: PELVIC US TA/TV: homogeneous anteverted uterus,WNL,EEC 17.3 mm,normal ovaries,ovaries appear mobile,no free fluid,no pain during ultrasound Chaperone [address] Flora Lipps 08/26/2021 9:15 AM Clinical Impression and recommendations: I have reviewed the sonogram results above, combined with the patient's current clinical course, below are my impressions and any appropriate recommendations for management based on the sonographic findings. Uterus 152 cc is upper limits normal for age and parity, normal contour and morphology Endometrium appropriate for secretory phase in a menstruating woman Ovaries: both are normal size shape and contour Barbara Simon 08/26/2021 9:26 AM     MEDS ordered this encounter: Meds ordered this encounter  Medications   megestrol (MEGACE) 40 MG tablet    Sig: 3 tablets a day for 5 days, 2 tablets a day for 5 days then 1 tablet daily    Dispense:  45 tablet    Refill:  0   megestrol (MEGACE) 40 MG tablet    Sig: Take 1 tablet (40 mg total) by mouth daily. For 24 days, then off for 4 days.  Repeat    Dispense:  24 tablet    Refill:  11    Orders for this encounter: No orders of the defined types were placed in this encounter.   Impression + Management Plan   ICD-10-CM   1. Menorrhagia with regular cycle  N92.0    Megestrol synch then cycle    2.  Constipation, chronic, resolved on miralax  K59.09     3. Rectocele, Grade 3  N81.6    hopefully will improve with resolution of constipation      Follow Up: Return in about 4 months (around 12/26/2021) for Follow up, with Dr Despina Hidden.     All questions were answered.  Past Medical History:  Diagnosis Date   Abnormal Pap smear    LSIL   Anxiety    Bipolar 1 disorder (HCC)    Depression    Hepatitis C    two years ago was dx   Ovarian cyst    Thyrotoxicosis     Past Surgical History:  Procedure Laterality Date   ABDOMINAL SURGERY     APPENDECTOMY     TUBAL LIGATION      OB History     Gravida  3   Para  3   Term  3   Preterm      AB      Living  3      SAB      IAB      Ectopic      Multiple      Live Births  3           Allergies  Allergen Reactions   Toradol [Ketorolac Tromethamine] Hives and Itching   Adhesive [Tape] Itching and Rash    Social History   Socioeconomic History   Marital status: Married    Spouse name: Not on file   Number of children: Not on file   Years of education: Not on file   Highest education level: Not on file  Occupational History   Not on file  Tobacco Use   Smoking status: Every Day    Packs/day: 1.00    Types:  Cigarettes   Smokeless tobacco: Never   Tobacco comments:    1 PPD  Vaping Use   Vaping Use: Never used  Substance and Sexual Activity   Alcohol use: No   Drug use: No   Sexual activity: Never    Birth control/protection: None  Other Topics Concern   Not on file  Social History Narrative   Not on file   Social Determinants of Health   Financial Resource Strain: High Risk (07/16/2021)   Overall Financial Resource Strain (CARDIA)    Difficulty of Paying Living Expenses: Very hard  Food Insecurity: Food Insecurity Present (07/16/2021)   Hunger Vital Sign    Worried About Running Out of Food in the Last Year: Sometimes true    Ran Out of Food in the Last Year: Often true  Transportation  Needs: No Transportation Needs (07/16/2021)   PRAPARE - Administrator, Civil ServiceTransportation    Lack of Transportation (Medical): No    Lack of Transportation (Non-Medical): No  Physical Activity: Insufficiently Active (07/16/2021)   Exercise Vital Sign    Days of Exercise per Week: 2 days    Minutes of Exercise per Session: 20 min  Stress: Stress Concern Present (07/16/2021)   [employer] - Occupational Stress Questionnaire    Feeling of Stress : Very much  Social Connections: Socially Integrated (07/16/2021)   Social Connection and Isolation Panel [NHANES]    Frequency of Communication with Friends and Family: More than three times a week    Frequency of Social Gatherings with Friends and Family: More than three times a week    Attends Religious Services: More than 4 times per year    Active Member of [employer] FinancialClubs or Organizations: Yes    Attends Engineer, structuralClub or Organization Meetings: More than 4 times per year    Marital Status: Living with partner    Family History  Problem Relation Age of Onset   Cancer Maternal Grandmother    Cancer Maternal Aunt

## 2021-08-27 ENCOUNTER — Other Ambulatory Visit: Payer: Self-pay | Admitting: *Deleted

## 2021-08-27 MED ORDER — ALBUTEROL SULFATE HFA 108 (90 BASE) MCG/ACT IN AERS: 2.0000 | INHALATION_SPRAY | Freq: Four times a day (QID) | RESPIRATORY_TRACT | 2 refills | Status: DC | PRN

## 2021-09-06 ENCOUNTER — Telehealth: Payer: Self-pay | Admitting: Student

## 2021-09-06 DIAGNOSIS — G6289 Other specified polyneuropathies: Secondary | ICD-10-CM

## 2021-09-06 DIAGNOSIS — F1190 Opioid use, unspecified, uncomplicated: Secondary | ICD-10-CM

## 2021-09-06 MED ORDER — BUPRENORPHINE HCL-NALOXONE HCL 8-2 MG SL FILM: 3.0000 | ORAL_FILM | Freq: Three times a day (TID) | SUBLINGUAL | 0 refills | Status: DC

## 2021-09-06 MED ORDER — BUPRENORPHINE HCL-NALOXONE HCL 8-2 MG SL SUBL: 1.0000 | SUBLINGUAL_TABLET | Freq: Every day | SUBLINGUAL | 0 refills | Status: DC

## 2021-09-06 MED ORDER — BUPRENORPHINE HCL-NALOXONE HCL 8-2 MG SL SUBL: 1.0000 | SUBLINGUAL_TABLET | Freq: Three times a day (TID) | SUBLINGUAL | 0 refills | Status: DC

## 2021-09-06 MED ORDER — GABAPENTIN 300 MG PO CAPS: 900.0000 mg | ORAL_CAPSULE | Freq: Three times a day (TID) | ORAL | 2 refills | Status: DC

## 2021-09-07 MED ORDER — BUPRENORPHINE HCL-NALOXONE HCL 8-2 MG SL FILM: 1.0000 | ORAL_FILM | Freq: Three times a day (TID) | SUBLINGUAL | 0 refills | Status: AC

## 2021-09-09 ENCOUNTER — Other Ambulatory Visit: Payer: Self-pay

## 2021-09-09 DIAGNOSIS — F1190 Opioid use, unspecified, uncomplicated: Secondary | ICD-10-CM

## 2021-09-09 MED ORDER — CITALOPRAM HYDROBROMIDE 40 MG PO TABS: 40.0000 mg | ORAL_TABLET | Freq: Every day | ORAL | 2 refills | Status: DC

## 2021-09-09 MED ORDER — PANTOPRAZOLE SODIUM 40 MG PO TBEC
40.0000 mg | DELAYED_RELEASE_TABLET | Freq: Every day | ORAL | 3 refills | Status: DC
Start: 2021-09-09 — End: 2022-01-17

## 2021-09-19 ENCOUNTER — Ambulatory Visit (INDEPENDENT_AMBULATORY_CARE_PROVIDER_SITE_OTHER): Payer: 59 | Admitting: Student

## 2021-09-19 VITALS — BP 107/65 | HR 76 | Temp 98.0°F | Ht 63.0 in | Wt 175.5 lb

## 2021-09-19 DIAGNOSIS — L309 Dermatitis, unspecified: Secondary | ICD-10-CM | POA: Insufficient documentation

## 2021-09-19 DIAGNOSIS — L308 Other specified dermatitis: Secondary | ICD-10-CM

## 2021-09-19 DIAGNOSIS — M7989 Other specified soft tissue disorders: Secondary | ICD-10-CM

## 2021-09-19 DIAGNOSIS — B370 Candidal stomatitis: Secondary | ICD-10-CM | POA: Diagnosis not present

## 2021-09-19 DIAGNOSIS — F332 Major depressive disorder, recurrent severe without psychotic features: Secondary | ICD-10-CM | POA: Diagnosis not present

## 2021-09-19 MED ORDER — HYDROCORTISONE 2.5 % EX LOTN: TOPICAL_LOTION | CUTANEOUS | 1 refills | Status: AC

## 2021-09-19 MED ORDER — TRIPLE ANTIBIOTIC 3.5-400-5000 EX OINT: 1.0000 | TOPICAL_OINTMENT | Freq: Every day | CUTANEOUS | 0 refills | Status: AC

## 2021-09-19 MED ORDER — NYSTATIN 100000 UNIT/ML MT SUSP: 5.0000 mL | Freq: Four times a day (QID) | OROMUCOSAL | 0 refills | Status: AC

## 2021-09-19 MED ORDER — CALAMINE EX LOTN: 1.0000 | TOPICAL_LOTION | CUTANEOUS | 0 refills | Status: DC | PRN

## 2021-09-19 NOTE — Assessment & Plan Note (Signed)
Assessment: Patient with history of rash that occurs are her buttock only. The rash will itch and after itching it will become painful. It lasts a few days and then resolves. She has had this for many years. Multiple rasied lesions on right buttock and left gluteal cleft. The rash is not located elsewhere, she does note being told she had eczema as a child. Because of the rash being bilateral, low suspican for shingles. The rash being in one location and not being vesicular on initiatin, low suspician this is herpes.   Will treat as if infected eczematous rash with antibiotic ointment and calamine lotion.  Once healed instructed patient to use steroid cream to see if this helps with additional itching. Patient with no improvement consider infectious etiology  Plan: -Antibiotic ointment for 5 days and calamine lotion -Steroid cream thereafter -Return precautions given.  If no improvement consider infectious etiology

## 2021-09-19 NOTE — Assessment & Plan Note (Signed)
Assessment: Recent abx use, has noticed tongue pain since. On exam has white coating to posterior aspect of tongue consistent with thrush. Denies throat pain or trouble swallowing. Will order nystatin solution for 7 days.   Plan: -nystatin solution swish and spit, 7 days
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
## 2021-09-19 NOTE — Assessment & Plan Note (Signed)
Elevated PHQ-9 14. Currently on seroquel and celexa. She was referred to counseling but never followed up. Will place another referral and continue medication regimen.
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
## 2021-09-23 NOTE — Progress Notes (Signed)
Internal Medicine Clinic Attending  Case discussed with Dr. Katsadouros  At the time of the visit.  We reviewed the resident's history and exam and pertinent patient test results.  I agree with the assessment, diagnosis, and plan of care documented in the resident's note.  

## 2021-10-16 ENCOUNTER — Ambulatory Visit (INDEPENDENT_AMBULATORY_CARE_PROVIDER_SITE_OTHER): Payer: 59 | Admitting: Student

## 2021-10-16 DIAGNOSIS — F1190 Opioid use, unspecified, uncomplicated: Secondary | ICD-10-CM | POA: Diagnosis not present

## 2021-10-16 DIAGNOSIS — R569 Unspecified convulsions: Secondary | ICD-10-CM

## 2021-10-16 MED ORDER — BUPRENORPHINE HCL-NALOXONE HCL 8-2 MG SL FILM: 1.0000 | ORAL_FILM | Freq: Three times a day (TID) | SUBLINGUAL | 0 refills | Status: AC

## 2021-10-16 MED ORDER — METHOCARBAMOL 500 MG PO TABS: 1500.0000 mg | ORAL_TABLET | Freq: Three times a day (TID) | ORAL | 0 refills | Status: AC | PRN
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
## 2021-10-18 NOTE — Assessment & Plan Note (Signed)
Continues to have episodes about once a week. Reports an episode earlier today and did not feel safe to ride husband's motor cycle to office today. She does not have a car. Was unable to meet with [facility] provider due to concern for her having a seizure in office. She reports she is meeting with a psychologist currently and is working on scheduling with a new psychiatry provider, but could not remember the name. Is having some MSK pain from shaking episodes and reports robaxin worked well in the past. She is continuing hydroxyzine and lalmictal which are helping although she feels sx are worse lately due to more stress.   Plan Continue hydroxyzine and lamictal  Robaxin 1.5g q8 prn for 3 days Patient to establish with psychiatry, she will call if having difficulties with finding a provider

## 2021-10-18 NOTE — Progress Notes (Signed)
  [facility] Internal Medicine Residency Telephone Encounter Continuity Care Appointment  HPI:  This telephone encounter was created for [name] on 10/18/2021 for the following purpose/cc OUD follow up.   Past Medical History:  Past Medical History:  Diagnosis Date   Abnormal Pap smear    LSIL   Anxiety    Bipolar 1 disorder (HCC)    Depression    Hepatitis C    two years ago was dx   Ovarian cyst    Thyrotoxicosis      ROS:  Negative as HPI   Assessment / Plan / Recommendations:  Please see A&P under problem oriented charting for assessment of the patient's acute and chronic medical conditions.  As always, pt is advised that if symptoms worsen or new symptoms arise, they should go to an urgent care facility or to to ER for further evaluation.   Consent and Medical Decision Making:  Patient discussed with Dr. Cleda Daub This is a telephone encounter between [facility] and Barbara Simon on 10/18/2021 for OUD follow up. The visit was conducted with the patient located at home and Barbara Simon at [facility]. The patient's identity was confirmed using their DOB and current address. The patient has consented to being evaluated through a telephone encounter and understands the associated risks (an examination cannot be done and the patient may need to come in for an appointment) / benefits (allows the patient to remain at home, decreasing exposure to coronavirus). I personally spent 24 minutes on medical discussion.

## 2021-10-24 NOTE — Progress Notes (Signed)
Internal Medicine Clinic Attending  Case discussed with the resident at the time of the visit.  We reviewed the resident's history and exam and pertinent patient test results.  I agree with the assessment, diagnosis, and plan of care documented in the resident's note.  

## 2021-11-28 ENCOUNTER — Telehealth: Payer: Self-pay

## 2021-11-28 DIAGNOSIS — F1190 Opioid use, unspecified, uncomplicated: Secondary | ICD-10-CM

## 2021-11-28 NOTE — Telephone Encounter (Signed)
Pt states she cannot get suboxone, it cost $200.00 please call pt back.

## 2021-11-29 ENCOUNTER — Other Ambulatory Visit (HOSPITAL_COMMUNITY): Payer: Self-pay

## 2021-11-29 MED ORDER — BUPRENORPHINE HCL-NALOXONE HCL 8-2 MG SL SUBL
1.0000 | SUBLINGUAL_TABLET | Freq: Three times a day (TID) | SUBLINGUAL | 0 refills | Status: DC
  Filled 2021-11-29: qty 42, 14d supply, fill #0

## 2021-11-29 NOTE — Telephone Encounter (Signed)
Call to [facility] to discuss patient getting her Suboxone under the IM program.   Patient's insurance lapsed on 11/14/2021.  Patient  will be able to get 14 days worth of the pills at a time for $25. Test run was done under Dr. Shirley Friar as an Attending- Dr. Elaina Pattee will not be able to write as she is not an Attending.  The film is not covered under the IM program.  Attempts to contact patient.  Message left to call the Clinics.

## 2021-11-29 NOTE — Telephone Encounter (Signed)
RTC to patient sister-in-law answered phone to give patient message to call the Clinics as soon as possible to make an appointment.

## 2021-11-29 NOTE — Telephone Encounter (Signed)
Unfortunately, coupon is only for 14 films. Patient uses 1 film TID.

## 2021-11-29 NOTE — Telephone Encounter (Signed)
[facility] has coupon for suboxone films for $38. Call placed to patient. No answer. Left message on VM requesting return call.

## 2021-11-29 NOTE — Telephone Encounter (Signed)
Patient returned call. States she last p/u suboxone in July at [facility] in [place]. States she and husband have no money and didn't realize insurance had lapsed. States they have been sharing their films. She is using only 1.5-2 films per day to try to save money. She is asking for Rx to [facility] under IM Program. She understands that they only have tabs. She is aware that she is 2 weeks overdue for in person visit. First available given on 10/5 at 1015.

## 2021-12-02 ENCOUNTER — Other Ambulatory Visit (HOSPITAL_COMMUNITY): Payer: Self-pay

## 2021-12-09 ENCOUNTER — Other Ambulatory Visit (HOSPITAL_COMMUNITY): Payer: Self-pay

## 2021-12-19 ENCOUNTER — Other Ambulatory Visit: Payer: Self-pay | Admitting: *Deleted

## 2021-12-19 ENCOUNTER — Other Ambulatory Visit (HOSPITAL_COMMUNITY): Payer: Self-pay

## 2021-12-19 ENCOUNTER — Ambulatory Visit (INDEPENDENT_AMBULATORY_CARE_PROVIDER_SITE_OTHER): Payer: Self-pay | Admitting: Internal Medicine

## 2021-12-19 ENCOUNTER — Encounter: Payer: Self-pay | Admitting: Internal Medicine

## 2021-12-19 ENCOUNTER — Other Ambulatory Visit: Payer: Self-pay

## 2021-12-19 VITALS — BP 105/74 | HR 78 | Temp 98.3°F | Ht 63.0 in | Wt 169.9 lb

## 2021-12-19 DIAGNOSIS — G6289 Other specified polyneuropathies: Secondary | ICD-10-CM

## 2021-12-19 DIAGNOSIS — Z23 Encounter for immunization: Secondary | ICD-10-CM

## 2021-12-19 DIAGNOSIS — F1190 Opioid use, unspecified, uncomplicated: Secondary | ICD-10-CM

## 2021-12-19 MED ORDER — HYDROXYZINE HCL 50 MG PO TABS
50.0000 mg | ORAL_TABLET | Freq: Four times a day (QID) | ORAL | 3 refills | Status: DC
  Filled 2021-12-19: qty 90, 23d supply, fill #0

## 2021-12-19 MED ORDER — BUPRENORPHINE HCL-NALOXONE HCL 8-2 MG SL SUBL
1.0000 | SUBLINGUAL_TABLET | Freq: Three times a day (TID) | SUBLINGUAL | 0 refills | Status: DC
  Filled 2021-12-19: qty 42, 14d supply, fill #0

## 2021-12-19 NOTE — Patient Instructions (Signed)
Dear Mrs. Barbara Simon,  Thank you for trusting [name] with your care today.   I have refilled your suboxone and hydroxyzine.   Please return in 1 month for a OUD follow up.

## 2021-12-19 NOTE — Progress Notes (Signed)
   CC: oud  HPI:[name] is a 46 y.o. female who presents for evaluation of OUD. Please see individual problem based A/P for details.  Presenting for OUD follow up. She has been taking Suboxone 8-2 TID. RReports that it has been working well for her. 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   Depression, PHQ-9: Based on the patients  [place] Visit from 12/19/2021 in [place]  PHQ-9 Total Score 16      score we have 16.  Past Medical History:  Diagnosis Date   Abnormal Pap smear    LSIL   Anxiety    Bipolar 1 disorder ([place])    Depression    Hepatitis C    two years ago was dx   Ovarian cyst    Thyrotoxicosis    Review of Systems:   See HPI  Physical Exam: Vitals:   12/19/21 1032 12/19/21 1036  BP: 107/73 105/74  Pulse: 71 78  Temp: 98.3 F (36.8 C)   TempSrc: Oral   SpO2: 97%   Weight: 169 lb 14.4 oz (77.1 kg)   Height: 5\' 3"  (1.6 m)      General: NAD HEENT: Conjunctiva nl , antiicteric sclerae, moist mucous membranes, no exudate or erythema Cardiovascular: Normal rate, regular rhythm.  No murmurs, rubs, or gallops Pulmonary : Equal breath sounds, No wheezes, rales, or rhonchi Abdominal: soft, nontender,  bowel sounds present Ext: No edema in lower extremities, no tenderness to palpation of lower extremities.   Assessment & Plan:   See Encounters Tab for problem based charting.  OUD maintained on suboxone.  - refill 2 week supply - UDS - 1 month follow up  Patient discussed with Dr. Dareen Piano

## 2021-12-19 NOTE — Telephone Encounter (Signed)
Orders were sent to [facility] through IM program already. thanks

## 2021-12-19 NOTE — Telephone Encounter (Signed)
Patient would like to get Gabapentin through the IM program as she does not have insurance.

## 2021-12-21 ENCOUNTER — Encounter: Payer: Self-pay | Admitting: Internal Medicine

## 2021-12-21 NOTE — Assessment & Plan Note (Addendum)
Presenting for OUD follow up. She has been taking Suboxone 8-2 TID. RReports that it has been working well for her. 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    OUD maintained on suboxone.  - refill 2 week supply - UDS - 1 month follow up

## 2021-12-23 LAB — TOXASSURE SELECT,+ANTIDEPR,UR

## 2021-12-23 NOTE — Progress Notes (Signed)
Internal Medicine Clinic Attending ° °Case discussed with Dr. [name]  At the time of the visit.  We reviewed the resident’s history and exam and pertinent patient test results.  I agree with the assessment, diagnosis, and plan of care documented in the resident’s note.  °

## 2021-12-25 ENCOUNTER — Encounter: Payer: Self-pay | Admitting: Internal Medicine

## 2021-12-27 ENCOUNTER — Other Ambulatory Visit (HOSPITAL_COMMUNITY): Payer: Self-pay

## 2022-01-02 ENCOUNTER — Other Ambulatory Visit (HOSPITAL_COMMUNITY): Payer: Self-pay

## 2022-01-02 ENCOUNTER — Other Ambulatory Visit: Payer: Self-pay

## 2022-01-02 DIAGNOSIS — G6289 Other specified polyneuropathies: Secondary | ICD-10-CM

## 2022-01-02 MED ORDER — GABAPENTIN 300 MG PO CAPS
900.0000 mg | ORAL_CAPSULE | Freq: Three times a day (TID) | ORAL | 2 refills | Status: DC
  Filled 2022-01-02: qty 180, 20d supply, fill #0

## 2022-01-17 ENCOUNTER — Other Ambulatory Visit: Payer: Self-pay

## 2022-01-17 ENCOUNTER — Ambulatory Visit (INDEPENDENT_AMBULATORY_CARE_PROVIDER_SITE_OTHER): Payer: Self-pay

## 2022-01-17 VITALS — BP 105/74 | HR 79 | Temp 98.3°F | Ht 63.0 in | Wt 176.9 lb

## 2022-01-17 DIAGNOSIS — F332 Major depressive disorder, recurrent severe without psychotic features: Secondary | ICD-10-CM

## 2022-01-17 DIAGNOSIS — M24411 Recurrent dislocation, right shoulder: Secondary | ICD-10-CM | POA: Insufficient documentation

## 2022-01-17 DIAGNOSIS — M62838 Other muscle spasm: Secondary | ICD-10-CM

## 2022-01-17 DIAGNOSIS — J4530 Mild persistent asthma, uncomplicated: Secondary | ICD-10-CM

## 2022-01-17 DIAGNOSIS — F1190 Opioid use, unspecified, uncomplicated: Secondary | ICD-10-CM

## 2022-01-17 DIAGNOSIS — R1013 Epigastric pain: Secondary | ICD-10-CM

## 2022-01-17 MED ORDER — PANTOPRAZOLE SODIUM 40 MG PO TBEC: 40.0000 mg | DELAYED_RELEASE_TABLET | Freq: Every day | ORAL | 3 refills | Status: DC

## 2022-01-17 MED ORDER — DICLOFENAC SODIUM 1 % EX GEL: 2.0000 g | Freq: Four times a day (QID) | CUTANEOUS | 0 refills | Status: DC

## 2022-01-17 MED ORDER — FLUTICASONE PROPIONATE HFA 44 MCG/ACT IN AERO: 1.0000 | INHALATION_SPRAY | Freq: Two times a day (BID) | RESPIRATORY_TRACT | 1 refills | Status: DC

## 2022-01-17 MED ORDER — CITALOPRAM HYDROBROMIDE 40 MG PO TABS: 40.0000 mg | ORAL_TABLET | Freq: Every day | ORAL | 2 refills | Status: DC

## 2022-01-17 MED ORDER — BUPRENORPHINE HCL-NALOXONE HCL 8-2 MG SL SUBL: 1.0000 | SUBLINGUAL_TABLET | Freq: Three times a day (TID) | SUBLINGUAL | 1 refills | Status: DC

## 2022-01-17 MED ORDER — ALBUTEROL SULFATE HFA 108 (90 BASE) MCG/ACT IN AERS: 2.0000 | INHALATION_SPRAY | Freq: Four times a day (QID) | RESPIRATORY_TRACT | 2 refills | Status: DC | PRN

## 2022-01-17 NOTE — Assessment & Plan Note (Addendum)
Patient reports multiple episodes of shoulder dislocation over the past year. No episodes recently but has diffuse anterior, posterior, lateral pain. At one point months ago said she couldn't move the arm due to pain and had to hold it like a splint. She cannot lay on the arm and says it will pop out of place if she supports herself on it. There is no atrophy and no loss of active or passive range of motion. No joint deformity or crepitus noted with movement. She has 5/5 strength to abduction, adduction, flexion, extension, internal and external rotation. There is some pain on empty can testing. Offered the patient orthopedic referral to decide on imaging and possible surgical options. Could consider ultrasound and injection if no improvement with rest, ice/heat, voltaren gel.

## 2022-01-17 NOTE — Assessment & Plan Note (Signed)
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 No tightness or PTP on exam, subjectively not hurting at the moment but does 99% of the time. She says she uses tiger balm and deep blue cream daily. She does not get relief from tylenol or ibuprofen.Encouraged heat/ice, minimizing positions and movements that cause pain, voltaren gel and massage. Will not prescribe muscle relaxer at this time, can consider in the future if no improvement. -voltaren gel

## 2022-01-17 NOTE — Assessment & Plan Note (Addendum)
Patient says she has not had her albuterol or fluticasone inhalers in some time. She says if she is not sick she uses albuterol once daily with no night time awakenings. When she has URI symptoms she requires albuterol 3 times daily. On exam she has some diminished airflow at the bases, stray inspiratory wheezing and minimal end expiratory wheezing at the bases. Expect thi to improve with routine use of inhalers. Will consider ICS -LABA if no improvement at next visit.

## 2022-01-17 NOTE — Progress Notes (Deleted)
OUD OUD maintained on suboxone. Tox assure as expected 12/2021 with THC present. - refill 2 week supply - UDS - 1 month follow up Suboxone 8-2 TID  BP II  Peripheral neuropathy Neurontin 300 TID  PNES Continues to have episodes about once a week. Reports an episode earlier today and did not feel safe to ride husband's motor cycle to office today. She does not have a car. Was unable to meet with [facility] provider due to concern for her having a seizure in office. She reports she is meeting with a psychologist currently and is working on scheduling with a new psychiatry provider, but could not remember the name. Is having some MSK pain from shaking episodes and reports robaxin worked well in the past. She is continuing hydroxyzine and lalmictal which are helping although she feels sx are worse lately due to more stress.    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

## 2022-01-17 NOTE — Patient Instructions (Addendum)
Thank you, Barbara Simon for allowing [name] to provide your care today. Today we discussed :  R shoulder pain- Recurrent dislocations can cause pain and tear of structures in the shoulder. We can try tylenol, ibuprofen and voltaren gel and trying to rest that area with ice/heat. Definitive treatment would be surgery. We can refer you to the orthopedic surgeons for imaging and assessment when you feel ready.  Suboxone- I have refilled this for you. We will see you in one month.  Asthma- I have refilled your inhalers.  Neck pain- Try heat/ice and voltaren gel. Self massage or massage from a partner is helpful.   1-800-quit-now   I have ordered the following medication/changed the following medications:   Stop the following medications: Medications Discontinued During This Encounter  Medication Reason   fluticasone (FLOVENT HFA) 44 MCG/ACT inhaler Reorder   pantoprazole (PROTONIX) 40 MG tablet Reorder   buprenorphine-naloxone (SUBOXONE) 8-2 mg SUBL SL tablet Reorder   albuterol (VENTOLIN HFA) 108 (90 Base) MCG/ACT inhaler Reorder   citalopram (CELEXA) 40 MG tablet Reorder     Start the following medications: Meds ordered this encounter  Medications   fluticasone (FLOVENT HFA) 44 MCG/ACT inhaler    Sig: Inhale 1 puff into the lungs in the morning and at bedtime. Rinse your mouth with water after each use.    Dispense:  1 each    Refill:  1   buprenorphine-naloxone (SUBOXONE) 8-2 mg SUBL SL tablet    Sig: Place 1 tablet under the tongue 3 (three) times daily for 28 days.    Dispense:  42 tablet    Refill:  1    IM program   pantoprazole (PROTONIX) 40 MG tablet    Sig: Take 1 tablet (40 mg total) by mouth daily.    Dispense:  90 tablet    Refill:  3   diclofenac Sodium (VOLTAREN) 1 % GEL    Sig: Apply 2 g topically 4 (four) times daily.    Dispense:  2 g    Refill:  0   albuterol (VENTOLIN HFA) 108 (90 Base) MCG/ACT inhaler    Sig: Inhale 2 puffs into the lungs every 6 (six)  hours as needed for wheezing or shortness of breath.    Dispense:  8 g    Refill:  2   citalopram (CELEXA) 40 MG tablet    Sig: Take 1 tablet (40 mg total) by mouth daily.    Dispense:  30 tablet    Refill:  2     Follow up:  1 month      We look forward to seeing you next time. Please call our clinic at [phone] if you have any questions or concerns. The best time to call is Monday-Friday from 9am-4pm, but there is someone available 24/7. If after hours or the weekend, call the main hospital number and ask for the Internal Medicine Resident On-Call. If you need medication refills, please notify your pharmacy one week in advance and they will send [name] a request.   Thank you for trusting me with your care. Wishing you the best!   Iona Coach, MD [facility]

## 2022-01-17 NOTE — Progress Notes (Signed)
Established Patient Office Visit  Subjective   Patient ID: Barbara Simon, female    DOB: May 31, 1975  Age: 46 y.o. MRN: [id number]  Chief Complaint  Patient presents with   Medication Refill    Suboxone.   Nasal Congestion    And pressure.    [name] is a 46 y/o female with a pmh outlined who presents for OUD visit. Please see encounter tab for HPI and A/P information.  Medication Refill      Review of Systems  All other systems reviewed and are negative.     Objective:     BP 105/74 (BP Location: Left Arm, Patient Position: Sitting, Cuff Size: Normal)   Pulse 79   Temp 98.3 F (36.8 C) (Oral)   Ht 5\' 3"  (1.6 m)   Wt 176 lb 14.4 oz (80.2 kg)   SpO2 98% Comment: RA  BMI 31.34 kg/m    Physical Exam Constitutional:      General: She is not in acute distress.    Appearance: Normal appearance. She is obese.  Cardiovascular:     Rate and Rhythm: Normal rate and regular rhythm.     Pulses: Normal pulses.     Heart sounds: Normal heart sounds. No murmur heard.    No gallop.  Pulmonary:     Effort: Pulmonary effort is normal. No respiratory distress.     Breath sounds: No rales.     Comments: Stray inspiratory wheezes, end expiratory wheezes at the bases, reduced airflow at the bases Musculoskeletal:     Right lower leg: No edema.     Left lower leg: No edema.     Comments: No pain to palpation over the left sternocleidomastoid muscle and no muscle tension or spasm noted. R shoulder with full active and passive range of motion, 5/5 strength in all directions, no atrophy, joint deformity or crepitus, pain with empty can test  Skin:    General: Skin is warm and dry.     Capillary Refill: Capillary refill takes less than 2 seconds.  Neurological:     Mental Status: She is alert.      No results found for any visits on 01/17/22.    The ASCVD Risk score (Arnett DK, et al., 2019) failed to calculate for the following reasons:   Cannot find a previous HDL  lab   Cannot find a previous total cholesterol lab    Assessment & Plan:   Problem List Items Addressed This Visit       Respiratory   Asthma - Primary (Chronic)    Patient says she has not had her albuterol or fluticasone inhalers in some time. She says if she is not sick she uses albuterol once daily with no night time awakenings. When she has URI symptoms she requires albuterol 3 times daily. On exam she has some diminished airflow at the bases, stray inspiratory wheezing and minimal end expiratory wheezing at the bases. Expect thi to improve with routine use of inhalers. Will consider ICS -LABA if no improvement at next visit.      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 No tightness or PTP on exam, subjectively not hurting at the moment but does 99% of the time. She says she uses  tiger balm and deep blue cream daily. She does not get relief from tylenol or ibuprofen.Encouraged heat/ice, minimizing positions and movements that cause pain, voltaren gel and massage. Will not prescribe muscle relaxer at this time, can consider in the future if no improvement. -voltaren gel      Relevant Medications   diclofenac Sodium (VOLTAREN) 1 % GEL   Shoulder dislocation, recurrent, right    Patient reports multiple episodes of shoulder dislocation over the past year. No episodes recently but has diffuse anterior, posterior, lateral pain. At one point months ago said she couldn't move the arm due to pain and had to hold it like a splint. She cannot lay on the arm and says it will pop out of place if she supports herself on it. There is no atrophy and no loss of active or passive range of motion. No joint deformity or crepitus noted with movement. She has 5/5 strength to abduction,  adduction, flexion, extension, internal and external rotation. There is some pain on empty can testing. Offered the patient orthopedic referral to decide on imaging and possible surgical options. Could consider ultrasound and injection if no improvement with rest, ice/heat, voltaren gel.        Other   Opioid use disorder (Chronic)    Says TID dosing is working well. Denies cravings, withdrawal, opioid use other than saboxone. She seldom drinks and reports marijuana use. Last Toxassure 12/2021 expected with positive THC. No ToxAssure today. -Suboxone 8-2 TID filled for 14 days with 1 refill -1 month follow up       Relevant Medications   buprenorphine-naloxone (SUBOXONE) 8-2 mg SUBL SL tablet   Major depression   Relevant Medications   citalopram (CELEXA) 40 MG tablet   Epigastric discomfort with nausea and vomiting    No follow-ups on file.    Iona Coach, MD

## 2022-01-17 NOTE — Assessment & Plan Note (Signed)
Says TID dosing is working well. Denies cravings, withdrawal, opioid use other than saboxone. She seldom drinks and reports marijuana use. Last Toxassure 12/2021 expected with positive THC. No ToxAssure today. -Suboxone 8-2 TID filled for 14 days with 1 refill -1 month follow up

## 2022-01-18 NOTE — Progress Notes (Signed)
Internal Medicine Clinic Attending  I saw and evaluated the patient.  I personally confirmed the key portions of the history and exam documented by Dr. Rogers and I reviewed pertinent patient test results.  The assessment, diagnosis, and plan were formulated together and I agree with the documentation in the resident's note.  

## 2022-02-03 ENCOUNTER — Ambulatory Visit: Payer: BC Managed Care – PPO | Admitting: Adult Health

## 2022-02-03 ENCOUNTER — Other Ambulatory Visit (HOSPITAL_COMMUNITY)
Admission: RE | Admit: 2022-02-03 | Discharge: 2022-02-03 | Disposition: A | Discharge status: Home / Self Care | Payer: BC Managed Care – PPO | Source: Ambulatory Visit | Attending: Adult Health | Admitting: Adult Health

## 2022-02-03 ENCOUNTER — Other Ambulatory Visit (HOSPITAL_COMMUNITY): Payer: Self-pay

## 2022-02-03 ENCOUNTER — Encounter: Payer: Self-pay | Admitting: Adult Health

## 2022-02-03 VITALS — BP 105/72 | HR 66 | Ht 63.0 in | Wt 173.0 lb

## 2022-02-03 DIAGNOSIS — R109 Unspecified abdominal pain: Secondary | ICD-10-CM | POA: Diagnosis not present

## 2022-02-03 DIAGNOSIS — N8189 Other female genital prolapse: Secondary | ICD-10-CM | POA: Diagnosis not present

## 2022-02-03 DIAGNOSIS — Z124 Encounter for screening for malignant neoplasm of cervix: Secondary | ICD-10-CM | POA: Diagnosis not present

## 2022-02-03 DIAGNOSIS — R1031 Right lower quadrant pain: Secondary | ICD-10-CM | POA: Insufficient documentation

## 2022-02-03 LAB — POCT URINALYSIS DIPSTICK
Blood, UA: NEGATIVE
Glucose, UA: NEGATIVE
Ketones, UA: NEGATIVE
Leukocytes, UA: NEGATIVE
Nitrite, UA: NEGATIVE
Protein, UA: NEGATIVE

## 2022-02-03 NOTE — Progress Notes (Signed)
  Subjective:     Patient ID: Barbara Simon, female   DOB: [birth year], 46 y.o.   MRN: [id number]  HPI Barbara Simon is a 46 year old white female, married, G3P3, in complaining of pain right side for 3 weeks was worse Friday. Has not had much of appetite and not peeing often. And she needs a pap.  PCP is Dr Barbara Simon.  Review of Systems +pain ride side for w ees Decreased appetite and peeing Reviewed past medical,surgical, social and family history. Reviewed medications and allergies.     Objective:   Physical Exam BP 105/72 (BP Location: Left Arm, Patient Position: Sitting, Cuff Size: Normal)   Pulse 66   Ht 5\' 3"  (1.6 m)   Wt 173 lb (78.5 kg)   LMP 01/22/2022 (Approximate)   BMI 30.65 kg/m  urine dipstick was negative.   Skin warm and dry. +CVAT on right , abdomen is soft, some tenderness near RUQ Pelvic: external genitalia is normal in appearance no lesions, vagina: pink, has pelvic relaxation,urethra has no lesions or masses noted, cervix:smooth, pap  with HR HPV genotyping performed, uterus: normal size, shape and contour, mildly tender, no masses felt, adnexa: no masses, RLQ tenderness noted. Bladder is non tender and no masses felt. No pain with leg lifts. Fall risk is moderate.  [facility] - 02/03/22 1205       Pregnancy Intention Screening   Does the patient want to become pregnant in the next year? No    Does the patient's partner want to become pregnant in the next year? No    Would the patient like to discuss contraceptive options today? No      Contraception Wrap Up   Current Method Female Sterilization    End Method Female Sterilization    Contraception Counseling Provided No            Examination chaperoned by 02/05/22 LPN  Assessment:     1. Routine Papanicolaou smear Pap sent Pap in 3 years if normal  - Cytology - PAP( [name])  2. RLQ abdominal pain Will get pelvic Barbara Simon to assess RLQ pain - Barbara Simon PELVIC COMPLETE WITH TRANSVAGINAL; Future Can take  tylenol and ibuprofen prn pain  3. Right flank pain Will get Barbara Simon 11/21/ 23 at 7 am at [facility], will talk when results back  - 08-13-1996 RENAL; Future - POCT Urinalysis Dipstick Push fluids   4. Pelvic relaxation Will get Follow up with Dr Barbara Simon, she has seen him in past     Plan:     Follow up with Dr Despina Hidden in 2 weeks

## 2022-02-04 ENCOUNTER — Telehealth: Payer: Self-pay | Admitting: Adult Health

## 2022-02-04 ENCOUNTER — Ambulatory Visit (HOSPITAL_BASED_OUTPATIENT_CLINIC_OR_DEPARTMENT_OTHER)
Admission: RE | Admit: 2022-02-04 | Discharge: 2022-02-04 | Disposition: A | Discharge status: Home / Self Care | Payer: BC Managed Care – PPO | Source: Ambulatory Visit | Attending: Adult Health | Admitting: Adult Health

## 2022-02-04 DIAGNOSIS — R109 Unspecified abdominal pain: Secondary | ICD-10-CM | POA: Insufficient documentation

## 2022-02-04 DIAGNOSIS — R1031 Right lower quadrant pain: Secondary | ICD-10-CM | POA: Insufficient documentation

## 2022-02-04 NOTE — Telephone Encounter (Signed)
Pt aware that Pelvic US was normal

## 2022-02-05 ENCOUNTER — Telehealth: Payer: Self-pay | Admitting: *Deleted

## 2022-02-05 LAB — CYTOLOGY - PAP
Adequacy: ABSENT
Chlamydia: NEGATIVE
Comment: NEGATIVE
Comment: NEGATIVE
Comment: NORMAL
Diagnosis: NEGATIVE
Diagnosis: REACTIVE
High risk HPV: NEGATIVE
Neisseria Gonorrhea: NEGATIVE

## 2022-02-05 NOTE — Telephone Encounter (Signed)
Pt aware US showed no stone and to keep appt with Dr. Despina Hidden. Pt voiced understanding. JSY

## 2022-02-05 NOTE — Telephone Encounter (Signed)
-----   Message from Adline Potter, NP sent at 02/05/2022 10:54 AM EST ----- Please let her know US showed no stone. Keep appt with Dr Despina Hidden

## 2022-02-13 ENCOUNTER — Other Ambulatory Visit: Payer: Self-pay

## 2022-02-13 ENCOUNTER — Other Ambulatory Visit: Payer: Self-pay | Admitting: Internal Medicine

## 2022-02-13 DIAGNOSIS — F1190 Opioid use, unspecified, uncomplicated: Secondary | ICD-10-CM

## 2022-02-13 MED ORDER — BUPRENORPHINE HCL-NALOXONE HCL 8-2 MG SL SUBL: 1.0000 | SUBLINGUAL_TABLET | Freq: Three times a day (TID) | SUBLINGUAL | 0 refills | Status: AC

## 2022-02-17 ENCOUNTER — Ambulatory Visit: Payer: BC Managed Care – PPO | Admitting: Obstetrics & Gynecology

## 2022-02-17 ENCOUNTER — Other Ambulatory Visit (HOSPITAL_COMMUNITY): Payer: Self-pay

## 2022-02-17 ENCOUNTER — Encounter: Payer: Self-pay | Admitting: Student

## 2022-02-21 ENCOUNTER — Ambulatory Visit (INDEPENDENT_AMBULATORY_CARE_PROVIDER_SITE_OTHER): Payer: BC Managed Care – PPO | Admitting: Student

## 2022-02-21 VITALS — BP 97/70 | HR 63 | Temp 97.7°F | Ht 63.0 in | Wt 174.6 lb

## 2022-02-21 DIAGNOSIS — M5416 Radiculopathy, lumbar region: Secondary | ICD-10-CM

## 2022-02-21 DIAGNOSIS — F1190 Opioid use, unspecified, uncomplicated: Secondary | ICD-10-CM

## 2022-02-21 NOTE — Patient Instructions (Signed)
It was a pleasure seeing you in clinic today. Please continue you suboxone 802 mg three times daily  Please follow up in 1 month

## 2022-02-22 ENCOUNTER — Encounter: Payer: Self-pay | Admitting: Student

## 2022-02-22 DIAGNOSIS — M5416 Radiculopathy, lumbar region: Secondary | ICD-10-CM | POA: Insufficient documentation

## 2022-02-22 NOTE — Assessment & Plan Note (Signed)
Currently on suboxone three times daily. Last fill appears to be 01/21/2022. States she ran out and has been buying this off the street as she has had difficulty affording her prescriptions. Recently started new job and now with insurance. Feels she will now be able to afford medication regularly.  Discussed expectation that she does not obtain subxone from other sources. She understands. Unfortunately she resent used the restroom and needed to leave, so unable to provide urine sample today. Will continue subxone 8-2 mg three times daily. Will switch to 30 day scripts as she is now insured and using [facility] rather than [facility].  -suboxone 8-2 mg three times daily  -follow up in 1 month -UDS at next visit

## 2022-02-22 NOTE — Progress Notes (Signed)
Established Patient Office Visit  Subjective   Patient ID: Barbara Simon, female    DOB: 11-24-1975  Age: 46 y.o. MRN: [id number]  Chief Complaint  Patient presents with  . Follow-up    Barbara Simon is a 46 y.o. person living with a history listed below who presents to clinic for oud follow up. Please refer to problem based charting for further details and assessment and plan of current problem and chronic medical conditions.   Patient Active Problem List   Diagnosis Date Noted  . Left lumbar radiculopathy 02/22/2022  . Routine Papanicolaou smear 02/03/2022  . Right flank pain 02/03/2022  . RLQ abdominal pain 02/03/2022  . Pelvic relaxation 02/03/2022  . Shoulder dislocation, recurrent, right 01/17/2022  . Eczema 09/19/2021  . Swelling of lower extremity 09/19/2021  . Thrush 09/19/2021  . Headache 07/04/2021  . 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  . Syncope 07/03/2021  . Seizure-like activity (HCC) 07/03/2021  . Posterior vaginal wall prolapse 06/14/2021  . Healthcare maintenance 05/13/2021  . Neck muscle spasm 01/09/2021  . Epigastric discomfort with nausea and vomiting 01/09/2021  . History of colonic polyps 2019 01/08/2021  . Asthma 12/24/2020  . Tobacco use 11/20/2020  . Nasal polyp 11/20/2020  . Stress at home 09/25/2020  . Bipolar II disorder (HCC) 07/24/2020  . Peripheral neuropathy 07/24/2020  . Carpal tunnel syndrome, bilateral 07/24/2020  . Opioid use disorder 12/20/2015  . Cervical high risk HPV (human papillomavirus) test positive 03/29/2013  . History of hepatitis C 02/15/2013  . Major depression 02/03/2013      ROS: negative as per HPI    Objective:     BP 97/70 (BP Location: Right Arm, Patient Position: Sitting, Cuff Size: Normal)   Pulse 63   Temp 97.7 F (36.5 C) (Oral)   Ht 5\' 3"  (1.6 m)   Wt 174 lb 9.7 oz (79.2 kg)   LMP 01/22/2022 (Approximate)   SpO2 98%   BMI 30.93 kg/m  BP Readings from Last 3 Encounters:  02/21/22  97/70  02/03/22 105/72  01/17/22 105/74      Physical Exam Constitutional:      Appearance: Normal appearance.  HENT:     Head: Normocephalic and atraumatic.     Mouth/Throat:     Mouth: Mucous membranes are moist.     Pharynx: Oropharynx is clear.  Cardiovascular:     Rate and Rhythm: Normal rate and regular rhythm.  Pulmonary:     Effort: Pulmonary effort is normal.     Breath sounds: No rhonchi or rales.  Abdominal:     General: Abdomen is flat. Bowel sounds are normal. There is no distension.     Palpations: Abdomen is soft.     Tenderness: There is no abdominal tenderness.  Musculoskeletal:        General: Normal range of motion.     Right lower leg: No edema.     Left lower leg: No edema.     Comments: Ttp of the right lumbar area, no midline tenderness of step offs  Skin:    General: Skin is warm and dry.     Capillary Refill: Capillary refill takes less than 2 seconds.  Neurological:     General: No focal deficit present.     Mental Status: She is alert and oriented to person, place, and time.  Psychiatric:        Mood and Affect: Mood normal.        Behavior: Behavior normal.  No results found for any visits on 02/21/22.     The ASCVD Risk score (Arnett DK, et al., 2019) failed to calculate for the following reasons:   Cannot find a previous HDL lab   Cannot find a previous total cholesterol lab    Assessment & Plan:   Problem List Items Addressed This Visit       Nervous and Auditory   Left lumbar radiculopathy - Primary    Pain of the left lumbar area radiating to the foot.Started this morning after having a full shift last night. 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 Using tylenol for pain. No red flag symptoms. Will treat conservatively. Has had multiple episodes of this. She will all if pain is not improving.         Other   Opioid use disorder (Chronic)    Currently on suboxone three times daily. Last fill appears to  be 01/21/2022. States she ran out and has been buying this off the street as she has had difficulty affording her prescriptions. Recently started new job and now with insurance. Feels she will now be able to afford medication regularly.  Discussed expectation that she does not obtain subxone from other sources. She understands. Unfortunately she resent used the restroom and needed to leave, so unable to provide urine sample today. Will continue subxone 8-2 mg three times daily. Will switch to 30 day scripts as she is now insured and [facility] than [facility].  -suboxone 8-2 mg three times daily  -follow up in 1 month -UDS at next visit       Return in about 4 weeks (around 03/21/2022).    Quincy Simmonds, MD

## 2022-02-22 NOTE — Assessment & Plan Note (Signed)
Pain of the left lumbar area radiating to the foot.Started this morning after having a full shift last night. 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 Using tylenol for pain. No red flag symptoms. Will treat conservatively. Has had multiple episodes of this. She will all if pain is not improving.

## 2022-02-24 NOTE — Progress Notes (Signed)
Internal Medicine Clinic Attending  I saw and evaluated the patient.  I personally confirmed the key portions of the history and exam documented by Dr. Liang and I reviewed pertinent patient test results.  The assessment, diagnosis, and plan were formulated together and I agree with the documentation in the resident's note.  

## 2022-02-28 ENCOUNTER — Telehealth: Payer: Self-pay

## 2022-02-28 MED ORDER — BUPRENORPHINE HCL-NALOXONE HCL 8-2 MG SL SUBL: 1.0000 | SUBLINGUAL_TABLET | Freq: Three times a day (TID) | SUBLINGUAL | 0 refills | Status: AC

## 2022-02-28 NOTE — Telephone Encounter (Signed)
Placed order for suboxone 8-2 mg tablets 3 tablets daily [facility] [place]

## 2022-02-28 NOTE — Telephone Encounter (Signed)
Next appt scheduled 03/21/22 with Dr Montez Morita. Medication is not on current med list.

## 2022-02-28 NOTE — Telephone Encounter (Signed)
[facility]ing [facility] [place], [place].

## 2022-03-21 ENCOUNTER — Ambulatory Visit (INDEPENDENT_AMBULATORY_CARE_PROVIDER_SITE_OTHER): Payer: Self-pay | Admitting: Student

## 2022-03-21 DIAGNOSIS — Z0000 Encounter for general adult medical examination without abnormal findings: Secondary | ICD-10-CM

## 2022-03-24 NOTE — Progress Notes (Signed)
No show

## 2022-03-30 DIAGNOSIS — L0390 Cellulitis, unspecified: Secondary | ICD-10-CM | POA: Diagnosis not present

## 2022-03-30 DIAGNOSIS — B349 Viral infection, unspecified: Secondary | ICD-10-CM | POA: Diagnosis not present

## 2022-04-04 ENCOUNTER — Ambulatory Visit (INDEPENDENT_AMBULATORY_CARE_PROVIDER_SITE_OTHER): Payer: BC Managed Care – PPO | Admitting: Student

## 2022-04-04 DIAGNOSIS — F439 Reaction to severe stress, unspecified: Secondary | ICD-10-CM

## 2022-04-04 DIAGNOSIS — F1190 Opioid use, unspecified, uncomplicated: Secondary | ICD-10-CM | POA: Diagnosis not present

## 2022-04-04 MED ORDER — BUPRENORPHINE HCL-NALOXONE HCL 8-2 MG SL FILM: 1.0000 | ORAL_FILM | Freq: Three times a day (TID) | SUBLINGUAL | 0 refills | Status: AC

## 2022-04-06 NOTE — Assessment & Plan Note (Signed)
Refilled her suboxone 8-2mg  TID films. Patient will come in for toxassure. Will possibly extend refill to 3 months if next toxassure reassuring.  Sent referral to integrated behavioral health

## 2022-04-06 NOTE — Progress Notes (Signed)
  [facility] Internal Medicine Residency Telephone Encounter Continuity Care Appointment  HPI:  This telephone encounter was created for [name] on 04/06/2022 for the following purpose/cc OUD and suboxone refill. Patient states that she has struggled some over the last few weeks. She has had multiple stressors that have made it where her cravings are not always controlled with her suboxone. She states she has not used any opioids but she has drank more alcohol than usual. She is interested in counseling for this.      Past Medical History:  Past Medical History:  Diagnosis Date   Abnormal Pap smear    LSIL   Anxiety    Asthma    Bipolar 1 disorder ([place])    Depression    Hepatitis C    two years ago was dx   Ovarian cyst    Seizure ([place])    Thyrotoxicosis      ROS:  Negative except per above.    Assessment / Plan / Recommendations:  Please see A&P under problem oriented charting for assessment of the patient's acute and chronic medical conditions.  As always, pt is advised that if symptoms worsen or new symptoms arise, they should go to an urgent care facility or to to ER for further evaluation.   Consent and Medical Decision Making:  Patient discussed with Dr.  Cain Sieve This is a telephone encounter between [name] and Rick Duff on 04/06/2022 for OUD, suboxone refill. The visit was conducted with the patient located at home and Rick Duff at [facility]. The patient's identity was confirmed using their DOB and current address. The patient has consented to being evaluated through a telephone encounter and understands the associated risks (an examination cannot be done and the patient may need to come in for an appointment) / benefits (allows the patient to remain at home, decreasing exposure to coronavirus). I personally spent 20 minutes on medical discussion.

## 2022-04-07 NOTE — Progress Notes (Signed)
Internal Medicine Clinic Attending  Case discussed with Dr. Carter  At the time of the visit.  We reviewed the resident's history and exam and pertinent patient test results.  I agree with the assessment, diagnosis, and plan of care documented in the resident's note.  

## 2022-04-14 ENCOUNTER — Encounter: Payer: Self-pay | Admitting: Internal Medicine

## 2022-04-14 ENCOUNTER — Ambulatory Visit (INDEPENDENT_AMBULATORY_CARE_PROVIDER_SITE_OTHER): Payer: BC Managed Care – PPO

## 2022-04-14 ENCOUNTER — Other Ambulatory Visit: Payer: Self-pay

## 2022-04-14 VITALS — BP 106/69 | HR 78 | Temp 98.1°F | Resp 32 | Ht 63.0 in | Wt 165.5 lb

## 2022-04-14 DIAGNOSIS — Z79899 Other long term (current) drug therapy: Secondary | ICD-10-CM | POA: Diagnosis not present

## 2022-04-14 DIAGNOSIS — S20161D Insect bite (nonvenomous) of breast, right breast, subsequent encounter: Secondary | ICD-10-CM | POA: Diagnosis not present

## 2022-04-14 DIAGNOSIS — R1110 Vomiting, unspecified: Secondary | ICD-10-CM | POA: Insufficient documentation

## 2022-04-14 DIAGNOSIS — S20161A Insect bite (nonvenomous) of breast, right breast, initial encounter: Secondary | ICD-10-CM

## 2022-04-14 DIAGNOSIS — W57XXXD Bitten or stung by nonvenomous insect and other nonvenomous arthropods, subsequent encounter: Secondary | ICD-10-CM | POA: Diagnosis not present

## 2022-04-14 DIAGNOSIS — F1190 Opioid use, unspecified, uncomplicated: Secondary | ICD-10-CM | POA: Diagnosis not present

## 2022-04-14 DIAGNOSIS — R112 Nausea with vomiting, unspecified: Secondary | ICD-10-CM

## 2022-04-14 DIAGNOSIS — W57XXXA Bitten or stung by nonvenomous insect and other nonvenomous arthropods, initial encounter: Secondary | ICD-10-CM | POA: Insufficient documentation

## 2022-04-14 MED ORDER — METOCLOPRAMIDE HCL 10 MG PO TABS: 10.0000 mg | ORAL_TABLET | Freq: Four times a day (QID) | ORAL | 0 refills | Status: DC

## 2022-04-14 NOTE — Assessment & Plan Note (Signed)
Current medications include suboxone 8-2 mg TID. Refilled during phone interview on 1/21. Patient reports that suboxone typically controls her cravings.   Plan: - Continue suboxone 8-2 mg TID - Toxassure today

## 2022-04-14 NOTE — Patient Instructions (Addendum)
Thank you for coming to see [name] in clinic Barbara Simon.   Plan:  - Please start taking Reglan for your nausea and vomiting  - Please try over the counter mucinex for your cough and nasal congestion   - Please try over the counter neosporin for your bug bite (if you notice worsening redness, swelling, bleeding, or drainage, please call our office so that we can schedule an appointment to see you)  - We got your urine today regarding your suboxone use (we will call you with these results)   It was very nice to see you, thank you for allowing [name] to be involved in your care.

## 2022-04-14 NOTE — Progress Notes (Signed)
CC: urgent care f/u visit  HPI:  [name] is a 47 y.o. female with past medical history of Asthma, Hep C, Chiari Malformation type 1, lumbar radiculopathy, opioid use disorder, depression, and bipolar 2 that presents for an urgent care f/u visit.   Patient reports developing fever, chills, nasal congestion, mild intermittent productive cough, nausea, vomiting, intermittent diffuse  abdominal pain over the last 2.5 weeks. Patient reports being seen by urgent care 2 weeks ago where she was prescribed an antibiotic. Patient is unsure of which antibiotic this was but has finished the course. She reports no symptom improvement. She was tested for covid and influenza at the urgent care which she reports were negative. She has not eaten much in the last 3 days due to the vomiting. Patient has also been taking zofran without relief.   Patient reports being bit by an insect on her upper chest 2.5 weeks ago. She is unsure of what bit her. She states that the redness has gone down a lot since the initial bite and has not noticed any bleeding or drainage. She has tried hydrocortisone cream on her bite which seems to be helping.  Patient has a history of opioid use disorder. Current medications include suboxone 8-2 mg TID. Refilled during phone interview on 1/21. Patient reports that suboxone typically controls her cravings.   Allergies as of 04/14/2022       Reactions   Toradol [ketorolac Tromethamine] Hives, Itching   Adhesive [tape] Itching, Rash        Medication List        Accurate as of April 14, 2022 11:00 AM. If you have any questions, ask your nurse or doctor.          acetaminophen 500 MG tablet Commonly known as: TYLENOL Take 500 mg by mouth every 6 (six) hours as needed for moderate pain.   albuterol 108 (90 Base) MCG/ACT inhaler Commonly known as: VENTOLIN HFA Inhale 2 puffs into the lungs every 6 (six) hours as needed for wheezing or shortness of breath.    Buprenorphine HCl-Naloxone HCl 8-2 MG Film Commonly known as: Suboxone Place 1 Film under the tongue 3 (three) times daily.   calamine lotion Apply 1 Application topically as needed for itching.   diclofenac Sodium 1 % Gel Commonly known as: Voltaren Apply 2 g topically 4 (four) times daily.   fluticasone 44 MCG/ACT inhaler Commonly known as: FLOVENT HFA Inhale 1 puff into the lungs in the morning and at bedtime. Rinse your mouth with water after each use.   gabapentin 300 MG capsule Commonly known as: NEURONTIN Take 3 capsules (900 mg total) by mouth 3 (three) times daily.   GOODY HEADACHE PO Take by mouth.   hydrocortisone 2.5 % lotion Apply to affected area 2 times daily for active flairs.   hydrOXYzine 50 MG tablet Commonly known as: ATARAX Take 1 tablet (50 mg total) by mouth every 6 (six) hours.   ibuprofen 200 MG tablet Commonly known as: ADVIL Take 800 mg by mouth as needed.   pantoprazole 40 MG tablet Commonly known as: Protonix Take 1 tablet (40 mg total) by mouth daily.   polyethylene glycol powder 17 GM/SCOOP powder Commonly known as: GLYCOLAX/MIRALAX 1-4 scoops daily or as needed         Past Medical History:  Diagnosis Date   Abnormal Pap smear    LSIL   Anxiety    Asthma    Bipolar 1 disorder ([place])  Depression    Hepatitis C    two years ago was dx   Ovarian cyst    Seizure ([place])    Thyrotoxicosis    Review of Systems:  per HPI.   Physical Exam: Vitals:   04/14/22 1039  BP: 106/69  Pulse: 78  Resp: (!) 32  Temp: 98.1 F (36.7 C)  TempSrc: Oral  SpO2: 99%  Weight: 165 lb 8 oz (75.1 kg)  Height: 5\' 3"  (1.6 m)   Constitutional: Well-developed, well-nourished, appears comfortable, actively coughing HENT: Normocephalic and atraumatic. Dry mucous membranes.  Eyes: EOM are normal. PERRL.  Neck: Normal range of motion.  Cardiovascular: Regular rate, regular rhythm. No murmurs, rubs, or gallops. Normal radial and PT pulses  bilaterally. No LE edema.  Pulmonary: Normal respiratory effort. No wheezes, rales, or rhonchi. No crackles.  Abdominal: Soft. Non-distended. No tenderness. Normal bowel sounds.  Musculoskeletal: Normal range of motion.     Neurological: Alert and oriented to person, place, and time. Non-focal. Skin: warm and dry. 3-4 one centimeter areas of induration on the right upper breast with minimal surrounding erythema. No swelling, active bleeding, or drainage.   Assessment & Plan:   See Encounters Tab for problem based charting.  Patient seen with Dr. Cain Sieve.

## 2022-04-14 NOTE — Assessment & Plan Note (Signed)
Patient reports developing fever, chills, nasal congestion, mild intermittent productive cough, nausea, vomiting, intermittent diffuse  abdominal pain over the last 2.5 weeks. Patient reports being seen by urgent care 2 weeks ago where she was prescribed an antibiotic. Patient is unsure of which antibiotic this was but has finished the course. She reports no symptom improvement. She was tested for covid and influenza at the urgent care which she reports were negative. She has not eaten much in the last 3 days due to the vomiting. Patient has also been taking zofran without relief. No wheezing, crackles, or abdominal tenderness on exam. She does have dry mucous membranes. Afebrile today. I suspect that the patients symptoms are likely secondary to a viral URI. Will treat with supportive care at this time.   Plan: - Prescribed reglan for vomiting (w/ GoodRx coupon) - OTC mucinex for cough and nasal congestion - Encouraged increased oral hydration

## 2022-04-14 NOTE — Assessment & Plan Note (Signed)
Patient reports being bit by an insect on her upper chest 2.5 weeks ago. She is unsure of what bit her. She states that the redness has gone down a lot since the initial bite and has not noticed any bleeding or drainage. She has tried hydrocortisone cream on her bite which seems to be helping. On exam, patient has 3-4 one centimeter areas of induration on the right upper breast with minimal surrounding erythema. No swelling, active bleeding, or drainage. Will treat with supportive care at this time.   Plan: - Continue conservative management at home (can add OTC neosporin as needed)

## 2022-04-17 LAB — TOXASSURE SELECT,+ANTIDEPR,UR

## 2022-04-17 NOTE — Progress Notes (Signed)
Internal Medicine Clinic Attending  Case discussed with Dr.  Alton Revere   At the time of the visit.  We reviewed the resident's history and exam and pertinent patient test results.  I agree with the assessment, diagnosis, and plan of care documented in the resident's note.     Will do a short course of Reglan in the setting of nausea & vomiting with her recent illness. This should not be a long-term medicine for her without further workup. I think her nausea & vomiting will improve as she recovers from her acute illness.

## 2022-04-17 NOTE — Progress Notes (Signed)
Toxassure is appropriate with the exception of Carboxy-THC.

## 2022-04-25 ENCOUNTER — Encounter: Payer: Self-pay | Admitting: Licensed Clinical Social Worker

## 2022-04-28 ENCOUNTER — Encounter: Payer: Self-pay | Admitting: Adult Health

## 2022-04-28 ENCOUNTER — Ambulatory Visit: Payer: BC Managed Care – PPO | Admitting: Adult Health

## 2022-04-28 VITALS — BP 96/64 | HR 78 | Ht 63.0 in | Wt 170.0 lb

## 2022-04-28 DIAGNOSIS — N926 Irregular menstruation, unspecified: Secondary | ICD-10-CM | POA: Insufficient documentation

## 2022-04-28 DIAGNOSIS — Z3202 Encounter for pregnancy test, result negative: Secondary | ICD-10-CM | POA: Diagnosis not present

## 2022-04-28 DIAGNOSIS — R52 Pain, unspecified: Secondary | ICD-10-CM

## 2022-04-28 DIAGNOSIS — N8189 Other female genital prolapse: Secondary | ICD-10-CM | POA: Diagnosis not present

## 2022-04-28 DIAGNOSIS — R232 Flushing: Secondary | ICD-10-CM

## 2022-04-28 DIAGNOSIS — N816 Rectocele: Secondary | ICD-10-CM | POA: Diagnosis not present

## 2022-04-28 DIAGNOSIS — R5383 Other fatigue: Secondary | ICD-10-CM | POA: Diagnosis not present

## 2022-04-28 DIAGNOSIS — N951 Menopausal and female climacteric states: Secondary | ICD-10-CM

## 2022-04-28 LAB — POCT URINE PREGNANCY: Preg Test, Ur: NEGATIVE

## 2022-04-28 NOTE — Progress Notes (Signed)
  Subjective:     Patient ID: Barbara Simon, female   DOB: Dec 23, 1975, 47 y.o.   MRN: [id number]  HPI Barbara Simon is a 47 year old white female, married, G3P3003, in with numerous complaints, no period since September, tired,body aches, and hot flashes.  Last pap was negative HPV and NILM 02/03/22.  PCP is Dr Lisabeth Devoid.  Review of Systems No period since September +tired +body aches +hot flashes Reviewed past medical,surgical, social and family history. Reviewed medications and allergies.     Objective:   Physical Exam BP 96/64 (BP Location: Left Arm, Patient Position: Sitting, Cuff Size: Normal)   Pulse 78   Ht 5\' 3"  (1.6 m)   Wt 170 lb (77.1 kg)   LMP 11/15/2021   BMI 30.11 kg/m   UPT is negative.   Skin warm and dry.Pelvic: external genitalia is normal in appearance no lesions, vagina: pale pink,urethra has no lesions or masses noted, cervix:smooth and bulbous, uterus: normal size, shape and contour, non tender, no masses felt, +pelvic relaxation, and rectocele,adnexa: no masses or tenderness noted. Bladder is non tender and no masses felt. Examination chaperoned by Levy Pupa LPN   [place] - 46/96/29 0955       Pregnancy Intention Screening   Does the patient want to become pregnant in the next year? No    Does the patient's partner want to become pregnant in the next year? No    Would the patient like to discuss contraceptive options today? No      Contraception Wrap Up   Current Method Female Sterilization    End Method Female Sterilization             Assessment:     1. Pregnancy examination or test, negative result - POCT urine pregnancy  2. Missed periods No periods since September 23 Will check labs  - TSH + free T4 - Follicle stimulating hormone  3. Hot flashes +hot flashes esp at night - Follicle stimulating hormone  4. Pelvic relaxation +relaxation  5. Rectocele, Grade 3 +rectocele  6. Tired +tired Will check labs  - CBC - TSH + free  T4 - Comprehensive metabolic panel  7. Body aches - Comprehensive metabolic panel  8. Perimenopause Review handouts on perimenopause and menopause Did discussed HRT some    Plan:     Follow up prn, TBD

## 2022-04-29 LAB — COMPREHENSIVE METABOLIC PANEL
ALT: 13 IU/L (ref 0–32)
AST: 21 IU/L (ref 0–40)
Albumin/Globulin Ratio: 2.4 — ABNORMAL HIGH (ref 1.2–2.2)
Albumin: 4.3 g/dL (ref 3.9–4.9)
Alkaline Phosphatase: 55 IU/L (ref 44–121)
BUN/Creatinine Ratio: 13 (ref 9–23)
BUN: 16 mg/dL (ref 6–24)
Bilirubin Total: <0.2 mg/dL (ref 0.0–1.2)
CO2: 23 mmol/L (ref 20–29)
Calcium: 9.2 mg/dL (ref 8.7–10.2)
Chloride: 103 mmol/L (ref 96–106)
Creatinine, Ser: 1.2 mg/dL — ABNORMAL HIGH (ref 0.57–1.00)
Globulin, Total: 1.8 g/dL (ref 1.5–4.5)
Glucose: 78 mg/dL (ref 70–99)
Potassium: 4.1 mmol/L (ref 3.5–5.2)
Sodium: 139 mmol/L (ref 134–144)
Total Protein: 6.1 g/dL (ref 6.0–8.5)
eGFR: 57 mL/min/{1.73_m2} — ABNORMAL LOW (ref >59)

## 2022-04-29 LAB — FOLLICLE STIMULATING HORMONE: FSH: 65.2 m[IU]/mL

## 2022-04-29 LAB — TSH+FREE T4
Free T4: 0.84 ng/dL (ref 0.82–1.77)
TSH: 0.94 u[IU]/mL (ref 0.450–4.500)

## 2022-04-29 LAB — CBC
Hematocrit: 37.4 % (ref 34.0–46.6)
Hemoglobin: 12.9 g/dL (ref 11.1–15.9)
MCH: 29.5 pg (ref 26.6–33.0)
MCHC: 34.5 g/dL (ref 31.5–35.7)
MCV: 86 fL (ref 79–97)
Platelets: 300 10*3/uL (ref 150–450)
RBC: 4.37 x10E6/uL (ref 3.77–5.28)
RDW: 14.4 % (ref 11.7–15.4)
WBC: 6.8 10*3/uL (ref 3.4–10.8)

## 2022-05-05 ENCOUNTER — Other Ambulatory Visit: Payer: Self-pay

## 2022-05-05 ENCOUNTER — Telehealth: Payer: Self-pay | Admitting: Adult Health

## 2022-05-05 ENCOUNTER — Other Ambulatory Visit (INDEPENDENT_AMBULATORY_CARE_PROVIDER_SITE_OTHER): Payer: BC Managed Care – PPO | Admitting: Internal Medicine

## 2022-05-05 DIAGNOSIS — F1190 Opioid use, unspecified, uncomplicated: Secondary | ICD-10-CM

## 2022-05-05 MED ORDER — BUPRENORPHINE HCL-NALOXONE HCL 8-2 MG SL SUBL: 1.0000 | SUBLINGUAL_TABLET | Freq: Three times a day (TID) | SUBLINGUAL | 0 refills | Status: DC

## 2022-05-05 NOTE — Telephone Encounter (Addendum)
Refill done.  

## 2022-05-05 NOTE — Telephone Encounter (Signed)
Pt wants to talk to you regarding her results. Does she need med since she is in postmenopausal range? Please call pt. Thanks! [place]

## 2022-05-05 NOTE — Progress Notes (Signed)
Patient requesting refill of suboxone. On chart review she received Suboxone TID. Last UDS was 1/24 and was appropriate. I am not able to view PDMP on Epic or online. From chart review, she has been getting regular RX of suboxone at same dose. P: Sent suboxone 8-2 mg TID to [facility] at [facility] in [place]

## 2022-05-05 NOTE — Telephone Encounter (Signed)
Patient would like a call  concerning her labs. Please advise.

## 2022-05-06 NOTE — Telephone Encounter (Signed)
Left message I returned her call. Can call me back

## 2022-05-06 NOTE — Addendum Note (Signed)
Addended by: Kerin Perna on: 05/06/2022 04:13 PM   Modules accepted: Orders

## 2022-05-06 NOTE — Telephone Encounter (Signed)
Patient called she stated she called the pharmacy and they told her they don't have a rx for her, are you able to resend rx?

## 2022-05-07 ENCOUNTER — Other Ambulatory Visit: Payer: Self-pay

## 2022-05-07 DIAGNOSIS — F1190 Opioid use, unspecified, uncomplicated: Secondary | ICD-10-CM

## 2022-05-07 MED ORDER — BUPRENORPHINE HCL-NALOXONE HCL 8-2 MG SL SUBL: 1.0000 | SUBLINGUAL_TABLET | Freq: Three times a day (TID) | SUBLINGUAL | 0 refills | Status: DC

## 2022-05-13 ENCOUNTER — Ambulatory Visit (INDEPENDENT_AMBULATORY_CARE_PROVIDER_SITE_OTHER): Payer: Self-pay | Admitting: Licensed Clinical Social Worker

## 2022-05-13 DIAGNOSIS — F3181 Bipolar II disorder: Secondary | ICD-10-CM

## 2022-05-13 NOTE — BH Specialist Note (Signed)
Integrated Behavioral Health via Telemedicine Visit  05/13/2022 Dyonna Sleeth [place] FM:[id number]  Number of [place] Clinician visits: 1- Initial Visit  Session Start time: [id number]   Session End time: 1000  Total time in minutes: 30   Referring Provider: Angelica Pou Patient/Family location: Home [facility] Provider location: Home All persons participating in visit: Self Types of Service: Telephone visit  I connected with [place]  via  Telephone  and verified that I am speaking with the correct person using two identifiers. Discussed confidentiality: Yes   I discussed the limitations of telemedicine and the availability of in person appointments.  Discussed there is a possibility of technology failure and discussed alternative modes of communication if that failure occurs.   Patient and/or legal guardian expressed understanding and consented to Telemedicine visit: Yes   Presenting Concerns: Patient and/or family reports the following symptoms/concerns: Anxiety   Patient and/or Family's Strengths/Protective Factors: Concrete supports in place (healthy food, safe environments, etc.)  Goals Addressed: Patient will:  Reduce symptoms of: anxiety   Progress towards Goals: Ongoing  Interventions: Interventions utilized:  Motivational Interviewing and Supportive Counseling Standardized Assessments completed: PHQ-SADS   Assessment: Howard introduced self to patient and gave patient [facility] contact information. [facility] reviewed confidentiality. Patient advised she understood. Patient advised she prefers Video visit or telephone visits. [employer] and patient completed consult. Patient is scheduled for 03/12 at 9:30 am.   Patient may benefit from Motivational Interviewing and Supportive Counseling.  Plan: Follow up with behavioral health clinician on : 03/06  I discussed the assessment and treatment plan with the patient and/or parent/guardian. They were provided an  opportunity to ask questions and all were answered. They agreed with the plan and demonstrated an understanding of the instructions.   They were advised to call back or seek an in-person evaluation if the symptoms worsen or if the condition fails to improve as anticipated. Milus Height, MSW, [place]  [facility] Center Direct Dial:[phone]  Fax [phone] Main Office Phone: [phone] [address] [place], [place] [address] Website: [place], [place]

## 2022-05-15 ENCOUNTER — Encounter: Payer: Self-pay | Admitting: Radiology

## 2022-05-27 ENCOUNTER — Other Ambulatory Visit: Payer: Self-pay

## 2022-05-27 ENCOUNTER — Ambulatory Visit (INDEPENDENT_AMBULATORY_CARE_PROVIDER_SITE_OTHER): Payer: Self-pay | Admitting: Licensed Clinical Social Worker

## 2022-05-27 DIAGNOSIS — F332 Major depressive disorder, recurrent severe without psychotic features: Secondary | ICD-10-CM

## 2022-05-27 DIAGNOSIS — J4530 Mild persistent asthma, uncomplicated: Secondary | ICD-10-CM

## 2022-05-27 NOTE — BH Specialist Note (Signed)
Patient no-showed today's appointment; appointment was for VIDEO visit at 9:30am  [address] [facility] from here on out)  logged into video visit. Patient never arrived. Patient will need to reschedule appointment by calling Internal medicine center [phone].  Milus Height, MSW, [place]  [facility] Direct Dial:[phone]  Fax [phone] Main Office Phone: [phone] [address] [place], [place] [address] Website: [place], [place]

## 2022-06-06 DIAGNOSIS — Z6828 Body mass index (BMI) 28.0-28.9, adult: Secondary | ICD-10-CM | POA: Diagnosis not present

## 2022-06-06 DIAGNOSIS — J45901 Unspecified asthma with (acute) exacerbation: Secondary | ICD-10-CM | POA: Diagnosis not present

## 2022-06-06 DIAGNOSIS — E663 Overweight: Secondary | ICD-10-CM | POA: Diagnosis not present

## 2022-06-10 ENCOUNTER — Other Ambulatory Visit: Payer: Self-pay | Admitting: Student

## 2022-06-10 ENCOUNTER — Other Ambulatory Visit: Payer: Self-pay | Admitting: Internal Medicine

## 2022-06-10 DIAGNOSIS — F1190 Opioid use, unspecified, uncomplicated: Secondary | ICD-10-CM

## 2022-06-20 ENCOUNTER — Ambulatory Visit (INDEPENDENT_AMBULATORY_CARE_PROVIDER_SITE_OTHER): Payer: BC Managed Care – PPO | Admitting: Student

## 2022-06-20 ENCOUNTER — Other Ambulatory Visit: Payer: Self-pay

## 2022-06-20 ENCOUNTER — Encounter: Payer: Self-pay | Admitting: Student

## 2022-06-20 VITALS — BP 109/70 | HR 84 | Temp 97.7°F | Ht 63.0 in | Wt 166.0 lb

## 2022-06-20 DIAGNOSIS — F1190 Opioid use, unspecified, uncomplicated: Secondary | ICD-10-CM

## 2022-06-20 DIAGNOSIS — R569 Unspecified convulsions: Secondary | ICD-10-CM

## 2022-06-20 DIAGNOSIS — F3181 Bipolar II disorder: Secondary | ICD-10-CM | POA: Diagnosis not present

## 2022-06-20 MED ORDER — TRAZODONE HCL 100 MG PO TABS: 100.0000 mg | ORAL_TABLET | Freq: Every day | ORAL | 2 refills | Status: DC

## 2022-06-20 NOTE — Patient Instructions (Addendum)
I am sorry you are feeling more stressed and anxious  Please start trazodone 50 mg daily before bed to help with sleep and anxiety You can increase this to 100 mg daily if needed  I recommend going to see the behavioral health urgent care given how bad your anxiety has gotten, you do not need an appointment and can walk in and they can help with medications I will also make a referral to psychiatry to help with this as well  Please stop hydroxyzine if it is causing significant dry mouth  Follow up in 2 months

## 2022-06-25 NOTE — Assessment & Plan Note (Signed)
Elevated PHQ-9 and GAD7 today. No longer on medication and not seeing counseling. She endorses increase anxiety, inability to sleep, and increased irritability. She may be developing hypomanic symptoms. Her THC use may also be contributing to this. She is uninterested in stopping this. No longer taking Seroquel or lamictal. Would like to establish with psychiatry. Also urgered to follow up with [facility] as she can be evaluated sooner than waiting for an appointment. She will also make an appointment with [facility] for counseling as she missed her last appointment.

## 2022-06-25 NOTE — Assessment & Plan Note (Signed)
Doing well on current dose of suboxone 8.2 mg three times daily. Not having increased cravings or relapses. No constipations. Toxassure appropriate at last visit. Continue on current medications. Follow up in 1 months.

## 2022-06-25 NOTE — Progress Notes (Signed)
Internal Medicine Clinic Attending ? ?Case discussed with Dr. Liang  At the time of the visit.  We reviewed the resident?s history and exam and pertinent patient test results.  I agree with the assessment, diagnosis, and plan of care documented in the resident?s note. ? ?

## 2022-06-25 NOTE — Assessment & Plan Note (Addendum)
Having more episodes recently. States more stress due to difficulty sleeping due to working third shift for the past 6 months. Feels she cannot get any rest. She is anxious about episodes and driving. No longer taking lamictal as she did not like how it made her feel and she did not think it helped. Never established with psychiatry due to insurance. Missed her appointment with Marena Chancy. She will make a new appointment. Had an episode head shaking near end of visit. Similar to episodes at prior visit with suppress with distraction or concentration. I think her stress and anxiety are provoking increase if her episodes. Will try trazodone at bedtime to help with sleep and anxiety. She need follow up with psychiatry, new referral placed as she has new insurance. Gave her contact information to [facility].

## 2022-06-25 NOTE — Progress Notes (Signed)
Established Patient Office Visit  Subjective   Patient ID: Barbara Simon, female    DOB: 04/20/1975  Age: 47 y.o. MRN: [id number]  Chief Complaint  Patient presents with   Follow-up    REQUESTING REFERRAL FOR NEUROLOGY / REQUEST TO BE PLACED BACK ON HER MEDICATION/ PAIN # 5 - ALL OVER.    Barbara Simon is a 47 y.o. person living with a history listed below who presents to clinic for follow up of seizure like episodes. Please refer to problem based charting for further details and assessment and plan of current problem and chronic medical conditions.    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   Seizure-like activity 07/03/2021   Posterior vaginal wall prolapse 06/14/2021   Healthcare maintenance 05/13/2021   History of colonic polyps 2019 01/08/2021   Asthma 12/24/2020   Tobacco use 11/20/2020   Nasal polyp 11/20/2020   Stress at home 09/25/2020   Bipolar II disorder 07/24/2020   Peripheral neuropathy 07/24/2020   Carpal tunnel syndrome, bilateral 07/24/2020   Opioid use disorder 12/20/2015   Cervical high risk HPV (human papillomavirus) test positive 03/29/2013   History of hepatitis C 02/15/2013   Major depression 02/03/2013      Review of Systems  Constitutional:  Negative for chills and fever.  Neurological:  Negative for sensory change, focal weakness and loss of consciousness.  Psychiatric/Behavioral:  Negative for hallucinations and suicidal ideas. The patient is  nervous/anxious.   All other systems reviewed and are negative.     Objective:     BP 109/70 (BP Location: Left Arm, Patient Position: Sitting, Cuff Size: Normal)   Pulse 84   Temp 97.7 F (36.5 C) (Oral)   Ht 5\' 3"  (1.6 m)   Wt 166 lb (75.3 kg)   BMI 29.41 kg/m  BP Readings from Last 3 Encounters:  06/20/22 109/70  04/28/22 96/64  04/14/22 106/69      Physical Exam Constitutional:      General: She is not in acute distress.    Appearance: Normal appearance.  HENT:     Head: Normocephalic and atraumatic.     Mouth/Throat:     Mouth: Mucous membranes are moist.     Pharynx: Oropharynx is clear.     Comments: No tongue bite Eyes:     Extraocular Movements: Extraocular movements intact.     Conjunctiva/sclera: Conjunctivae normal.     Pupils: Pupils are equal, round, and reactive to light.  Cardiovascular:     Rate and Rhythm: Normal rate and regular rhythm.     Pulses: Normal pulses.  Pulmonary:     Effort: Pulmonary effort is normal.     Breath sounds: No rhonchi or rales.  Abdominal:     General: Abdomen is flat. Bowel sounds are normal. There is no distension.     Palpations: Abdomen is soft.     Tenderness: There is no abdominal tenderness.  Musculoskeletal:        General: Normal range of motion.  Cervical back: Normal range of motion.     Right lower leg: No edema.     Left lower leg: No edema.  Skin:    General: Skin is warm and dry.     Capillary Refill: Capillary refill takes less than 2 seconds.  Neurological:     General: No focal deficit present.     Mental Status: She is alert and oriented to person, place, and time.     Sensory: No sensory deficit.     Motor: No weakness.     Gait: Gait normal.     Comments: Episodes of head shaking up and down that diminishes intermittently when distracted  Psychiatric:        Mood and Affect: Mood normal.        Behavior: Behavior normal.     No results found for any visits on 06/20/22.    The  ASCVD Risk score (Arnett DK, et al., 2019) failed to calculate for the following reasons:   Cannot find a previous HDL lab   Cannot find a previous total cholesterol lab    Assessment & Plan:   Problem List Items Addressed This Visit       Other   Opioid use disorder (Chronic)    Doing well on current dose of suboxone 8.2 mg three times daily. Not having increased cravings or relapses. No constipations. Toxassure appropriate at last visit. Continue on current medications. Follow up in 1 months.      Bipolar II disorder - Primary    Elevated PHQ-9 and GAD7 today. No longer on medication and not seeing counseling. She endorses increase anxiety, inability to sleep, and increased irritability. She may be developing hypomanic symptoms. Her THC use may also be contributing to this. She is uninterested in stopping this. No longer taking Seroquel or lamictal. Would like to establish with psychiatry. Also urgered to follow up with [facility] as she can be evaluated sooner than waiting for an appointment. She will also make an appointment with [facility] for counseling as she missed her last appointment.       Relevant Orders   Ambulatory referral to Psychiatry   Seizure-like activity    Having more episodes recently. States more stress due to difficulty sleeping due to working third shift for the past 6 months. Feels she cannot get any rest. She is anxious about episodes and driving. No longer taking lamictal as she did not like how it made her feel and she did not think it helped. Never established with psychiatry due to insurance. Missed her appointment with Marena Chancy. She will make a new appointment. Had an episode head shaking near end of visit. Similar to episodes at prior visit with suppress with distraction or concentration. I think her stress and anxiety are provoking increase if her episodes. Will try trazodone at bedtime to help with sleep and anxiety. She need follow up with psychiatry, new referral  placed as she has new insurance. Gave her contact information to [facility].       No follow-ups on file.    Quincy Simmonds, MD

## 2022-07-15 ENCOUNTER — Other Ambulatory Visit: Payer: Self-pay | Admitting: Student

## 2022-07-18 ENCOUNTER — Ambulatory Visit (HOSPITAL_COMMUNITY)
Admission: EM | Admit: 2022-07-18 | Discharge: 2022-07-18 | Disposition: A | Discharge status: Home / Self Care | Payer: BC Managed Care – PPO

## 2022-07-18 ENCOUNTER — Other Ambulatory Visit: Payer: Self-pay | Admitting: Student

## 2022-07-18 DIAGNOSIS — Z760 Encounter for issue of repeat prescription: Secondary | ICD-10-CM

## 2022-07-18 DIAGNOSIS — F1190 Opioid use, unspecified, uncomplicated: Secondary | ICD-10-CM

## 2022-07-18 DIAGNOSIS — Z789 Other specified health status: Secondary | ICD-10-CM

## 2022-07-18 NOTE — ED Provider Notes (Signed)
Behavioral Health Urgent Care Medical Screening Exam  Patient Name: Barbara Simon MRN: [id number] Date of Evaluation: 07/18/22 Chief Complaint:   "  I need to be restated on my medications."  Diagnosis:  Final diagnoses:  Need for community resource  Medication refill    History of Present illness: Barbara Simon is a 47 y.o. female.  Presents to [facility] accompanied by her husband. Patient  requesting medication refills. Reported she was prescribed Celexa and hasn't been on her medication for the past year. States she lost her insurance and has not been taking her medications. She reported Celexa helps with her seizures disorder.  Patient reported she is diagnosed with depression, anxiety and bipolar disorder. Reported she has been using xanax occasionally. Denied that she is prescribed xanax. Discussed following up with her insurance company to see who is in her network and or primary care provider. Patient left this assessment without resources for [employer].    Barbara Simon is sitting in no acute distress.  She is alert/oriented x 4; calm/cooperative; and mood congruent with affect. She is speaking in a clear tone at moderate volume, and normal pace; with good eye contact. Her thought process is coherent and relevant; There is no indication that she is currently responding to internal/external stimuli or experiencing delusional thought content; and she has denied suicidal/self-harm/homicidal ideation, psychosis, and paranoia.   Patient has remained calm throughout assessment and has answered questions appropriately.     At this time Barbara Simon is educated and verbalizes understanding of mental health resources and other crisis services in the community. She is instructed to call 911 and present to the nearest emergency room should she experience any suicidal/homicidal ideation, auditory/visual/hallucinations, or detrimental worsening of her mental  health condition.  She was a also advised by Clinical research associate that she could call the toll-free phone on insurance card to assist with identifying in network counselors and agencies or number on back of Medicaid card t speak with care coordinator   Flowsheet Row ED from 07/18/2022 in [facility] ED from 06/27/2021 in [facility] Department at [facility] ED from 11/28/2020 in [facility] at [facility] RISK CATEGORY Error: Question 6 not populated Error: Question 6 not populated No Risk       Psychiatric Specialty Exam  Presentation  General Appearance:Appropriate for Environment  Eye Contact:Good  Speech:Clear and Coherent  Speech Volume:Normal  Handedness:Right   Mood and Affect  Mood:Anxious  Affect:Congruent   Thought Process  Thought Processes:Coherent  Descriptions of Associations:Intact  Orientation:Full (Time, Place and Person)  Thought Content:Logical    Hallucinations:Auditory  Ideas of Reference:None  Suicidal Thoughts:No  Homicidal Thoughts:No   Sensorium  Memory:Immediate Fair; Recent Fair  Judgment:Fair  Insight:Fair   Executive Functions  Concentration:Fair  Attention Span:Good  Recall:Good  Fund of Knowledge:Good  Language:Good   Psychomotor Activity  Psychomotor Activity:Decreased   Assets  Assets:Desire for Improvement; Social Support   Sleep  Sleep:Fair  Number of hours: No data recorded  Physical Exam: Physical Exam Vitals and nursing note reviewed.  Cardiovascular:     Rate and Rhythm: Normal rate and regular rhythm.     Pulses: Normal pulses.     Heart sounds: Normal heart sounds.  Neurological:     Mental Status: She is alert and oriented to person, place, and time.  Psychiatric:        Mood and Affect: Mood normal.  Behavior: Behavior normal.        Thought Content: Thought content normal.    Review of Systems  Respiratory: Negative.     Cardiovascular: Negative.   Neurological: Negative.   Psychiatric/Behavioral:  Negative for suicidal ideas. The patient is nervous/anxious.   All other systems reviewed and are negative.  Blood pressure 123/81, pulse 65, temperature 97.7 F (36.5 C), temperature source Oral, resp. rate 18, SpO2 100 %. There is no height or weight on file to calculate BMI.  Musculoskeletal: Strength & Muscle Tone: within normal limits Gait & Station: normal Patient leans: N/A   [facility] MSE Discharge Disposition for Follow up and Recommendations: Based on my evaluation the patient does not appear to have an emergency medical condition and can be discharged with resources and follow up care in outpatient services for Medication Management   Oneta Rack, NP 07/18/2022, 11:08 AM

## 2022-07-18 NOTE — Progress Notes (Signed)
   07/18/22 0904  [facility] Triage Screening (Walk-ins at [facility] only)  How Did You Hear About [name]? Family/Friend  What Is the Reason for Your Visit/Call Today? Pt presents to [facility] accompanied by her spouse seeking medication management. Pt states she was prescribed medication by [facility] for her seizures and she lost her health insurance about 1 year ago and has been unable to get her Celexa. Pt reports she is diagnosed with Bipolar disorder, depression, a seizure disorder. Pt reports her last seizure was 4 weeks ago. Pt reports using marijuana daily, last use was 1 hour ago. Pt reports occasional Xanax use, pt states she is not prescribed Xanax.Pt denies SI/HI and AVH.  How Long Has This Been Causing You Problems? > than 6 months  Have You Recently Had Any Thoughts About Hurting Yourself? No  Are You Planning to Commit Suicide/Harm Yourself At This time? No  Have you Recently Had Thoughts About Hurting Someone Karolee Ohs? No  Are You Planning To Harm Someone At This Time? No  Are you currently experiencing any auditory, visual or other hallucinations? No  Have You Used Any Alcohol or Drugs in the Past 24 Hours? Yes  How long ago did you use Drugs or Alcohol? 1 hour ago  What Did You Use and How Much? marijuana  Do you have any current medical co-morbidities that require immediate attention? No  Clinician description of patient physical appearance/behavior: casually dressed, cooperative  What Do You Feel Would Help You the Most Today? Medication(s)  If access to [facility] was not available, would you have sought care in the Emergency Department? No  Determination of Need Routine (7 days)  Options For Referral Outpatient Therapy;Medication Management

## 2022-07-22 ENCOUNTER — Ambulatory Visit (INDEPENDENT_AMBULATORY_CARE_PROVIDER_SITE_OTHER): Payer: BC Managed Care – PPO | Admitting: Student

## 2022-07-22 ENCOUNTER — Other Ambulatory Visit: Payer: Self-pay

## 2022-07-22 ENCOUNTER — Encounter: Payer: Self-pay | Admitting: Student

## 2022-07-22 VITALS — BP 109/70 | HR 93 | Temp 98.4°F | Ht 63.0 in | Wt 160.2 lb

## 2022-07-22 DIAGNOSIS — F445 Conversion disorder with seizures or convulsions: Secondary | ICD-10-CM

## 2022-07-22 DIAGNOSIS — J069 Acute upper respiratory infection, unspecified: Secondary | ICD-10-CM | POA: Diagnosis not present

## 2022-07-22 DIAGNOSIS — R569 Unspecified convulsions: Secondary | ICD-10-CM

## 2022-07-22 NOTE — Assessment & Plan Note (Addendum)
Patient reports 1 episode of loss of consciousness about 1 month ago, thought was due to seizure.  She states that she passed out for few seconds in the backyard without urinary/bowel incontinence or tongue biting injury.  Husband states that she was talking afterward but was disoriented.  For the last few months she continues to have the seizure-like activities which consist of head tremor and body stiffness.  Husband states that she did not lose consciousness during these episodes and was able to talk. Patient also exhibited these myoclonic jerks during the interview while she was awake, alert and talking.  She states that these episodes have worsened when she switched to working third shift.  Her job is a Fish farm manager.  Patient would like a note to her employer to switch her back to dayshift or take her out of work.   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  I think mental health plays a big role in this disorder.  A neurology referral and reassurance with closed follow up will be beneficial for her.  In the meantime we will work on getting her into a psychiatrist to help manage her bipolar disorder and depression.  Her PHQ-9 today is 24 without suicidal ideation.  I am not comfortable of starting her on mood stabilizer medication.  She was on Lamictal and Seroquel at some point in the past.  She is seeing Barbara Simon for behavioral health therapy and we will try to get her to [facility] behavioral health as soon as we can.

## 2022-07-22 NOTE — Progress Notes (Signed)
CC: congestion and seizures like activity  HPI:  [name] is a 47 y.o. living with asthma, bipolar disorder, depression, psychogenic seizure who presents to the clinic for 3 days of coughing and congestion, and also for evaluation of her persistent psychogenic seizures.  Please see problem based charting for detail  Past Medical History:  Diagnosis Date   Abnormal Pap smear    LSIL   Anxiety    Asthma    Bipolar 1 disorder (HCC)    Depression    Hepatitis C    two years ago was dx   Ovarian cyst    Seizure (HCC)    Thyrotoxicosis    Review of Systems:  per HPI  Physical Exam:  Vitals:   07/22/22 1043  BP: 109/70  Pulse: 93  Temp: 98.4 F (36.9 C)  TempSrc: Oral  SpO2: 99%  Weight: 160 lb 3.2 oz (72.7 kg)  Height: 5\' 3"  (1.6 m)   Physical Exam Constitutional:      General: She is not in acute distress.    Appearance: She is not ill-appearing.  HENT:     Head: Normocephalic.     Nose: No congestion or rhinorrhea.     Mouth/Throat:     Mouth: Mucous membranes are moist.     Pharynx: Oropharynx is clear. No oropharyngeal exudate or posterior oropharyngeal erythema.  Eyes:     General:        Right eye: No discharge.        Left eye: No discharge.     Conjunctiva/sclera: Conjunctivae normal.  Cardiovascular:     Rate and Rhythm: Normal rate and regular rhythm.  Pulmonary:     Effort: Pulmonary effort is normal. No respiratory distress.     Breath sounds: Normal breath sounds. No wheezing.  Neurological:     Mental Status: She is alert.     Comments: Patient had constant myoclonic action of her head during the interview.  She was awake, alert and oriented during these episode.  No loss of consciousness.  She usually paused the shaking episode while talking.  Psychiatric:     Comments: Sad affect.      Assessment & Plan:   See Encounters Tab for problem based charting.  Viral URI Patient endorses symptoms of nonproductive cough and nasal  congestion for the last 3 days.  She has mild pressure feeling in her maxillary sinuses.  She did not check her temperature at home but endorses chills.  She also reports shortness of breath with mild wheezing.  She uses albuterol inhaler 2-3 times a day.  She states that for several family members have similar symptoms.  She also has seasonal allergy but she could not afford antihistamine medication over-the-counter.  Her exam is reassuring.  Lungs are clear bilaterally without wheezing.  No oral erythema or purulent discharge.  No rhinorrhea or nasal obstruction seen.  Patient satting well on room air without tachycardia or tachypnea.  This is likely viral URI and seasonal allergy.  No evidence of asthma exacerbation.  Advised patient to use Flonase to help with nasal congestion and over-the-counter dextromethorphan for cough.  She could also take cetirizine or Zyrtec for her allergy.  No antibiotic indicated.  Psychogenic nonepileptic seizure Patient reports 1 episode of loss of consciousness about 1 month ago, thought was due to seizure.  She states that she passed out for few seconds in the backyard without urinary/bowel incontinence or tongue biting injury.  Husband states that she  was talking afterward but was disoriented.  For the last few months she continues to have the seizure-like activities which she will have head tremors and body stiffness.  Husband states that she did not lose consciousness during these episodes and was able to talk. Patient also exhibited these myoclonic jerks during the interview while she was awake, alert and talking.  She states that these episodes have worsened when she switched to working third shift.  Her job is a Fish farm manager.  Patient would like a note to her employer to switch her back to dayshift or take her out of work.   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  I think mental health plays a big role in this disorder.  I  think neurology referral and reassurance will be beneficial for her.  In the meantime we will work to get her into a psychiatrist to help manage her bipolar disorder and depression.  Her PHQ-9 today is 24 without suicidal ideation.  I am not comfortable of starting her on mood stabilizer medication.  She was on Lamictal and Seroquel at some point in the past.  She is seeing Marena Chancy for behavioral health therapy and we will try to get her to [facility] behavioral health as soon as we can.   Patient discussed with Dr.  Lafonda Mosses

## 2022-07-22 NOTE — Assessment & Plan Note (Signed)
Patient endorses symptoms of nonproductive cough and nasal congestion for the last 3 days.  She has mild pressure feeling in her maxillary sinuses.  She did not check her temperature at home but endorses chills.  She also reports shortness of breath with mild wheezing.  She uses albuterol inhaler 2-3 times a day.  She states that for several family members have similar symptoms.  She also has seasonal allergy but she could not afford antihistamine medication over-the-counter.  Her exam is reassuring.  Lungs are clear bilaterally without wheezing.  No oral erythema or purulent discharge.  No rhinorrhea or nasal obstruction seen.  Patient satting well on room air without tachycardia or tachypnea.  This is likely viral URI and seasonal allergy.  No evidence of asthma exacerbation.  Advised patient to use Flonase to help with nasal congestion and over-the-counter dextromethorphan for cough.  She could also take cetirizine or Zyrtec for her allergy.  No antibiotic indicated.

## 2022-07-22 NOTE — Patient Instructions (Addendum)
Ms. Norato,  I am sorry not feeling well.  1.  Your congestion is likely seasonal allergy and also a viral upper respiratory tract infection.  You can use Flonase to help with the nasal congestion and over-the-counter medication such as dextromethorphan for your cough.  2.  For your seizure-like activities, I placed a referral to a neurologist who can help further workup your condition.  3.  I will check on the referral status with your psychiatrist.  Please follow-up in 1 month  Dr. Cyndie Chime

## 2022-07-23 ENCOUNTER — Encounter: Payer: Self-pay | Admitting: Neurology

## 2022-07-25 ENCOUNTER — Other Ambulatory Visit: Payer: Self-pay | Admitting: Student

## 2022-07-25 ENCOUNTER — Telehealth: Payer: Self-pay

## 2022-07-25 DIAGNOSIS — G6289 Other specified polyneuropathies: Secondary | ICD-10-CM

## 2022-07-25 DIAGNOSIS — J4530 Mild persistent asthma, uncomplicated: Secondary | ICD-10-CM

## 2022-07-25 MED ORDER — MEGESTROL ACETATE 40 MG PO TABS: ORAL_TABLET | ORAL | 0 refills | Status: DC

## 2022-07-25 NOTE — Telephone Encounter (Signed)
Rx megace to stop bleeding  

## 2022-07-25 NOTE — Telephone Encounter (Signed)
Patient called and scheduled an appointment for Monday. Patient stated that she is bleeding heavy and wanted to know if something can be sent in until her appointment on Monday.

## 2022-07-28 ENCOUNTER — Encounter: Payer: Self-pay | Admitting: Adult Health

## 2022-07-28 ENCOUNTER — Ambulatory Visit: Payer: BC Managed Care – PPO | Admitting: Adult Health

## 2022-07-28 VITALS — BP 101/76 | HR 79 | Ht 63.0 in | Wt 160.0 lb

## 2022-07-28 DIAGNOSIS — N938 Other specified abnormal uterine and vaginal bleeding: Secondary | ICD-10-CM | POA: Insufficient documentation

## 2022-07-28 DIAGNOSIS — R5383 Other fatigue: Secondary | ICD-10-CM | POA: Insufficient documentation

## 2022-07-28 DIAGNOSIS — N951 Menopausal and female climacteric states: Secondary | ICD-10-CM | POA: Diagnosis not present

## 2022-07-28 DIAGNOSIS — N926 Irregular menstruation, unspecified: Secondary | ICD-10-CM | POA: Diagnosis not present

## 2022-07-28 DIAGNOSIS — R232 Flushing: Secondary | ICD-10-CM | POA: Insufficient documentation

## 2022-07-28 DIAGNOSIS — R7989 Other specified abnormal findings of blood chemistry: Secondary | ICD-10-CM

## 2022-07-28 MED ORDER — FUSION PLUS PO CAPS: 1.0000 | ORAL_CAPSULE | Freq: Every day | ORAL | 0 refills | Status: DC

## 2022-07-28 NOTE — Progress Notes (Signed)
  Subjective:     Patient ID: Barbara Simon, female   DOB: [birth year], 47 y.o.   MRN: [id number]  HPI Barbara Simon is a 47 year old white female, married, G3P3 in complaining of having missed period from September to April then had heavy bleeding at [place] and then started back on May and really heavy, started megace Friday after calling. She says she is tired and still having hot flashes. She is out of work due to other issues. FSH was 65.2 in February TSH and free T4 was normal, Creatinine was 1.20, HGB was 12.9. [name] negative in November 2023. She was not happy today, said we didn't call her back when she called and she does not do technology well, with Mychart.   Last pap was negative HPV ,NILM 02/03/22  Review of Systems +heavy bleeding +hot flashes +tired   Reviewed past medical,surgical, social and family history. Reviewed medications and allergies.  Objective:   Physical Exam BP 101/76 (BP Location: Left Arm, Patient Position: Sitting, Cuff Size: Normal)   Pulse 79   Ht 5\' 3"  (1.6 m)   Wt 160 lb (72.6 kg)   LMP 07/25/2022   BMI 28.34 kg/m   Skin warm and dry. Lungs: clear to ausculation bilaterally. Cardiovascular: regular rate and rhythm.    She declines exam  [facility]stream - 07/28/22 1116       Pregnancy Intention Screening   Does the patient want to become pregnant in the next year? No    Does the patient's partner want to become pregnant in the next year? No    Would the patient like to discuss contraceptive options today? No      Contraception Wrap Up   Current Method Female Sterilization    End Method Female Sterilization             Assessment:     1. DUB (dysfunctional uterine bleeding) Continue megace, if bleeding persists, could try IUD or ablation  - CBC w/Diff  2. Irregular menses Discussed can be irregular during perimenopause  - CBC w/Diff  3. Perimenopause Discussed that yes FSH is in PM range but ovaries can still function and will miss periods and  have heavy periods when comes on  4. Hot flashes  5. Elevated serum creatinine  - Comprehensive metabolic panel  6. Tired Gave 48 caps of Fusion plus to take 1 daily     Plan:     Follow up TBD

## 2022-07-28 NOTE — Addendum Note (Signed)
Addended by: Cyril Mourning A on: 07/28/2022 12:14 PM   Modules accepted: Orders

## 2022-07-29 ENCOUNTER — Ambulatory Visit (HOSPITAL_BASED_OUTPATIENT_CLINIC_OR_DEPARTMENT_OTHER): Payer: BC Managed Care – PPO | Admitting: Psychiatry

## 2022-07-29 ENCOUNTER — Encounter (HOSPITAL_COMMUNITY): Payer: Self-pay | Admitting: Psychiatry

## 2022-07-29 ENCOUNTER — Telehealth: Payer: Self-pay | Admitting: Adult Health

## 2022-07-29 DIAGNOSIS — F411 Generalized anxiety disorder: Secondary | ICD-10-CM

## 2022-07-29 DIAGNOSIS — F3181 Bipolar II disorder: Secondary | ICD-10-CM | POA: Diagnosis not present

## 2022-07-29 LAB — CBC WITH DIFFERENTIAL/PLATELET
Absolute Monocytes: 469 cells/uL (ref 200–950)
Basophils Absolute: 7 cells/uL (ref 0–200)
Basophils Relative: 0.1 %
Eosinophils Absolute: 48 cells/uL (ref 15–500)
Eosinophils Relative: 0.7 %
HCT: 38.7 % (ref 35.0–45.0)
Hemoglobin: 13.2 g/dL (ref 11.7–15.5)
Lymphs Abs: 2256 cells/uL (ref 850–3900)
MCH: 31.2 pg (ref 27.0–33.0)
MCHC: 34.1 g/dL (ref 32.0–36.0)
MCV: 91.5 fL (ref 80.0–100.0)
MPV: 10 fL (ref 7.5–12.5)
Monocytes Relative: 6.8 %
Neutro Abs: 4119 cells/uL (ref 1500–7800)
Neutrophils Relative %: 59.7 %
Platelets: 226 10*3/uL (ref 140–400)
RBC: 4.23 10*6/uL (ref 3.80–5.10)
RDW: 14 % (ref 11.0–15.0)
Total Lymphocyte: 32.7 %
WBC: 6.9 10*3/uL (ref 3.8–10.8)

## 2022-07-29 LAB — COMPREHENSIVE METABOLIC PANEL
AG Ratio: 1.5 (calc) (ref 1.0–2.5)
ALT: 8 U/L (ref 6–29)
AST: 12 U/L (ref 10–30)
Albumin: 3.7 g/dL (ref 3.6–5.1)
Alkaline phosphatase (APISO): 58 U/L (ref 31–125)
BUN: 7 mg/dL (ref 7–25)
CO2: 24 mmol/L (ref 20–32)
Calcium: 9 mg/dL (ref 8.6–10.2)
Chloride: 109 mmol/L (ref 98–110)
Creat: 0.71 mg/dL (ref 0.50–0.99)
Globulin: 2.4 g/dL (calc) (ref 1.9–3.7)
Glucose, Bld: 75 mg/dL (ref 65–99)
Potassium: 3.9 mmol/L (ref 3.5–5.3)
Sodium: 142 mmol/L (ref 135–146)
Total Bilirubin: 0.3 mg/dL (ref 0.2–1.2)
Total Protein: 6.1 g/dL (ref 6.1–8.1)

## 2022-07-29 MED ORDER — LAMOTRIGINE 25 MG PO TABS: ORAL_TABLET | ORAL | 0 refills | Status: DC

## 2022-07-29 MED ORDER — TRAZODONE HCL 50 MG PO TABS: 50.0000 mg | ORAL_TABLET | Freq: Every evening | ORAL | 1 refills | Status: DC | PRN

## 2022-07-29 MED ORDER — ARIPIPRAZOLE 2 MG PO TABS: 2.0000 mg | ORAL_TABLET | Freq: Every day | ORAL | 1 refills | Status: DC
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
## 2022-07-29 NOTE — Telephone Encounter (Signed)
Pt aware of labs and bleeding has slowed way down, can not take iron

## 2022-07-29 NOTE — Progress Notes (Signed)
Internal Medicine Clinic Attending  Case discussed with Dr. Nguyen  At the time of the visit.  We reviewed the resident's history and exam and pertinent patient test results.  I agree with the assessment, diagnosis, and plan of care documented in the resident's note. 

## 2022-08-01 DIAGNOSIS — R509 Fever, unspecified: Secondary | ICD-10-CM | POA: Diagnosis not present

## 2022-08-01 DIAGNOSIS — J45998 Other asthma: Secondary | ICD-10-CM | POA: Diagnosis not present

## 2022-08-01 DIAGNOSIS — R059 Cough, unspecified: Secondary | ICD-10-CM | POA: Diagnosis not present

## 2022-08-04 ENCOUNTER — Other Ambulatory Visit (HOSPITAL_COMMUNITY): Payer: Self-pay | Admitting: Psychiatry

## 2022-08-04 DIAGNOSIS — F3181 Bipolar II disorder: Secondary | ICD-10-CM

## 2022-08-04 DIAGNOSIS — F411 Generalized anxiety disorder: Secondary | ICD-10-CM

## 2022-08-08 ENCOUNTER — Ambulatory Visit (INDEPENDENT_AMBULATORY_CARE_PROVIDER_SITE_OTHER): Payer: BC Managed Care – PPO | Admitting: Student

## 2022-08-08 ENCOUNTER — Encounter: Payer: Self-pay | Admitting: Student

## 2022-08-08 ENCOUNTER — Telehealth: Payer: Self-pay | Admitting: Student

## 2022-08-08 ENCOUNTER — Other Ambulatory Visit: Payer: Self-pay | Admitting: Adult Health

## 2022-08-08 DIAGNOSIS — J4530 Mild persistent asthma, uncomplicated: Secondary | ICD-10-CM

## 2022-08-08 DIAGNOSIS — J069 Acute upper respiratory infection, unspecified: Secondary | ICD-10-CM

## 2022-08-08 MED ORDER — MEGESTROL ACETATE 40 MG PO TABS: ORAL_TABLET | ORAL | 1 refills | Status: DC

## 2022-08-08 MED ORDER — IPRATROPIUM BROMIDE 0.02 % IN SOLN: 0.5000 mg | RESPIRATORY_TRACT | 0 refills | Status: DC | PRN

## 2022-08-08 NOTE — Telephone Encounter (Signed)
Informed pt's husband,Joshua, we do not have any appts today. He stated pt has been in the bed for almost weeks. Stated [facility] prescribed abx and steroids; she felt better but once she finished taking them ,she was back feeling bad as before. He stated they ordered Albuterol neb but the way it was written, the pharmacy was unable fill it. He wants to know if pt's doctor could write another rx. Pt added to this afternoon for a telehealth appt @ 1500PM; but I will send pt's concerns to her PCP. Thanks

## 2022-08-08 NOTE — Telephone Encounter (Signed)
Pt was seen at a [facility]" on 08/01/2022 which was a "telehealth" visit.  Per pt's husband Ivin Booty she is still not feeling any better. Pt still having cough, congestion, Headaches, N&V and has had fever.  Please call back.

## 2022-08-08 NOTE — Progress Notes (Signed)
[facility] Internal Medicine Residency Telephone Encounter Continuity Care Appointment  HPI:  This telephone encounter was created for [name] on 08/08/2022 for the following purpose/cc cold.   Past Medical History:  Past Medical History:  Diagnosis Date   Abnormal Pap smear    LSIL   Anxiety    Asthma    Bipolar 1 disorder (HCC)    Depression    Hepatitis C    two years ago was dx   Ovarian cyst    Seizure (HCC)    Thyrotoxicosis      ROS:     Assessment / Plan / Recommendations:  Viral URI Patient present for telehealth appointment after a recent urgent care visit for shortness of breath after a week of coryze, rhinorrhea, cough without sputum production and chest tightness. Patient reports she had a temperature of 99 and oxygen saturation of 95% during that visit. She was prescribed a zpack and a steroid pack which she finished on Wednesday of this week. She was also supposed to get a nebulized treatment to be picked up at the pharmacy but there was a mistake in the prescription and she couldn't retrieve it.  Patient was feeling better until Wednesday night. Since yesterday, she has felts chest tightness similar to that she felt last Friday and can hear more wheezing today. Has been using albuterol inhaler 4-5x per day with moderate improvement so she decided to make this appointment in hopes she can get the nebulized treatment. Pateint is able to ambulate to and from bedroom to kitchen or bathroom without SOB but reports that she has had to slow down significantly as walking fast makes her wheeze.   She denies fevers, sputum production. She is able to breathe through her nose without having to alter her positining. Sleeping on one pillow.  She is an active smoker; last cigarette smoked one day ago.   Patient is speaking in full sentences with coherent speech and appropriate thought content. Given coryza, rhinorrhea and post nasal drip, suspect she is still  recovering from viral illness and worsened her asthma symptoms. Given minimal wheezing will not order a repeat steroid pack but will try bronchodilator as patient has a 2-pack per day history of smoking.   Instructed patient to continue using her allergy medications, albuterol inhaler up to 4x per day as needed and to pick up SABA nebulized treatment from pharmacy. If this does not provide improvement of her symptoms, she should be medically evaluated at the UC/ED. She has company at home and is able to get help should she need it. Otherwise, we will follow up in clinic on 6/7 at [facility] with Dr. Kirke Corin.   Patient expressed understanding and agreed with plan.   As always, pt is advised that if symptoms worsen or new symptoms arise, they should go to an urgent care facility or to to ER for further evaluation.   Consent and Medical Decision Making:  Patient discussed with Dr. Mayford Knife This is a telephone encounter between Barbara Simon and Morene Crocker ,MD on 08/08/2022 for a cold. The visit was conducted with the patient located at home and Morene Crocker ,MD at [facility]. The patient's identity was confirmed using their DOB and current address. The patient has consented to being evaluated through a telephone encounter and understands the associated risks (an examination cannot be done and the patient may need to come in for an appointment) / benefits (allows the patient to remain at home, decreasing exposure to  coronavirus). I personally spent 20 minutes on medical discussion.

## 2022-08-08 NOTE — Telephone Encounter (Signed)
I talked to pt - stated she was treated at the [facility] for [facility]. But now she feels"sick all over again" after completing the abx/steroids.Stated she's breathing better. I suggested going to UC/ED for in-person eval; she stated she was not going back to UC nor to the ED, she does want to "create a big bill". She's agreeable for telehealth this afternoon.

## 2022-08-08 NOTE — Assessment & Plan Note (Signed)
Patient present for telehealth appointment after a recent urgent care visit for shortness of breath after a week of coryze, rhinorrhea, cough without sputum production and chest tightness. Patient reports she had a temperature of 99 and oxygen saturation of 95% during that visit. She was prescribed a zpack and a steroid pack which she finished on Wednesday of this week. She was also supposed to get a nebulized treatment to be picked up at the pharmacy but there was a mistake in the prescription and she couldn't retrieve it.  Patient was feeling better until Wednesday night. Since yesterday, she has felts chest tightness similar to that she felt last Friday and can hear more wheezing today. Has been using albuterol inhaler 4-5x per day with moderate improvement so she decided to make this appointment in hopes she can get the nebulized treatment. Pateint is able to ambulate to and from bedroom to kitchen or bathroom without SOB but reports that she has had to slow down significantly as walking fast makes her wheeze.   She denies fevers, sputum production. She is able to breathe through her nose without having to alter her positining. Sleeping on one pillow.  She is an active smoker; last cigarette smoked one day ago.   Patient is speaking in full sentences with coherent speech and appropriate thought content. Given coryza, rhinorrhea and post nasal drip, suspect she is still recovering from viral illness and worsened her asthma symptoms. Given minimal wheezing will not order a repeat steroid pack but will try bronchodilator as patient has a 2-pack per day history of smoking.   Instructed patient to continue using her allergy medications, albuterol inhaler up to 4x per day as needed and to pick up SABA nebulized treatment from pharmacy. If this does not provide improvement of her symptoms, she should be medically evaluated at the UC/ED. She has company at home and is able to get help should she need it.  Otherwise, we will follow up in clinic on 6/7 at [facility] with Dr. Kirke Corin.   Patient expressed understanding and agreed with plan.

## 2022-08-08 NOTE — Progress Notes (Signed)
Refilled megace  

## 2022-08-12 ENCOUNTER — Encounter: Payer: Self-pay | Admitting: *Deleted

## 2022-08-14 ENCOUNTER — Encounter: Payer: Self-pay | Admitting: *Deleted

## 2022-08-15 ENCOUNTER — Telehealth: Payer: Self-pay | Admitting: *Deleted

## 2022-08-15 NOTE — Telephone Encounter (Signed)
Left message @ 12:23 pm, asking pt to have [facility] in [place] send [name] a PA form. JSY

## 2022-08-16 ENCOUNTER — Other Ambulatory Visit: Payer: Self-pay | Admitting: Adult Health

## 2022-08-18 ENCOUNTER — Other Ambulatory Visit: Payer: Self-pay

## 2022-08-18 DIAGNOSIS — F1190 Opioid use, unspecified, uncomplicated: Secondary | ICD-10-CM

## 2022-08-18 NOTE — Progress Notes (Signed)
Internal Medicine Clinic Attending  Case discussed with Dr. Gomez-Caraballo  At the time of the visit.  We reviewed the resident's history and exam and pertinent patient test results.  I agree with the assessment, diagnosis, and plan of care documented in the resident's note.  

## 2022-08-18 NOTE — Telephone Encounter (Signed)
Last rx written - 07/18/22. Last OV - 08/08/22. Next OV 08/22/22 with [place].

## 2022-08-19 MED ORDER — BUPRENORPHINE HCL-NALOXONE HCL 8-2 MG SL SUBL: 1.0000 | SUBLINGUAL_TABLET | Freq: Three times a day (TID) | SUBLINGUAL | 0 refills | Status: DC

## 2022-08-20 ENCOUNTER — Other Ambulatory Visit (HOSPITAL_COMMUNITY): Payer: Self-pay | Admitting: Psychiatry

## 2022-08-20 DIAGNOSIS — F411 Generalized anxiety disorder: Secondary | ICD-10-CM

## 2022-08-20 DIAGNOSIS — F3181 Bipolar II disorder: Secondary | ICD-10-CM

## 2022-08-21 ENCOUNTER — Telehealth: Payer: Self-pay

## 2022-08-21 NOTE — Telephone Encounter (Signed)
Okay, I will fill it out during her visit tomorrow. Thanks.

## 2022-08-21 NOTE — Telephone Encounter (Signed)
Dear Dr  Your patient has requested the following form be completed : FMLA AND DISABILITY. Please complete this ASAP for this pt. Paperwork has been in the office since 5/28. Pt states she need to get paid for the time that she is out. Pt states Dr. Cyndie Chime wrote a letter and put her out of work. Pt states she cannot go to work until she see neurology 7/11. Pt has an appt with you tomorrow, please discuss about FMLA and disability papework   Are you able to complete the form outside of an office appointment or do you need the pt sch for an appt?   Please respond within two business days.  [employer] Information systems manager

## 2022-08-22 ENCOUNTER — Encounter: Payer: Self-pay | Admitting: Student

## 2022-08-22 ENCOUNTER — Ambulatory Visit (INDEPENDENT_AMBULATORY_CARE_PROVIDER_SITE_OTHER): Payer: BC Managed Care – PPO | Admitting: Student

## 2022-08-22 VITALS — BP 103/65 | HR 93 | Temp 98.1°F | Ht 63.0 in | Wt 164.8 lb

## 2022-08-22 DIAGNOSIS — F3181 Bipolar II disorder: Secondary | ICD-10-CM | POA: Diagnosis not present

## 2022-08-22 DIAGNOSIS — F17210 Nicotine dependence, cigarettes, uncomplicated: Secondary | ICD-10-CM | POA: Diagnosis not present

## 2022-08-22 DIAGNOSIS — J4530 Mild persistent asthma, uncomplicated: Secondary | ICD-10-CM | POA: Diagnosis not present

## 2022-08-22 DIAGNOSIS — F445 Conversion disorder with seizures or convulsions: Secondary | ICD-10-CM | POA: Diagnosis not present

## 2022-08-22 DIAGNOSIS — G6289 Other specified polyneuropathies: Secondary | ICD-10-CM

## 2022-08-22 MED ORDER — BUDESONIDE-FORMOTEROL FUMARATE 80-4.5 MCG/ACT IN AERO: 2.0000 | INHALATION_SPRAY | Freq: Two times a day (BID) | RESPIRATORY_TRACT | 12 refills | Status: DC

## 2022-08-22 MED ORDER — GABAPENTIN 300 MG PO CAPS: ORAL_CAPSULE | ORAL | 5 refills | Status: DC

## 2022-08-22 NOTE — Assessment & Plan Note (Signed)
Patient was evaluated by psychiatry on 5/14 via a televisit. She was started on treatment for her bipolar with planned clinic visit in 2 weeks. Patient states she has been doing well on the medications.  Plan: -Continue Lamictal and Abilify -Follow-up with psychiatry as scheduled on 6/17

## 2022-08-22 NOTE — Assessment & Plan Note (Signed)
Patient reports she has been getting over a diagnosis of bronchitis a few weeks ago. Symptoms have improved but she continues to have occasional shortness of breath and wheezing.  On exam, she has decreased air movement at the bases since mild respiratory wheezes.  She is currently on as needed albuterol inhaler. I will start her on an ICS-LABA as her maintenance and rescue inhaler.  Plan: -Start Symbicort 2 puffs twice daily AND every 4 hours as needed for shortness of breath or wheezing -Continue as needed albuterol and Atrovent nebulizer solution

## 2022-08-22 NOTE — Telephone Encounter (Signed)
Called and spoke with pt, to informed her paperwork for FMLA and disability has been completed and faxed. Confirmation for the faxed went through. Original copy is ready for pt to pick up.

## 2022-08-22 NOTE — Assessment & Plan Note (Signed)
Patient presenting today to discuss her short-term disability form. She has a follow-up scheduled with neurology next week for evaluation of her seizure-like activity that has been limited her ability to work effectively. These myoclonic jerks have been very distressing to her especially while she is in a public setting. She tells me that she has noticed worsening of her jerking when she is under significant distress. She had multiple episodes of jerking of the head and neck during the visit all while she was awake. There was no loss of consciousness or postictal state.  She  was recently evaluated by psychiatry and has begun treatment for psychiatric problems.  Plan: -Short term disability form completed today -Follow-up with neurology as scheduled on 6/11

## 2022-08-22 NOTE — Progress Notes (Addendum)
CC: FMLA, Asthma, seizure-like activity  HPI:  Barbara Simon is a 47 y.o. female with PMH as below who presents to clinic to discuss her short-term disability, her asthma and seizure-like activity. Please see problem based charting for evaluation, assessment and plan.  Past Medical History:  Diagnosis Date   Abnormal Pap smear    LSIL   Anxiety    Asthma    Bipolar 1 disorder (HCC)    Depression    Hepatitis C    two years ago was dx   Ovarian cyst    Seizure (HCC)    Thyrotoxicosis     Review of Systems:  Constitutional: Negative for fever or fatigue HEENT: Mild hearing loss from L ear Respiratory: Positive for occasional shortness of breath and dry cough Neuro: Positive for jerking of the head and neck. Negative for headache or weakness Psych: Positive for depression  Physical Exam: General: Well-appearing middle-aged woman. No acute distress. HEENT: L ear w/ dry blood obscuring the tympanic membrane. No effusion or drainge Cardiac: RRR. No murmurs, rubs or gallops. No LE edema Respiratory: Lungs CTAB. Normal WOB. Mild expiratory wheezes and diminished air movement at the bases. No rales or rhonchi. Skin: Warm, dry and intact without rashes or lesions Extremities: Atraumatic. Full ROM. Palpable radial and DP pulses. Neuro: A&O x 3. Intermittent myoclonic jerks of the head for 5-10 secs during encounter while awake. Return to baseline mental status right after each episode. No LOC.  Psych: Depressed mood  Vitals:   08/22/22 0853  BP: 103/65  Pulse: 93  Temp: 98.1 F (36.7 C)  TempSrc: Oral  SpO2: 100%  Weight: 164 lb 12.8 oz (74.8 kg)  Height: 5\' 3"  (1.6 m)    Assessment & Plan:   Psychogenic nonepileptic seizure Patient presenting today to discuss her short-term disability form. She has a follow-up scheduled with neurology next week for evaluation of her seizure-like activity that has been limited her ability to work effectively. These myoclonic jerks  have been very distressing to her especially while she is in a public setting. She tells me that she has noticed worsening of her jerking when she is under significant distress. She had multiple episodes of jerking of the head and neck during the visit all while she was awake. There was no loss of consciousness or postictal state.  She  was recently evaluated by psychiatry and has begun treatment for psychiatric problems.  Plan: -Short term disability form completed today -Follow-up with neurology as scheduled on 6/11  Bipolar II disorder (HCC) Patient was evaluated by psychiatry on 5/14 via a televisit. She was started on treatment for her bipolar with planned clinic visit in 2 weeks. Patient states she has been doing well on the medications.  Plan: -Continue Lamictal and Abilify -Follow-up with psychiatry as scheduled on 6/17  Asthma Patient reports she has been getting over a diagnosis of bronchitis a few weeks ago. Symptoms have improved but she continues to have occasional shortness of breath and wheezing.  On exam, she has decreased air movement at the bases since mild respiratory wheezes.  She is currently on as needed albuterol inhaler. I will start her on an ICS-LABA as her maintenance and rescue inhaler.  Plan: -Start Symbicort 2 puffs twice daily AND every 4 hours as needed for shortness of breath or wheezing -Continue as needed albuterol and Atrovent nebulizer solution   See Encounters Tab for problem based charting.  Patient discussed with Dr. Anthoney Harada, MD,  MPH

## 2022-08-22 NOTE — Patient Instructions (Signed)
Thank you, Ms.Loyola H Boehringer for allowing [name] to provide your care today. Today we discussed your short-term disability, recent viral infection, asthma.  I will fill out his short-term disability paperwork so he can be faxed to-year-old lawyer.  Make sure to follow-up with neurology and psychiatry over the next few weeks.  I have ordered the following medication/changed the following medications:  Start Symbicort inhaler 2 puffs twice daily and every 4 hours as needed for shortness of breath or wheezing.  My Chart Access: [email or web address]  Please follow-up as needed  Please make sure to arrive 15 minutes prior to your next appointment. If you arrive late, you may be asked to reschedule.    We look forward to seeing you next time. Please call our clinic at [phone] if you have any questions or concerns. The best time to call is Monday-Friday from 9am-4pm, but there is someone available 24/7. If after hours or the weekend, call the main hospital number and ask for the Internal Medicine Resident On-Call. If you need medication refills, please notify your pharmacy one week in advance and they will send [name] a request.   Thank you for letting us take part in your care. Wishing you the best!  Steffanie Rainwater, MD 08/22/2022, 9:27 AM IM Resident, PGY-3 [place]ne Heck 41:10

## 2022-08-25 NOTE — Progress Notes (Signed)
Internal Medicine Clinic Attending  Case discussed with Dr. Amponsah  At the time of the visit.  We reviewed the resident's history and exam and pertinent patient test results.  I agree with the assessment, diagnosis, and plan of care documented in the resident's note.  

## 2022-08-26 ENCOUNTER — Ambulatory Visit: Payer: BC Managed Care – PPO | Admitting: Neurology

## 2022-08-26 ENCOUNTER — Ambulatory Visit (INDEPENDENT_AMBULATORY_CARE_PROVIDER_SITE_OTHER): Payer: BC Managed Care – PPO | Admitting: Neurology

## 2022-08-26 ENCOUNTER — Encounter: Payer: Self-pay | Admitting: Neurology

## 2022-08-26 VITALS — BP 110/69 | HR 75 | Ht 63.0 in | Wt 165.6 lb

## 2022-08-26 DIAGNOSIS — G259 Extrapyramidal and movement disorder, unspecified: Secondary | ICD-10-CM | POA: Diagnosis not present

## 2022-08-26 DIAGNOSIS — R251 Tremor, unspecified: Secondary | ICD-10-CM | POA: Diagnosis not present

## 2022-08-26 DIAGNOSIS — R519 Headache, unspecified: Secondary | ICD-10-CM

## 2022-08-26 NOTE — Progress Notes (Signed)
EEG complete - results pending 
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
## 2022-08-26 NOTE — Patient Instructions (Signed)
Good to meet you.  EEG today. We will then schedule a 24-hour EEG to further evaluate brain wave activity  2. Cognitive Behavioral Therapy has been found to be the most helpful for Functional Neurological symptoms (head tremor and body shaking, falling). Please ensure follow-up is scheduled with a therapist and continue follow-up with Psychiatry  3. Discuss Pain Management referral with your PCP  4. Here are some helpful resources for non-epileptic events:  [email or web address].[email or web address]  [email or web address]  5. Follow-up in 3 months, call for any changes

## 2022-08-27 ENCOUNTER — Encounter: Payer: Self-pay | Admitting: Neurology

## 2022-09-01 ENCOUNTER — Encounter (HOSPITAL_COMMUNITY): Payer: Self-pay | Admitting: Psychiatry

## 2022-09-01 ENCOUNTER — Ambulatory Visit (HOSPITAL_BASED_OUTPATIENT_CLINIC_OR_DEPARTMENT_OTHER): Payer: BC Managed Care – PPO | Admitting: Psychiatry

## 2022-09-01 VITALS — BP 114/76 | HR 83 | Ht 63.0 in | Wt 168.0 lb

## 2022-09-01 DIAGNOSIS — F3181 Bipolar II disorder: Secondary | ICD-10-CM | POA: Diagnosis not present

## 2022-09-01 DIAGNOSIS — F411 Generalized anxiety disorder: Secondary | ICD-10-CM | POA: Diagnosis not present

## 2022-09-01 MED ORDER — LAMOTRIGINE 100 MG PO TABS: 100.0000 mg | ORAL_TABLET | Freq: Every day | ORAL | 2 refills | Status: DC

## 2022-09-01 MED ORDER — TRAZODONE HCL 50 MG PO TABS
25.0000 mg | ORAL_TABLET | Freq: Every evening | ORAL | 1 refills | Status: DC | PRN
Start: 2022-09-01 — End: 2023-01-15

## 2022-09-01 MED ORDER — ARIPIPRAZOLE 2 MG PO TABS: 2.0000 mg | ORAL_TABLET | Freq: Every day | ORAL | 1 refills | Status: DC

## 2022-09-01 MED ORDER — TRAZODONE HCL 50 MG PO TABS: 50.0000 mg | ORAL_TABLET | Freq: Every evening | ORAL | 1 refills | Status: DC | PRN

## 2022-09-01 NOTE — Progress Notes (Signed)
BH MD/PA/NP OP Progress Note  09/01/2022 2:56 PM Leonne Moneypenny Rockman  MRN:  [id number]  Visit Diagnosis:    ICD-10-CM   1. Bipolar 2 disorder (HCC)  F31.81 lamoTRIgine (LAMICTAL) 100 MG tablet    ARIPiprazole (ABILIFY) 2 MG tablet    traZODone (DESYREL) 50 MG tablet    DISCONTINUED: traZODone (DESYREL) 50 MG tablet    2. GAD (generalized anxiety disorder)  F41.1 lamoTRIgine (LAMICTAL) 100 MG tablet    ARIPiprazole (ABILIFY) 2 MG tablet    traZODone (DESYREL) 50 MG tablet    DISCONTINUED: traZODone (DESYREL) 50 MG tablet      Assessment: Easton H Timson is a 47 y.o. female with a history of bipolar disorder 2 disorder, opioid use disorder on suboxone maintenance, psychogenic seizures, GAD, and asthma who presented to [facility] for initial evaluation on 07/29/2022.  During initial evaluation patient reported symptoms of insomnia, irritability, mood lability, dysphoria, fatigue, weight loss, amotivation, and passive SI without intent or plan.  Safety planning was reviewed and patient is aware of crisis resources.  She does have a past history of hypomania including decreased sleep and increased energy outside of the context of substance use.  Patient does have a significant past history of substance use including cocaine, opiates, marijuana and benzodiazepines.  She denies any cocaine use and opiate use disorder is stable on Suboxone.  Patient does use marijuana regularly and benzodiazepines infrequently.  Education was provided and patient was recommended to discontinue the benzodiazepine and marijuana use.  Patient also is experiencing seizure-like symptoms including tremors, rigidity, disorientation, and intermittent loss of consciousness without associated tongue biting or loss of bowel/bladder control.  Patient meets criteria for bipolar 2 disorder and generalized anxiety disorder.  She also likely has a psychogenic seizure disorder will follow-up with neurology  for further clarification.    Frankee H Peitz presents for follow-up evaluation. Today, 09/01/22, patient reports some improvement in mood lability, irritability, anxiety, and twitches with the initiation of Lamictal and Abilify.  Despite this however anxiety remains a major issue along with the twitching episodes.  She denies any adverse side effects from the medications.  She has connected with a neurologist who completed an EEG results of which are still pending.  She is also scheduled for a 24-hour EEG in July.  We will increase Lamictal to 100 mg today.  We will also extend short-term disability until after long-term EEG.  Patient to reach out to her neurologist about a note to move from night to day shift.   Plan: - Increase Lamictal to 100 mg daily for bipolar disorder - Continue Abilify 2 mg QD for bipolar disorder - Continue Trazodone 25 mg QHS prn for insomnia - Continue Suboxone 8-2mg  films TID managed by PCP, patient only taking 1 film a day currently - CMP, CBC, alkaline phosphatase, TSH, T4 reviewed - Continue to follow with neurology - Extend short term disability to 09/23/22, patient to contact insurance company to see if paperwork needs to be completed or notes faxed over. - Therapy referral - Crisis resources reviewed - Follow up in a month   Chief Complaint:  Chief Complaint  Patient presents with   Follow-up   HPI: Kimmarie presents reporting that the last month has been stressful.  She did notice that after starting Lamictal and Abilify there was improvement in her mood symptoms.  She notes that overall she was less irritable and did not find her mood fluctuating as often.  Anxiety was  still high it had reduced some and twitches had improved to a degree.  She does still endorse poor memory/concentration.  Patient had taken the Lamictal regularly and the Abilify she had taken up until the last few days.  She had noted there was an incident where she knocked over the pill  container and a few pills all into a dog bowl coming unusable.  With the absence of Abilify last few days she has noticed her anxiety increased and she is more stressed.  In regards adverse side effects patient reports that she has gained about 6 pounds in the past 2 weeks.  We did review potential adverse side effects and effects of Abilify and weight gain.  Of note patient has been eating a bit more particularly right before bed.  We reviewed that watching diet and eating earlier in the evening would help with weight symptoms.  Sleep remains an issue though patient notes that she only used trazodone around 13 times this past month.  She denies any side effects and has felt that it helped some, though overall her sleep is still poor.  Of note Kailen endorsed significant anxiety about being over sedated.  She notes that in the past she had been prescribed trazodone 300 which caused her to feel loopy and gain weight.  We spoke about the difference between trazodone 25 and trazodone 300 mg and encouraged her to try to use it more regularly for sleep.  Patient has with her neurologist for her twitches.  She had initial EEG and results are pending from this.  Patient has also been scheduled for 24-hour EEG on 09-22-2022.  Of note patient's short-term disability was only set to go until today.  We agreed to extend it until the completion of her 24-hour EEG.  Patient had also requested a note to work day shift versus nights as this appears to be affecting the seizure episodes.  She was encouraged to reach out to her neurologist to discuss this.  Past Psychiatric History: Information collected from pt, medical record Prior hx "bipolar II". Has had hypomanic but not manic episodes outside of periods of substance abuse.  Patient's manic episodes appear to only occurred in context of substance use.  Patient has 2 prior psychiatric hospitalizations in 2005 and 2010 to [facility].  Has tried Lamictal, Depakote, Seroquel,  Abilify, Celexa, Cymbalta, Remeron, trazodone, Xanax  Sober on buprenorphine from opiates. Uses marijuana, home grown. She takes a couple Xanax from her mom when she is overwhelmed. Counseling was provided in regards to both. She denies   Past Medical History:  Past Medical History:  Diagnosis Date   Abnormal Pap smear    LSIL   Anxiety    Asthma    Bipolar 1 disorder (HCC)    Depression    Hepatitis C    two years ago was dx   Ovarian cyst    Seizure (HCC)    Thyrotoxicosis     Past Surgical History:  Procedure Laterality Date   ABDOMINAL SURGERY     APPENDECTOMY     TUBAL LIGATION      Family History:  Family History  Problem Relation Age of Onset   Cancer Maternal Grandmother    Cancer Mother        bladder   Diabetes Mother    Diabetes Brother    Diabetes Sister    Cerebral palsy Daughter    Cancer Maternal Aunt     Social History:  Social History  Socioeconomic History   Marital status: Married    Spouse name: Not on file   Number of children: Not on file   Years of education: Not on file   Highest education level: Not on file  Occupational History   Not on file  Tobacco Use   Smoking status: Some Days    Packs/day: .5    Types: Cigarettes   Smokeless tobacco: Never   Tobacco comments:    1 pack lasts 2 days   Vaping Use   Vaping Use: Never used  Substance and Sexual Activity   Alcohol use: No   Drug use: Yes    Types: Marijuana    Comment: Occasionally.   Sexual activity: Not Currently    Birth control/protection: Surgical    Comment: tubal  Other Topics Concern   Not on file  Social History Narrative   Are you right handed or left handed? right   Are you currently employed ? 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 no   Who lives with you?  family   What type of home do you live in: 1 story or 2 story?  2 story lives up stairs 15-20 steps       Social Determinants of Health   Financial Resource  Strain: High Risk (07/16/2021)   Overall Financial Resource Strain (CARDIA)    Difficulty of Paying Living Expenses: Very hard  Food Insecurity: Food Insecurity Present (07/16/2021)   Hunger Vital Sign    Worried About Running Out of Food in the Last Year: Sometimes true    Ran Out of Food in the Last Year: Often true  Transportation Needs: No Transportation Needs (07/16/2021)   PRAPARE - Administrator, Civil Service (Medical): No    Lack of Transportation (Non-Medical): No  Physical Activity: Insufficiently Active (07/16/2021)   Exercise Vital Sign    Days of Exercise per Week: 2 days    Minutes of Exercise per Session: 20 min  Stress: Stress Concern Present (07/16/2021)   [employer] of Occupational Health - Occupational Stress Questionnaire    Feeling of Stress : Very much  Social Connections: Socially Integrated (07/16/2021)   Social Connection and Isolation Panel [NHANES]    Frequency of Communication with Friends and Family: More than three times a week    Frequency of Social Gatherings with Friends and Family: More than three times a week    Attends Religious Services: More than 4 times per year    Active Member of [employer] or Organizations: Yes    Attends Banker Meetings: More than 4 times per year    Marital Status: Living with partner    Allergies:  Allergies  Allergen Reactions   Toradol [Ketorolac Tromethamine] Hives and Itching   Adhesive [Tape] Itching and Rash    Current Medications: Current Outpatient Medications  Medication Sig Dispense Refill   acetaminophen (TYLENOL) 500 MG tablet Take 500 mg by mouth every 6 (six) hours as needed for moderate pain.     albuterol (VENTOLIN HFA) 108 (90 Base) MCG/ACT inhaler TAKE 2 PUFFS BY MOUTH EVERY 6 HOURS AS NEEDED FOR WHEEZE OR SHORTNESS OF BREATH 18 each 2   ARIPiprazole (ABILIFY) 2 MG tablet Take 1 tablet (2 mg total) by mouth daily. 30 tablet 1   Aspirin-Acetaminophen-Caffeine (GOODY HEADACHE PO)  Take by mouth.     budesonide-formoterol (SYMBICORT) 80-4.5 MCG/ACT inhaler Inhale 2 puffs into the lungs in  the morning and at bedtime. Also uses as a rescue inhaler every 4 hours as needed for shortness of breath or wheezing 1 each 12   buprenorphine-naloxone (SUBOXONE) 8-2 mg SUBL SL tablet Place 1 tablet under the tongue 3 (three) times daily. 90 tablet 0   gabapentin (NEURONTIN) 300 MG capsule TAKE 3 CAPSULES (900MG  TOTAL) BY MOUTH 3 TIMES DAILY 180 capsule 5   hydrocortisone 2.5 % lotion Apply to affected area 2 times daily for active flairs. (Patient taking differently: Apply to affected area 2 times daily prn for active flairs.) 60 mL 1   ipratropium (ATROVENT) 0.02 % nebulizer solution Take 2.5 mLs (0.5 mg total) by nebulization every 4 (four) hours as needed for wheezing or shortness of breath. 450 mL 0   lamoTRIgine (LAMICTAL) 100 MG tablet Take 1 tablet (100 mg total) by mouth daily. 30 tablet 2   megestrol (MEGACE) 40 MG tablet Take 3 tablets  x 5 days the 2 x 5 days then 1 daily til bleeding stops 45 tablet 1   pantoprazole (PROTONIX) 40 MG tablet Take 1 tablet (40 mg total) by mouth daily. 90 tablet 3   polyethylene glycol powder (GLYCOLAX/MIRALAX) 17 GM/SCOOP powder 1-4 scoops daily or as needed 255 g 11   traZODone (DESYREL) 50 MG tablet Take 0.5-1 tablets (25-50 mg total) by mouth at bedtime as needed for sleep (insomnia). 15 tablet 1   No current facility-administered medications for this visit.     Psychiatric Specialty Exam: Review of Systems  Blood pressure 114/76, pulse 83, height 5\' 3"  (1.6 m), weight 168 lb (76.2 kg).Body mass index is 29.76 kg/m.  General Appearance: Fairly Groomed  Eye Contact:  Good  Speech:  Clear and Coherent  Volume:  Normal  Mood:  Anxious  Affect:  Congruent  Thought Process:  Coherent  Orientation:  Full (Time, Place, and Person)  Thought Content: Rumination   Suicidal Thoughts:  No  Homicidal Thoughts:  No  Memory:  Immediate;   Fair   Judgement:  Fair  Insight:  Fair  Psychomotor Activity:   Twitches in her neck and head have been present prior to starting Abilify  Concentration:  Concentration: Fair  Recall:  [employer] of Knowledge: Fair  Language: Good  Akathisia:  No    AIMS (if indicated): not done  Assets:  Communication Skills Desire for Improvement Transportation  ADL's:  Intact  Cognition: WNL  Sleep:  Fair   Metabolic Disorder Labs: Lab Results  Component Value Date   HGBA1C 5.0 08/21/2020   No results found for: "PROLACTIN" No results found for: "CHOL", "TRIG", "HDL", "CHOLHDL", "VLDL", "LDLCALC" Lab Results  Component Value Date   TSH 0.940 04/28/2022   TSH 0.646 07/03/2021    Therapeutic Level Labs: No results found for: "LITHIUM" No results found for: "VALPROATE" No results found for: "CBMZ"   Screenings: GAD-7    Flowsheet Row Office Visit from 08/22/2022 in [facility] Office Visit from 07/29/2022 in [facility] Office Visit from 07/16/2021 in [facility] Office Visit from 07/03/2021 in [facility] Office Visit from 12/18/2020 in [facility]  Total GAD-7 Score 21 19 20 19 8       PHQ2-9    Flowsheet Row Office Visit from 08/22/2022 in [facility] Office Visit from 07/29/2022 in [facility] Office Visit from 07/22/2022 in [facility] Office Visit from 06/20/2022 in [facility] Office Visit from 04/14/2022 in [facility]  PHQ-2 Total Score 5 6 6 5 4   PHQ-9 Total Score 22 23 24 20 19       Flowsheet Row Office Visit from 07/29/2022 in [facility] ED from 07/18/2022 in [facility] ED from 06/27/2021 in [facility] Department at  [facility]  C-SSRS RISK CATEGORY Low Risk Error: Question 6 not populated Error: Question 6 not populated       Collaboration of Care: Collaboration of Care: Medication Management AEB dictation prescription and Other provider involved in patient's care AEB neurology chart review  Patient/Guardian was advised Release of Information must be obtained prior to any record release in order to collaborate their care with an outside provider. Patient/Guardian was advised if they have not already done so to contact the registration department to sign all necessary forms in order for [name] to release information regarding their care.   Consent: Patient/Guardian gives verbal consent for treatment and assignment of benefits for services provided during this visit. Patient/Guardian expressed understanding and agreed to proceed.    Stasia Cavalier, MD 09/01/2022, 2:56 PM  Stasia Cavalier, MD

## 2022-09-03 ENCOUNTER — Encounter: Payer: Self-pay | Admitting: Neurology

## 2022-09-03 ENCOUNTER — Telehealth: Payer: Self-pay | Admitting: Neurology

## 2022-09-03 ENCOUNTER — Other Ambulatory Visit: Payer: Self-pay | Admitting: Adult Health

## 2022-09-03 NOTE — Telephone Encounter (Signed)
Patient is wanting to know if you could put in documentation that she is most likely to have seizures at night instead of during the day. Patient is trying to get this under the disability act. So she can be switched to first shift. The seizures have started back since working third shift .Barbara Simon

## 2022-09-03 NOTE — Telephone Encounter (Signed)
Mychart sent to pt.

## 2022-09-03 NOTE — Telephone Encounter (Signed)
Letter done, thanks.

## 2022-09-09 DIAGNOSIS — Z79899 Other long term (current) drug therapy: Secondary | ICD-10-CM | POA: Diagnosis not present

## 2022-09-09 DIAGNOSIS — K219 Gastro-esophageal reflux disease without esophagitis: Secondary | ICD-10-CM | POA: Diagnosis not present

## 2022-09-09 DIAGNOSIS — Z139 Encounter for screening, unspecified: Secondary | ICD-10-CM | POA: Diagnosis not present

## 2022-09-09 DIAGNOSIS — Z0189 Encounter for other specified special examinations: Secondary | ICD-10-CM | POA: Diagnosis not present

## 2022-09-10 NOTE — Procedures (Signed)
ELECTROENCEPHALOGRAM REPORT  Date of Study: 08/26/2022  Patient's Name: Barbara Simon MRN: [id number] Date of Birth: 12-07-1975  Referring Provider: Dr. Patrcia Dolly  Clinical History: This is a 47 year old woman with recurrent episodes of alternating side to side head shaking and head nodding movements, as well as episodes of loss of consciousness with fall. EEG for classification.  Medications: Lamictal, Gabapentin, Trazodone, Suboxone, Abilify  Technical Summary: A multichannel digital 1-hour EEG recording measured by the international 10-20 system with electrodes applied with paste and impedances below 5000 ohms performed in our laboratory with EKG monitoring in an awake and drowsy patient.  Hyperventilation and photic stimulation were performed.  The digital EEG was referentially recorded, reformatted, and digitally filtered in a variety of bipolar and referential montages for optimal display.    Description: The patient is awake and drowsy during the recording.  During maximal wakefulness, there is a symmetric, medium voltage 8-9 Hz posterior dominant rhythm that attenuates with eye opening.  The record is symmetric.  During drowsiness, there is an increase in theta slowing of the background. Sleep was not captured. There were multiple episodes of rapid side to side head shaking and head nodding, at times alternating with each other captured, more during photic stimulation. Hyperventilation and photic stimulation did not elicit any epileptiform abnormalities.  There were no epileptiform discharges or electrographic seizures seen.    EKG lead was unremarkable.  Impression: This 1-hour awake and drowsy EEG is normal.  Multiple episodes of side to side head shaking and head nodding did not show any EEG correlate.  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 Clinical correlation is advised.   Patrcia Dolly, M.D.

## 2022-09-17 ENCOUNTER — Other Ambulatory Visit: Payer: Self-pay | Admitting: Internal Medicine

## 2022-09-17 ENCOUNTER — Other Ambulatory Visit (HOSPITAL_COMMUNITY): Payer: Self-pay | Admitting: Psychiatry

## 2022-09-17 DIAGNOSIS — F3181 Bipolar II disorder: Secondary | ICD-10-CM

## 2022-09-17 DIAGNOSIS — F411 Generalized anxiety disorder: Secondary | ICD-10-CM

## 2022-09-17 DIAGNOSIS — F1190 Opioid use, unspecified, uncomplicated: Secondary | ICD-10-CM

## 2022-09-22 ENCOUNTER — Encounter: Payer: Self-pay | Admitting: Neurology

## 2022-09-22 ENCOUNTER — Other Ambulatory Visit: Payer: BC Managed Care – PPO

## 2022-09-22 DIAGNOSIS — Z029 Encounter for administrative examinations, unspecified: Secondary | ICD-10-CM

## 2022-09-23 ENCOUNTER — Telehealth: Payer: Self-pay | Admitting: Neurology

## 2022-09-23 NOTE — Telephone Encounter (Signed)
Pt is needing a call back to reschedule her appt that she had to cancel on last week due to a family emergency.  Pt would like to have a call back.

## 2022-10-01 DIAGNOSIS — Z139 Encounter for screening, unspecified: Secondary | ICD-10-CM | POA: Diagnosis not present

## 2022-10-06 NOTE — Progress Notes (Unsigned)
BH MD/PA/NP OP Progress Note  10/07/2022 9:54 AM Barbara Simon  MRN:  [id number]  Visit Diagnosis:  No diagnosis found.   Assessment: Barbara Simon is a 47 y.o. female with a history of bipolar disorder 2 disorder, opioid use disorder on suboxone maintenance, psychogenic seizures, GAD, and asthma who presented to [facility] at [facility] for initial evaluation on 07/29/2022.  During initial evaluation patient reported symptoms of insomnia, irritability, mood lability, dysphoria, fatigue, weight loss, amotivation, and passive SI without intent or plan.  Safety planning was reviewed and patient is aware of crisis resources.  She does have a past history of hypomania including decreased sleep and increased energy outside of the context of substance use.  Patient does have a significant past history of substance use including cocaine, opiates, marijuana and benzodiazepines.  She denies any cocaine use and opiate use disorder is stable on Suboxone.  Patient does use marijuana regularly and benzodiazepines infrequently.  Education was provided and patient was recommended to discontinue the benzodiazepine and marijuana use.  Patient also is experiencing seizure-like symptoms including tremors, rigidity, disorientation, and intermittent loss of consciousness without associated tongue biting or loss of bowel/bladder control.  Patient meets criteria for bipolar 2 disorder and generalized anxiety disorder.  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    Barbara Simon presents for follow-up evaluation. Today, 10/07/22, patient reports    some improvement in mood lability, irritability, anxiety, and twitches with the initiation of Lamictal and Abilify.  Despite this however anxiety remains a major issue along with the twitching episodes.  She denies any adverse side effects from the medications.  She has connected with a  neurologist who completed an EEG results of which are still pending.  She is also scheduled for a 24-hour EEG in July.  We will increase Lamictal to 100 mg today.  We will also extend short-term disability until after long-term EEG.  Patient to reach out to her neurologist about a note to move from night to day shift.   Plan: - Increase Lamictal to 100 mg daily for bipolar disorder - Continue Abilify 2 mg QD for bipolar disorder - Continue Trazodone 25 mg QHS prn for insomnia - Continue Suboxone 8-2mg  films TID managed by PCP, patient only taking 1 film a day currently - CMP, CBC, alkaline phosphatase, TSH, T4 reviewed - Continue to follow with neurology - Extend short term disability to 09/23/22, patient to contact insurance company to see if paperwork needs to be completed or notes faxed over. - Therapy referral - Crisis resources reviewed - Follow up in a month   Chief Complaint:  Chief Complaint  Patient presents with   Follow-up   HPI: Barbara Simon presents reporting    that the last month has been stressful.  She did notice that after starting Lamictal and Abilify there was improvement in her mood symptoms.  She notes that overall she was less irritable and did not find her mood fluctuating as often.  Anxiety was still high it had reduced some and twitches had improved to a degree.  She does still endorse poor memory/concentration.  Patient had taken the Lamictal regularly and the Abilify she had taken up until the last few days.  She had noted there was an incident where she knocked over the pill container and a few pills all into a dog bowl coming unusable.  With the absence of Abilify last few days she has  noticed her anxiety increased and she is more stressed.  In regards adverse side effects patient reports that she has gained about 6 pounds in the past 2 weeks.  We did review potential adverse side effects and effects of Abilify and weight gain.  Of note patient has been eating a bit  more particularly right before bed.  We reviewed that watching diet and eating earlier in the evening would help with weight symptoms.  Sleep remains an issue though patient notes that she only used trazodone around 13 times this past month.  She denies any side effects and has felt that it helped some, though overall her sleep is still poor.  Of note Barbara Simon endorsed significant anxiety about being over sedated.  She notes that in the past she had been prescribed trazodone 300 which caused her to feel loopy and gain weight.  We spoke about the difference between trazodone 25 and trazodone 300 mg and encouraged her to try to use it more regularly for sleep.  Patient has with her neurologist for her twitches.  She had initial EEG and results are pending from this.  Patient has also been scheduled for 24-hour EEG on 09-22-2022.  Of note patient's short-term disability was only set to go until today.  We agreed to extend it until the completion of her 24-hour EEG.  Patient had also requested a note to work day shift versus nights as this appears to be affecting the seizure episodes.  She was encouraged to reach out to her neurologist to discuss this.  Past Psychiatric History: Information collected from pt, medical record Prior hx "bipolar II". Has had hypomanic but not manic episodes outside of periods of substance abuse.  Patient's manic episodes appear to only occurred in context of substance use.  Patient has 2 prior psychiatric hospitalizations in 2005 and 2010 to [facility].  Has tried Lamictal, Depakote, Seroquel, Abilify, Celexa, Cymbalta, Remeron, trazodone, Xanax  Sober on buprenorphine from opiates. Uses marijuana, home grown. She takes a couple Xanax from her mom when she is overwhelmed. Counseling was provided in regards to both. She denies   Past Medical History:  Past Medical History:  Diagnosis Date   Abnormal Pap smear    LSIL   Anxiety    Asthma    Bipolar 1 disorder (HCC)    Depression     Hepatitis C    two years ago was dx   Ovarian cyst    Seizure (HCC)    Thyrotoxicosis     Past Surgical History:  Procedure Laterality Date   ABDOMINAL SURGERY     APPENDECTOMY     TUBAL LIGATION      Family History:  Family History  Problem Relation Age of Onset   Cancer Maternal Grandmother    Cancer Mother        bladder   Diabetes Mother    Diabetes Brother    Diabetes Sister    Cerebral palsy Daughter    Cancer Maternal Aunt     Social History:  Social History   Socioeconomic History   Marital status: Married    Spouse name: Not on file   Number of children: Not on file   Years of education: Not on file   Highest education level: Not on file  Occupational History   Not on file  Tobacco Use   Smoking status: Some Days    Current packs/day: 0.50    Types: Cigarettes   Smokeless tobacco: Never   Tobacco comments:  1 pack lasts 2 days   Vaping Use   Vaping status: Never Used  Substance and Sexual Activity   Alcohol use: No   Drug use: Yes    Types: Marijuana    Comment: Occasionally.   Sexual activity: Not Currently    Birth control/protection: Surgical    Comment: tubal  Other Topics Concern   Not on file  Social History Narrative   Are you right handed or left handed? right   Are you currently employed ? 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 no   Who lives with you?  family   What type of home do you live in: 1 story or 2 story?  2 story lives up stairs 15-20 steps       Social Determinants of Health   Financial Resource Strain: Low Risk  (09/03/2021)   Received from [facility], [facility]   Overall Financial Resource Strain (CARDIA)    Difficulty of Paying Living Expenses: Not hard at all  Recent Concern: Financial Resource Strain - High Risk (07/16/2021)   Overall Financial Resource Strain (CARDIA)    Difficulty of Paying Living Expenses: Very hard  Food Insecurity: No Food Insecurity  (09/03/2021)   Received from [facility], [facility]   Hunger Vital Sign    Worried About Running Out of Food in the Last Year: Never true    Ran Out of Food in the Last Year: Never true  Recent Concern: Food Insecurity - Food Insecurity Present (07/16/2021)   Hunger Vital Sign    Worried About Running Out of Food in the Last Year: Sometimes true    Ran Out of Food in the Last Year: Often true  Transportation Needs: No Transportation Needs (09/03/2021)   Received from [facility], [facility]   [facility] - Transportation    Lack of Transportation (Medical): No    Lack of Transportation (Non-Medical): No  Physical Activity: Insufficiently Active (07/16/2021)   Exercise Vital Sign    Days of Exercise per Week: 2 days    Minutes of Exercise per Session: 20 min  Stress: Stress Concern Present (07/16/2021)   [employer] of Occupational Health - Occupational Stress Questionnaire    Feeling of Stress : Very much  Social Connections: Socially Integrated (07/16/2021)   Social Connection and Isolation Panel [NHANES]    Frequency of Communication with Friends and Family: More than three times a week    Frequency of Social Gatherings with Friends and Family: More than three times a week    Attends Religious Services: More than 4 times per year    Active Member of [employer] or Organizations: Yes    Attends Banker Meetings: More than 4 times per year    Marital Status: Living with partner    Allergies:  Allergies  Allergen Reactions   Toradol [Ketorolac Tromethamine] Hives and Itching   Adhesive [Tape] Itching and Rash    Current Medications: Current Outpatient Medications  Medication Sig Dispense Refill   acetaminophen (TYLENOL) 500 MG tablet Take 500 mg by mouth every 6 (six) hours as needed for moderate pain.     albuterol (VENTOLIN HFA) 108 (90 Base) MCG/ACT inhaler TAKE 2 PUFFS BY MOUTH EVERY 6 HOURS AS NEEDED FOR WHEEZE OR SHORTNESS OF BREATH 18 each 2    ARIPiprazole (ABILIFY) 2 MG tablet Take 1 tablet (2 mg total) by mouth daily. 30 tablet 1  Aspirin-Acetaminophen-Caffeine (GOODY HEADACHE PO) Take by mouth.     budesonide-formoterol (SYMBICORT) 80-4.5 MCG/ACT inhaler Inhale 2 puffs into the lungs in the morning and at bedtime. Also uses as a rescue inhaler every 4 hours as needed for shortness of breath or wheezing 1 each 12   buprenorphine-naloxone (SUBOXONE) 8-2 mg SUBL SL tablet PLACE 1 TABLET UNDER THE TONGUE 3 (THREE) TIMES DAILY. 90 tablet 0   gabapentin (NEURONTIN) 300 MG capsule TAKE 3 CAPSULES (900MG  TOTAL) BY MOUTH 3 TIMES DAILY 180 capsule 5   ipratropium (ATROVENT) 0.02 % nebulizer solution Take 2.5 mLs (0.5 mg total) by nebulization every 4 (four) hours as needed for wheezing or shortness of breath. 450 mL 0   lamoTRIgine (LAMICTAL) 100 MG tablet Take 1 tablet (100 mg total) by mouth daily. 30 tablet 2   megestrol (MEGACE) 40 MG tablet TAKE 3 TABLETS X 5 DAYS THE 2 X 5 DAYS THEN 1 DAILY TIL BLEEDING STOPS 45 tablet 1   pantoprazole (PROTONIX) 40 MG tablet Take 1 tablet (40 mg total) by mouth daily. 90 tablet 3   polyethylene glycol powder (GLYCOLAX/MIRALAX) 17 GM/SCOOP powder 1-4 scoops daily or as needed 255 g 11   traZODone (DESYREL) 50 MG tablet Take 0.5-1 tablets (25-50 mg total) by mouth at bedtime as needed for sleep (insomnia). 15 tablet 1   No current facility-administered medications for this visit.     Psychiatric Specialty Exam: Review of Systems  There were no vitals taken for this visit.There is no height or weight on file to calculate BMI.  General Appearance: Fairly Groomed  Eye Contact:  Good  Speech:  Clear and Coherent  Volume:  Normal  Mood:  Anxious  Affect:  Congruent  Thought Process:  Coherent  Orientation:  Full (Time, Place, and Person)  Thought Content: Rumination   Suicidal Thoughts:  No  Homicidal Thoughts:  No  Memory:  Immediate;   Fair  Judgement:  Fair  Insight:  Fair  Psychomotor  Activity:   Twitches in her neck and head have been present prior to starting Abilify  Concentration:  Concentration: Fair  Recall:  [employer] of Knowledge: Fair  Language: Good  Akathisia:  No    AIMS (if indicated): not done  Assets:  Communication Skills Desire for Improvement Transportation  ADL's:  Intact  Cognition: WNL  Sleep:  Fair   Metabolic Disorder Labs: Lab Results  Component Value Date   HGBA1C 5.0 08/21/2020   No results found for: "PROLACTIN" No results found for: "CHOL", "TRIG", "HDL", "CHOLHDL", "VLDL", "LDLCALC" Lab Results  Component Value Date   TSH 0.940 04/28/2022   TSH 0.646 07/03/2021    Therapeutic Level Labs: No results found for: "LITHIUM" No results found for: "VALPROATE" No results found for: "CBMZ"   Screenings: GAD-7    Flowsheet Row Office Visit from 08/22/2022 in [facility] Office Visit from [facility]/29/2022 [facility] Office Visit from 07/16/2021 in [facility] Office Visit from 07/03/2021 in [facility] Office Visit from 12/18/2020 in [facility]  Total GAD-7 Score 21 19 20 19 8       PHQ2-9    Flowsheet Row Office Visit from 08/22/2022 in [facility] Office Visit from 07/29/2022 in [facility] Office Visit from 07/22/2022 in [facility] Office Visit from 06/20/2022 in [facility] Office  Visit from 04/14/2022 in [facility]  PHQ-2 Total Score 5 6 6 5 4   PHQ-9 Total Score 22 23 24 20 19       Flowsheet Row Office Visit from 07/29/2022 in [facility] from 07/18/2022 in [facility] ED from 06/27/2021 in [facility] Department at [facility]  C-SSRS RISK CATEGORY Low Risk  Error: Question 6 not populated Error: Question 6 not populated       Collaboration of Care: Collaboration of Care: Medication Management AEB dictation prescription and Other provider involved in patient's care AEB neurology chart review  Patient/Guardian was advised Release of Information must be obtained prior to any record release in order to collaborate their care with an outside provider. Patient/Guardian was advised if they have not already done so to contact the registration department to sign all necessary forms in order for [name] to release information regarding their care.   Consent: Patient/Guardian gives verbal consent for treatment and assignment of benefits for services provided during this visit. Patient/Guardian expressed understanding and agreed to proceed.    Stasia Cavalier, MD 10/07/2022, 9:54 AM  Stasia Cavalier, MD

## 2022-10-07 ENCOUNTER — Telehealth (HOSPITAL_BASED_OUTPATIENT_CLINIC_OR_DEPARTMENT_OTHER): Payer: BC Managed Care – PPO | Admitting: Psychiatry

## 2022-10-07 ENCOUNTER — Encounter (HOSPITAL_COMMUNITY): Payer: Self-pay | Admitting: Psychiatry

## 2022-10-07 ENCOUNTER — Telehealth: Payer: Self-pay | Admitting: *Deleted

## 2022-10-07 DIAGNOSIS — F3181 Bipolar II disorder: Secondary | ICD-10-CM

## 2022-10-07 DIAGNOSIS — F411 Generalized anxiety disorder: Secondary | ICD-10-CM | POA: Diagnosis not present

## 2022-10-07 MED ORDER — ESCITALOPRAM OXALATE 5 MG PO TABS
5.0000 mg | ORAL_TABLET | Freq: Every day | ORAL | 2 refills | Status: DC
Start: 2022-10-07 — End: 2023-01-15

## 2022-10-07 MED ORDER — ARIPIPRAZOLE 2 MG PO TABS: 2.0000 mg | ORAL_TABLET | Freq: Every day | ORAL | 2 refills | Status: DC

## 2022-10-07 NOTE — Progress Notes (Signed)
BH MD/PA/NP OP Progress Note  10/07/2022 10:54 AM Barbara Simon  MRN:  [id number]  Visit Diagnosis:    ICD-10-CM   1. Bipolar 2 disorder (HCC)  F31.81 ARIPiprazole (ABILIFY) 2 MG tablet    escitalopram (LEXAPRO) 5 MG tablet    2. GAD (generalized anxiety disorder)  F41.1 ARIPiprazole (ABILIFY) 2 MG tablet    escitalopram (LEXAPRO) 5 MG tablet      Assessment:  Barbara Simon is a 47 y.o. female with a history of bipolar disorder 2 disorder, opioid use disorder on suboxone maintenance, psychogenic seizures, GAD, and asthma who presented to [facility] at [facility] for initial evaluation on 07/29/2022.  During initial evaluation patient reported symptoms of insomnia, irritability, mood lability, dysphoria, fatigue, weight loss, amotivation, and passive SI without intent or plan.  Safety planning was reviewed and patient is aware of crisis resources.  She does have a past history of hypomania including decreased sleep and increased energy outside of the context of substance use.  Patient does have a significant past history of substance use including cocaine, opiates, marijuana and benzodiazepines.  She denies any cocaine use and opiate use disorder is stable on Suboxone.  Patient does use marijuana regularly and benzodiazepines infrequently.  Education was provided and patient was recommended to discontinue the benzodiazepine and marijuana use.  Patient also is experiencing seizure-like symptoms including tremors, rigidity, disorientation, and intermittent loss of consciousness without associated tongue biting or loss of bowel/bladder control.  Patient meets criteria for bipolar 2 disorder and generalized anxiety disorder.  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    Barbara Simon presents for follow-up evaluation. Today, 10/07/22, patient reports continued improvement in her mood lability, irritability, and  depressive symptoms.  Anxiety and sleep remains a concern.  Patient also continues to struggle with seizure-like episodes which have recurred since restarting work 2 weeks ago.  As patient now is on adequate mood stabilization for her bipolar disorder we will start Lexapro to help manage anxiety symptoms.  Risk and benefits were reviewed.  Options were also discussed for sleep however patient declined at this time due to concerns of over sedation from medication.  Patient did miss 24-hour EEG and was encouraged to reach out to her neurologist to reschedule.  Plan: - Continue Lamictal 100 mg daily for bipolar disorder - Start Lexapro 5 mg daily for anxiety - Continue Abilify 2 mg QD for bipolar disorder - Continue Trazodone 25 mg QHS prn for insomnia - Continue Suboxone 8-2mg  films TID managed by PCP, patient only taking 1 film a day currently - CMP, CBC, alkaline phosphatase, TSH, T4 reviewed - Continue to follow with neurology - Extended short term disability to 09/23/22 - Therapy referral - Crisis resources reviewed - Follow up in a month   Chief Complaint:  Chief Complaint  Patient presents with   Follow-up   HPI: Barbara Simon presents reporting that things are all right.  She went back to work 2 weeks ago and is working the night shift still.  Her neurologist has provided no recommending that she work day shift and patient reports that the job stated that she would switch her once a daytime position becomes available.  Things are going okay to a degree since restarting work however she does continue to have the tremors and jerking sensations while working the night shift.  Of note patient did miss the 24-hour EEG as her son was in a car  accident.  She was encouraged to reach out to her neurologist to reschedule this.  In regards to patient's mood and anxiety she reports that the mood symptoms have seemed to improve.  She finds that her mood been more stable with her experiencing less episodes of  depression or irritability compared to the past.  Attributes this improvement to the medication and denies any adverse side effects from the increase in Lamictal.  She has also taken the Abilify consistently and denies adverse side effects.  As for her anxiety patient reports that this continues to be an issue.  The anxiety is not constant and tends to be more intermittent in nature and can present as restlessness and excessive worry.  This is occasionally related to triggers such as the seizures or concern about work.  We reviewed options for the treatment of this including starting on an SSRI and now that she has adequate mood stabilization.  Patient was open to this and notes having tried Celexa in the past with no notable adverse side effects.  We discussed starting on Lexapro today and reviewed the risk and benefits.  Sleep does continue to be an issue and patient does note that she is taking the trazodone more consistently.  We discussed the possibility of titrating trazodone however she declined that at this time due to concerns about feeling over sedated on the medication.  Past Psychiatric History: Information collected from pt, medical record Prior hx "bipolar II". Has had hypomanic but not manic episodes outside of periods of substance abuse.  Patient's manic episodes appear to only occurred in context of substance use.  Patient has 2 prior psychiatric hospitalizations in 2005 and 2010 to [facility].  Has tried Lamictal, Depakote, Seroquel, Abilify, Celexa, Cymbalta, Remeron, trazodone, Xanax  Sober on buprenorphine from opiates. Uses marijuana, home grown. She takes a couple Xanax from her mom when she is overwhelmed. Counseling was provided in regards to both. She denies   Past Medical History:  Past Medical History:  Diagnosis Date   Abnormal Pap smear    LSIL   Anxiety    Asthma    Bipolar 1 disorder (HCC)    Depression    Hepatitis C    two years ago was dx   Ovarian cyst    Seizure  (HCC)    Thyrotoxicosis     Past Surgical History:  Procedure Laterality Date   ABDOMINAL SURGERY     APPENDECTOMY     TUBAL LIGATION      Family History:  Family History  Problem Relation Age of Onset   Cancer Maternal Grandmother    Cancer Mother        bladder   Diabetes Mother    Diabetes Brother    Diabetes Sister    Cerebral palsy Daughter    Cancer Maternal Aunt     Social History:  Social History   Socioeconomic History   Marital status: Married    Spouse name: Not on file   Number of children: Not on file   Years of education: Not on file   Highest education level: Not on file  Occupational History   Not on file  Tobacco Use   Smoking status: Some Days    Current packs/day: 0.50    Types: Cigarettes   Smokeless tobacco: Never   Tobacco comments:    1 pack lasts 2 days   Vaping Use   Vaping status: Never Used  Substance and Sexual Activity   Alcohol use: No  Drug use: Yes    Types: Marijuana    Comment: Occasionally.   Sexual activity: Not Currently    Birth control/protection: Surgical    Comment: tubal  Other Topics Concern   Not on file  Social History Narrative   Are you right handed or left handed? right   Are you currently employed ? 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 no   Who lives with you?  family   What type of home do you live in: 1 story or 2 story?  2 story lives up stairs 15-20 steps       Social Determinants of Health   Financial Resource Strain: Low Risk  (09/03/2021)   Received from [facility], [facility]   Overall Financial Resource Strain (CARDIA)    Difficulty of Paying Living Expenses: Not hard at all  Recent Concern: Financial Resource Strain - High Risk (07/16/2021)   Overall Financial Resource Strain (CARDIA)    Difficulty of Paying Living Expenses: Very hard  Food Insecurity: No Food Insecurity (09/03/2021)   Received from [facility], [facility]   Hunger  Vital Sign    Worried About Running Out of Food in the Last Year: Never true    Ran Out of Food in the Last Year: Never true  Recent Concern: Food Insecurity - Food Insecurity Present (07/16/2021)   Hunger Vital Sign    Worried About Running Out of Food in the Last Year: Sometimes true    Ran Out of Food in the Last Year: Often true  Transportation Needs: No Transportation Needs (09/03/2021)   [facility] from [facility], [facility], [facility], [facility]   [facility] - Transportation    Lack of Transportation (Medical): No    Lack of Transportation (Non-Medical): No  Physical Activity: Insufficiently Active (07/16/2021)   Exercise Vital Sign    Days of Exercise per Week: 2 days    Minutes of Exercise per Session: 20 min  Stress: Stress Concern Present (07/16/2021)   [employer] of Occupational Health - Occupational Stress Questionnaire    Feeling of Stress : Very much  Social Connections: Socially Integrated (07/16/2021)   Social Connection and Isolation Panel [NHANES]    Frequency of Communication with Friends and Family: More than three times a week    Frequency of Social Gatherings with Friends and Family: More than three times a week    Attends Religious Services: More than 4 times per year    Active Member of [employer] or Organizations: Yes    Attends Banker Meetings: More than 4 times per year    Marital Status: Living with partner    Allergies:  Allergies  Allergen Reactions   Toradol [Ketorolac Tromethamine] Hives and Itching   Adhesive [Tape] Itching and Rash    Current Medications: Current Outpatient Medications  Medication Sig Dispense Refill   escitalopram (LEXAPRO) 5 MG tablet Take 1 tablet (5 mg total) by mouth daily. 30 tablet 2   acetaminophen (TYLENOL) 500 MG tablet Take 500 mg by mouth every 6 (six) hours as needed for moderate pain.     albuterol (VENTOLIN HFA) 108 (90 Base) MCG/ACT inhaler TAKE 2 PUFFS BY MOUTH EVERY 6 HOURS AS NEEDED FOR WHEEZE OR SHORTNESS OF  BREATH 18 each 2   ARIPiprazole (ABILIFY) 2 MG tablet Take 1 tablet (2 mg total) by mouth daily. 30 tablet 2   Aspirin-Acetaminophen-Caffeine (GOODY HEADACHE PO) Take by  mouth.     budesonide-formoterol (SYMBICORT) 80-4.5 MCG/ACT inhaler Inhale 2 puffs into the lungs in the morning and at bedtime. Also uses as a rescue inhaler every 4 hours as needed for shortness of breath or wheezing 1 each 12   buprenorphine-naloxone (SUBOXONE) 8-2 mg SUBL SL tablet PLACE 1 TABLET UNDER THE TONGUE 3 (THREE) TIMES DAILY. 90 tablet 0   gabapentin (NEURONTIN) 300 MG capsule TAKE 3 CAPSULES (900MG  TOTAL) BY MOUTH 3 TIMES DAILY 180 capsule 5   ipratropium (ATROVENT) 0.02 % nebulizer solution Take 2.5 mLs (0.5 mg total) by nebulization every 4 (four) hours as needed for wheezing or shortness of breath. 450 mL 0   lamoTRIgine (LAMICTAL) 100 MG tablet Take 1 tablet (100 mg total) by mouth daily. 30 tablet 2   megestrol (MEGACE) 40 MG tablet TAKE 3 TABLETS X 5 DAYS THE 2 X 5 DAYS THEN 1 DAILY TIL BLEEDING STOPS 45 tablet 1   pantoprazole (PROTONIX) 40 MG tablet Take 1 tablet (40 mg total) by mouth daily. 90 tablet 3   polyethylene glycol powder (GLYCOLAX/MIRALAX) 17 GM/SCOOP powder 1-4 scoops daily or as needed 255 g 11   traZODone (DESYREL) 50 MG tablet Take 0.5-1 tablets (25-50 mg total) by mouth at bedtime as needed for sleep (insomnia). 15 tablet 1   No current facility-administered medications for this visit.     Psychiatric Specialty Exam: Review of Systems  There were no vitals taken for this visit.There is no height or weight on file to calculate BMI.  General Appearance: Casual, Disheveled, and lying in bed  Eye Contact:   Fair to minimal, patient's eyes closed for portion of session  Speech:  Normal Rate  Volume:  Normal  Mood:  Anxious  Affect:  Appropriate and Congruent  Thought Process:  Coherent  Orientation:  Full (Time, Place, and Person)  Thought Content: Logical   Suicidal Thoughts:  No   Homicidal Thoughts:  No  Memory:  Immediate;   Fair  Judgement:  Fair  Insight:  Fair  Psychomotor Activity:  Normal and patient still endorses seizure-like episodes of tremors/twitches, not observed during session today  Concentration:  Concentration: Fair  Recall:  [employer] of Knowledge: Fair  Language: Good  Akathisia:  No    AIMS (if indicated): not done  Assets:  Communication Skills Desire for Improvement Housing Intimacy  ADL's:  Intact  Cognition: WNL  Sleep:  Fair   Metabolic Disorder Labs: Lab Results  Component Value Date   HGBA1C 5.0 08/21/2020   No results found for: "PROLACTIN" No results found for: "CHOL", "TRIG", "HDL", "CHOLHDL", "VLDL", "LDLCALC" Lab Results  Component Value Date   TSH 0.940 04/28/2022   TSH 0.646 07/03/2021    Therapeutic Level Labs: No results found for: "LITHIUM" No results found for: "VALPROATE" No results found for: "CBMZ"   Screenings: GAD-7    Flowsheet Row Office Visit from 08/22/2022 in [facility] Office Visit from [facility] Office Visit from 07/16/2021 in [facility] Office Visit from 07/03/2021 in [facility] Office Visit from 12/18/2020 in [facility]  Total GAD-7 Score 21 19 20 19 8       PHQ2-9    Flowsheet Row Office Visit from 08/22/2022 in [facility] Office Visit from 07[facility]29/2022 in [facility] Office Visit from 07[facility]22/2022 in [facility] Office  Visit from 06/20/2022 in [facility] Office Visit from 04/14/2022 in [facility] Internal Medicine Center  PHQ-2 Total Score 5 6 6 5 4   PHQ-9 Total Score 22 23 24 20 19       Flowsheet Row Office Visit from 07/29/2022 in [facility] ED from 07/18/2022 in  [facility] from 06/27/2021 in [facility] Department at [facility]  C-SSRS RISK CATEGORY Low Risk Error: Question 6 not populated Error: Question 6 not populated       Collaboration of Care: Collaboration of Care: Medication Management AEB medication prescription and Other provider involved in patient's care AEB neurology chart review  Patient/Guardian was advised Release of Information must be obtained prior to any record release in order to collaborate their care with an outside provider. Patient/Guardian was advised if they have not already done so to contact the registration department to sign all necessary forms in order for [name] to release information regarding their care.   Consent: Patient/Guardian gives verbal consent for treatment and assignment of benefits for services provided during this visit. Patient/Guardian expressed understanding and agreed to proceed.    Stasia Cavalier, MD 10/07/2022, 10:54 AM   Virtual Visit via Video Note  I connected with Barbara Simon on 10/07/22 at 10:30 AM EDT by a video enabled telemedicine application and verified that I am speaking with the correct person using two identifiers.  Location: Patient: Home Provider: Home Office   I discussed the limitations of evaluation and management by telemedicine and the availability of in person appointments. The patient expressed understanding and agreed to proceed.   I discussed the assessment and treatment plan with the patient. The patient was provided an opportunity to ask questions and all were answered. The patient agreed with the plan and demonstrated an understanding of the instructions.   The patient was advised to call back or seek an in-person evaluation if the symptoms worsen or if the condition fails to improve as anticipated.  I provided 15 minutes of non-face-to-face time during this encounter.   Stasia Cavalier, MD

## 2022-10-07 NOTE — Telephone Encounter (Signed)
Pt's insurance has approved Megace from 09/01/22-02/26/23. If with the next prescription, it pops up needs PA at pharmacy, pharmacy needs to call [phone]. JSY

## 2022-10-09 ENCOUNTER — Other Ambulatory Visit (HOSPITAL_COMMUNITY): Payer: Self-pay | Admitting: Internal Medicine

## 2022-10-09 ENCOUNTER — Ambulatory Visit (HOSPITAL_COMMUNITY)
Admission: RE | Admit: 2022-10-09 | Discharge: 2022-10-09 | Disposition: A | Discharge status: Home / Self Care | Payer: BC Managed Care – PPO | Source: Ambulatory Visit | Attending: Internal Medicine | Admitting: Internal Medicine

## 2022-10-09 ENCOUNTER — Other Ambulatory Visit: Payer: Self-pay | Admitting: Student

## 2022-10-09 DIAGNOSIS — S6992XA Unspecified injury of left wrist, hand and finger(s), initial encounter: Secondary | ICD-10-CM | POA: Diagnosis not present

## 2022-10-09 DIAGNOSIS — J069 Acute upper respiratory infection, unspecified: Secondary | ICD-10-CM

## 2022-10-09 DIAGNOSIS — J4530 Mild persistent asthma, uncomplicated: Secondary | ICD-10-CM

## 2022-10-09 DIAGNOSIS — J302 Other seasonal allergic rhinitis: Secondary | ICD-10-CM | POA: Diagnosis not present

## 2022-10-10 NOTE — Telephone Encounter (Signed)
Call to patient to confirm if she is an [facility] patent.  Unable to reach or leave a message.

## 2022-10-14 ENCOUNTER — Telehealth: Payer: Self-pay | Admitting: *Deleted

## 2022-10-15 ENCOUNTER — Encounter (HOSPITAL_COMMUNITY): Payer: Self-pay

## 2022-10-15 ENCOUNTER — Telehealth (HOSPITAL_COMMUNITY): Payer: Self-pay | Admitting: *Deleted

## 2022-10-15 NOTE — Telephone Encounter (Signed)
She has tried both a.m. and p.m., as well as with food. Pt advised to hold medication until next appointment (11/21/22). Pt verbalizes understanding and agrees. Pt encouraged to call us with any further questions or concerns.

## 2022-10-15 NOTE — Telephone Encounter (Signed)
Pt called to update progress after starting Lexapro 5 mg ordered 10/07/22 during most recent visit. Pt says that the medication is making her sedated and barely able to get up for work. Also c/o of nausea and feeling like she has "air in my head". Pt next visit scheduled for 11/21/22. Please review.

## 2022-10-16 ENCOUNTER — Other Ambulatory Visit: Payer: Self-pay | Admitting: Adult Health

## 2022-10-20 ENCOUNTER — Other Ambulatory Visit: Payer: Self-pay | Admitting: Internal Medicine

## 2022-10-20 DIAGNOSIS — F1190 Opioid use, unspecified, uncomplicated: Secondary | ICD-10-CM

## 2022-10-22 NOTE — Telephone Encounter (Signed)
Pt calling back to f/u with the following prescription request.    buprenorphine-naloxone (SUBOXONE) 8-2 mg SUBL SL tablet   [id number] - [place], [place] - [address] AT [place] [address]

## 2022-10-22 NOTE — Telephone Encounter (Signed)
Last rx written - 09/19/22. Last OV - 08/22/22. Next OV - has not been scheduled. TOX - 04/14/22.

## 2022-10-22 NOTE — Telephone Encounter (Signed)
Called pt - no answer; "call cannot be completed at this time".

## 2022-10-22 NOTE — Telephone Encounter (Signed)
Discussed with Barbara Simon. Since last [facility] visit, she is planning to transfer primary care closer to home but would like to continue receiving her OUD treatment through [facility]. She has searched for an option closer to home but has not yet found an OUD provider. I will discuss this situation with our clinic medical director and schedule an appointment with the resident team in two weeks to review options. PDMP reviewed and appropriate. Refill of Suboxone sent today.

## 2022-11-05 ENCOUNTER — Encounter: Payer: BC Managed Care – PPO | Admitting: Internal Medicine

## 2022-11-19 ENCOUNTER — Telehealth: Payer: Self-pay | Admitting: Internal Medicine

## 2022-11-19 DIAGNOSIS — F1190 Opioid use, unspecified, uncomplicated: Secondary | ICD-10-CM

## 2022-11-19 MED ORDER — BUPRENORPHINE HCL-NALOXONE HCL 8-2 MG SL SUBL
1.0000 | SUBLINGUAL_TABLET | Freq: Three times a day (TID) | SUBLINGUAL | 0 refills | Status: DC
Start: 2022-11-21 — End: 2022-11-24

## 2022-11-19 NOTE — Telephone Encounter (Signed)
Rec'd call from the ptient stating she would like for a Dr. to call her back.  The pt has cancelled the following  appts for her  medication refills.  The pt states she can get off from work because will get a write up. Pt states she needs to know what to do about getting her refills.  Name: Barbara Simon, Barbara Simon MRN: [id number]  Date: 11/20/2022 Status: Can  Time: 10:45 AM Length: 30  Visit Type: OPEN ESTABLISHED [726] Copay: $25.00  Provider: Modena Slater, DO      Name: Barbara Simon, Barbara Simon MRN: [id number]  Date: 11/05/2022 Status: Can  Time: 3:45 PM Length: 30  Visit Type: OPEN ESTABLISHED [726] Copay: $25.00  Provider: Champ Mungo, DO

## 2022-11-19 NOTE — Telephone Encounter (Signed)
Called and spoke with patient discussed we will continue to see her for her Suboxone.  She needs a 345 appointment and could not make this appointment last minute she will need to give her job some advance notice.  I have sent her in another month front desk could you please have her scheduled for a 345 slot for OUD follow-up at some point in the next 4 weeks or so.

## 2022-11-20 ENCOUNTER — Encounter: Payer: BC Managed Care – PPO | Admitting: Student

## 2022-11-21 ENCOUNTER — Telehealth (HOSPITAL_COMMUNITY): Payer: BC Managed Care – PPO | Admitting: Psychiatry

## 2022-11-24 ENCOUNTER — Other Ambulatory Visit: Payer: Self-pay

## 2022-11-24 DIAGNOSIS — F1190 Opioid use, unspecified, uncomplicated: Secondary | ICD-10-CM

## 2022-11-25 ENCOUNTER — Other Ambulatory Visit: Payer: Self-pay | Admitting: *Deleted

## 2022-11-25 ENCOUNTER — Other Ambulatory Visit (HOSPITAL_COMMUNITY): Payer: Self-pay

## 2022-11-25 MED ORDER — BUPRENORPHINE HCL-NALOXONE HCL 8-2 MG SL SUBL
1.0000 | SUBLINGUAL_TABLET | Freq: Three times a day (TID) | SUBLINGUAL | 0 refills | Status: AC
Start: 2022-11-25 — End: ?
  Filled 2022-11-25: qty 90, 30d supply, fill #0

## 2022-11-25 NOTE — Telephone Encounter (Signed)
Can you please call the pharmacy and make sure the previous Rx is canceled.  New Rx sent to [facility] under IM Program

## 2022-12-01 ENCOUNTER — Telehealth: Payer: Self-pay | Admitting: *Deleted

## 2022-12-01 ENCOUNTER — Other Ambulatory Visit (HOSPITAL_COMMUNITY): Payer: Self-pay

## 2022-12-01 NOTE — Telephone Encounter (Signed)
Received a call from pt who stated [facility] pharmacy is no longer covering Suboxone and it will cost $192.00. stated she's having problems with her husband's insurance. I called [facility] pharmacy - stated Suboxone is no longer on the IM Program. Stated several meds have been removed from the IM Program. Stated it will cost pt $104.00 for 90 tabs. I called the pt back to let her know what the [facility] pharmacy stated. Stated she's hoping to get the insurance fix. But in the meantime, if she has to pay $104, she will try to borrow the money and stated it's better than paying $192.00.

## 2022-12-02 ENCOUNTER — Other Ambulatory Visit (HOSPITAL_COMMUNITY): Payer: Self-pay

## 2022-12-12 ENCOUNTER — Other Ambulatory Visit (HOSPITAL_COMMUNITY): Payer: Self-pay | Admitting: Psychiatry

## 2022-12-12 DIAGNOSIS — F3181 Bipolar II disorder: Secondary | ICD-10-CM

## 2022-12-12 DIAGNOSIS — F411 Generalized anxiety disorder: Secondary | ICD-10-CM

## 2022-12-15 ENCOUNTER — Ambulatory Visit (INDEPENDENT_AMBULATORY_CARE_PROVIDER_SITE_OTHER): Payer: Self-pay | Admitting: Internal Medicine

## 2022-12-15 DIAGNOSIS — F1190 Opioid use, unspecified, uncomplicated: Secondary | ICD-10-CM

## 2022-12-15 MED ORDER — BUPRENORPHINE HCL-NALOXONE HCL 8-2 MG SL SUBL
1.0000 | SUBLINGUAL_TABLET | Freq: Three times a day (TID) | SUBLINGUAL | 0 refills | Status: DC
Start: 2022-12-25 — End: 2023-01-16

## 2022-12-15 NOTE — Patient Instructions (Signed)
Thank you, Ms.Jocelyne H Macdowell for allowing [name] to provide your care today. Today we discussed:  Suboxone: continue to take your suboxone 3 times a day Follow up in 3 months for an in-person visit   I have ordered the following labs for you:  Lab Orders  No laboratory test(s) ordered today      Referrals ordered today:   Referral Orders  No referral(s) requested today     I have ordered the following medication/changed the following medications:   Stop the following medications: Medications Discontinued During This Encounter  Medication Reason   buprenorphine-naloxone (SUBOXONE) 8-2 mg SUBL SL tablet Reorder     Start the following medications: Meds ordered this encounter  Medications   buprenorphine-naloxone (SUBOXONE) 8-2 mg SUBL SL tablet    Sig: Place 1 tablet under the tongue 3 (three) times daily.    Dispense:  90 tablet    Refill:  0    IM Program     Follow up: 3 months     Should you have any questions or concerns please call the internal medicine clinic at [phone].     Elza Rafter, D.O. [facility]

## 2022-12-15 NOTE — Progress Notes (Signed)
CC: OUD follow up  HPI:  Barbara Simon is a 47 y.o. female living with a history stated below and presents today for follow up of her OUD. Please see problem based assessment and plan for additional details.  Past Medical History:  Diagnosis Date   Abnormal Pap smear    LSIL   Anxiety    Asthma    Bipolar 1 disorder (HCC)    Depression    Hepatitis C    two years ago was dx   Ovarian cyst    Seizure (HCC)    Thyrotoxicosis     Current Outpatient Medications on File Prior to Visit  Medication Sig Dispense Refill   acetaminophen (TYLENOL) 500 MG tablet Take 500 mg by mouth every 6 (six) hours as needed for moderate pain.     albuterol (VENTOLIN HFA) 108 (90 Base) MCG/ACT inhaler TAKE 2 PUFFS BY MOUTH EVERY 6 HOURS AS NEEDED FOR WHEEZE OR SHORTNESS OF BREATH 18 each 2   ARIPiprazole (ABILIFY) 2 MG tablet Take 1 tablet (2 mg total) by mouth daily. 30 tablet 2   Aspirin-Acetaminophen-Caffeine (GOODY HEADACHE PO) Take by mouth.     budesonide-formoterol (SYMBICORT) 80-4.5 MCG/ACT inhaler Inhale 2 puffs into the lungs in the morning and at bedtime. Also uses as a rescue inhaler every 4 hours as needed for shortness of breath or wheezing 1 each 12   escitalopram (LEXAPRO) 5 MG tablet Take 1 tablet (5 mg total) by mouth daily. 30 tablet 2   gabapentin (NEURONTIN) 300 MG capsule TAKE 3 CAPSULES (900MG  TOTAL) BY MOUTH 3 TIMES DAILY 180 capsule 5   ipratropium (ATROVENT) 0.02 % nebulizer solution USE 1 VIAL IN NEBULIZER EVERY 4 HOURS AS NEEDED FOR WHEEZING OR SHORTNESS OF BREATH 125 mL 3   lamoTRIgine (LAMICTAL) 100 MG tablet TAKE 1 TABLET BY MOUTH EVERY DAY 90 tablet 0   megestrol (MEGACE) 40 MG tablet TAKE 3 TABLETS X 5 DAYS THE 2 X 5 DAYS THEN 1 DAILY TIL BLEEDING STOPS 135 tablet 1   pantoprazole (PROTONIX) 40 MG tablet Take 1 tablet (40 mg total) by mouth daily. 90 tablet 3   polyethylene glycol powder (GLYCOLAX/MIRALAX) 17 GM/SCOOP powder 1-4 scoops daily or as needed 255 g 11    traZODone (DESYREL) 50 MG tablet Take 0.5-1 tablets (25-50 mg total) by mouth at bedtime as needed for sleep (insomnia). 15 tablet 1   No current facility-administered medications on file prior to visit.    Family History  Problem Relation Age of Onset   Cancer Maternal Grandmother    Cancer Mother        bladder   Diabetes Mother    Diabetes Brother    Diabetes Sister    Cerebral palsy Daughter    Cancer Maternal Aunt     Social History   Socioeconomic History   Marital status: Married    Spouse name: Not on file   Number of children: Not on file   Years of education: Not on file   Highest education level: Not on file  Occupational History   Not on file  Tobacco Use   Smoking status: Some Days    Current packs/day: 0.50    Types: Cigarettes   Smokeless tobacco: Never   Tobacco comments:    1 pack lasts 2 days   Vaping Use   Vaping status: Never Used  Substance and Sexual Activity   Alcohol use: No   Drug use: Yes    Types: Marijuana  Comment: Occasionally.   Sexual activity: Not Currently    Birth control/protection: Surgical    Comment: tubal  Other Topics Concern   Not on file  Social History Narrative   Are you right handed or left handed? right   Are you currently employed ? 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 no   Who lives with you?  family   What type of home do you live in: 1 story or 2 story?  2 story lives up stairs 15-20 steps       Social Determinants of Health   Financial Resource Strain: Low Risk  (09/03/2021)   Received [facility]   Overall Financial Resource Strain (CARDIA)    Difficulty of Paying Living Expenses: Not hard at all  Recent Concern: Financial Resource Strain - High Risk (07/16/2021)   Overall Financial Resource Strain (CARDIA)    Difficulty of Paying Living Expenses: Very hard  Food Insecurity: No Food Insecurity (09/03/2021)   Received from [facility]   Hunger Vital Sign    Worried About Running Out of Food in the Last Year: Never true    Ran Out of Food in the Last Year: Never true  Recent Concern: Food Insecurity - Food Insecurity Present (07/16/2021)   Hunger Vital Sign    Worried About Running Out of Food in the Last Year: Sometimes true    Ran Out of Food in the Last Year: Often true  Transportation Needs: No Transportation Needs (09/03/2021)   Received from [facility]   [facility] - Transportation    Lack of Transportation (Medical): No    Lack of Transportation (Non-Medical): No  Physical Activity: Insufficiently Active (07/16/2021)   Exercise Vital Sign    Days of Exercise per Week: 2 days    Minutes of Exercise per Session: 20 min  Stress: Stress Concern Present (07/16/2021)   [employer] of Occupational Health - Occupational Stress Questionnaire    Feeling of Stress : Very much  Social Connections: Socially Integrated (07/16/2021)   Social Connection and Isolation Panel [NHANES]    Frequency of Communication with Friends and Family: More than three times a week    Frequency of Social Gatherings with Friends and Family: More than three times a week    Attends Religious Services: More than 4 times per year    Active Member of [employer] or Organizations: Yes    Attends Banker Meetings: More than 4 times per year    Marital Status: Living with partner  Intimate Partner Violence: Not At Risk (07/16/2021)   Humiliation, Afraid, Rape, and Kick questionnaire    Fear of Current or Ex-Partner: No    Emotionally Abused: No    Physically Abused: No    Sexually Abused: No    Review of Systems: ROS negative except for what is noted on the assessment and plan.  Vitals:   12/15/22 1552  BP: 116/68  Pulse: 80  Temp: 98.5 F (36.9 C)  TempSrc: Oral  SpO2: 95%  Weight: 174 lb 4.8 oz (79.1 kg)  Height: 5\' 3"  (1.6 m)    Physical Exam: Constitutional: well-appearing middle aged female  sitting in chair, in no acute distress Cardiovascular: regular rate and rhythm, no m/r/g Pulmonary/Chest: normal work of breathing on room air, lungs clear to auscultation bilaterally MSK: normal bulk and tone Skin: warm and dry Psych:  normal mood and behavior  Assessment & Plan:    Patient discussed with Dr. Antony Contras  Opioid use disorder Ms. Snowden is a 47 year old female with opioid use disorder on Suboxone 8-2 mg 3 times daily.  Of note, she lives in [place], [place] and has switched to a primary care physician that is closer to her home, but she will continue to follow with the [facility] for her opioid use disorder.  She is doing well overall and is not having any cravings or relapses.  Her tox assure this year was appropriate and there is not a need to repeat it at this time.  Suboxone last picked up on 9/10.   Plan: - Refill suboxone to pick up on 10/10 - Follow up in 3 months and would do ToxAssure at that time (if doing well, can do telehealth at the next office visit)   Blair Mesina, D.O. [facility] Internal Medicine, PGY-3 Phone: [phone] Date 12/15/2022 Time 4:18 PM

## 2022-12-15 NOTE — Assessment & Plan Note (Signed)
Barbara Simon is a 46 year old female with opioid use disorder on Suboxone 8-2 mg 3 times daily.  Of note, she lives in [place], [place] and has switched to a primary care physician that is closer to her home, but she will continue to follow with the [facility] for her opioid use disorder.  She is doing well overall and is not having any cravings or relapses.  Her tox assure this year was appropriate and there is not a need to repeat it at this time.  Suboxone last picked up on 9/10.   Plan: - Refill suboxone to pick up on 10/10 - Follow up in 3 months and would do ToxAssure at that time (if doing well, can do telehealth at the next office visit)

## 2022-12-16 NOTE — Progress Notes (Signed)
Internal Medicine Clinic Attending  Case discussed with the resident at the time of the visit.  We reviewed the resident's history and exam and pertinent patient test results.  I agree with the assessment, diagnosis, and plan of care documented in the resident's note.  

## 2023-01-15 ENCOUNTER — Telehealth (HOSPITAL_COMMUNITY): Payer: Self-pay | Admitting: Psychiatry

## 2023-01-15 ENCOUNTER — Telehealth (HOSPITAL_BASED_OUTPATIENT_CLINIC_OR_DEPARTMENT_OTHER): Payer: Self-pay | Admitting: Psychiatry

## 2023-01-15 ENCOUNTER — Encounter (HOSPITAL_COMMUNITY): Payer: Self-pay | Admitting: Psychiatry

## 2023-01-15 ENCOUNTER — Telehealth: Payer: Self-pay | Admitting: General Practice

## 2023-01-15 DIAGNOSIS — F3181 Bipolar II disorder: Secondary | ICD-10-CM

## 2023-01-15 DIAGNOSIS — F411 Generalized anxiety disorder: Secondary | ICD-10-CM

## 2023-01-15 MED ORDER — ARIPIPRAZOLE 2 MG PO TABS
2.0000 mg | ORAL_TABLET | Freq: Every day | ORAL | 2 refills | Status: DC
Start: 2023-01-15 — End: 2023-02-19

## 2023-01-15 MED ORDER — VENLAFAXINE HCL ER 37.5 MG PO CP24: ORAL_CAPSULE | ORAL | 1 refills | Status: DC

## 2023-01-15 NOTE — Telephone Encounter (Signed)
[facility] who stated DEA# is not on rx; I asked her to check again. She checked; she found it and stated she will get rx filled for pt. Pt was called and informed.

## 2023-01-15 NOTE — Progress Notes (Signed)
BH MD/PA/NP OP Progress Note  01/15/2023 4:21 PM Barbara Simon  MRN:  [id number]  Visit Diagnosis:    ICD-10-CM   1. Bipolar 2 disorder (HCC)  F31.81 venlafaxine XR (EFFEXOR-XR) 37.5 MG 24 hr capsule    ARIPiprazole (ABILIFY) 2 MG tablet    2. GAD (generalized anxiety disorder)  F41.1 venlafaxine XR (EFFEXOR-XR) 37.5 MG 24 hr capsule    ARIPiprazole (ABILIFY) 2 MG tablet      Assessment:  Barbara Simon is a 47 y.o. female with a history of bipolar disorder 2 disorder, opioid use disorder on suboxone maintenance, psychogenic seizures, GAD, and asthma who presented to [facility] for initial evaluation on 07/29/2022.  During initial evaluation patient reported symptoms of insomnia, irritability, mood lability, dysphoria, fatigue, weight loss, amotivation, and passive SI without intent or plan.  Safety planning was reviewed and patient is aware of crisis resources.  She does have a past history of hypomania including decreased sleep and increased energy outside of the context of substance use.  Patient does have a significant past history of substance use including cocaine, opiates, marijuana and benzodiazepines.  She denies any cocaine use and opiate use disorder is stable on Suboxone.  Patient does use marijuana regularly and benzodiazepines infrequently.  Education was provided and patient was recommended to discontinue the benzodiazepine and marijuana use.  Patient also is experiencing seizure-like symptoms including tremors, rigidity, disorientation, and intermittent loss of consciousness without associated tongue biting or loss of bowel/bladder control.  Patient meets criteria for bipolar 2 disorder and generalized anxiety disorder.  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    Barbara Simon presents for follow-up evaluation. Today, 01/15/23, patient reports worsening depression in the  interim due to increased financial stressors and isolation.  Patient had left her job due to their inability to make the recommended accommodations.  While this removed some stressors that allow he lack of structure to lead to worsening depression.  That said we did discuss some behavioral activation techniques to help improve depressive symptoms and rebuild structure into her day.  Medication wise patient had adverse side effects of nausea and fatigue on the Lexapro plus it was discontinued.  We will start venlafaxine and titrate to 75 mg over the next 7 days.  Risks and benefits of this medication were discussed.  We will continue Abilify and Lamictal at current doses.  While we will discontinue trazodone due to an improvement of insomnia without it.  Plan: - Continue Lamictal 100 mg daily for bipolar disorder - Start Venlafaxine 37.5 mg daily for 7 days before increasing to 75 mg daily - Continue Abilify 2 mg QD for bipolar disorder - Discontinue Lexapro 5 mg daily for anxiety - Discontinue Trazodone 25 mg QHS prn for insomnia - Continue Suboxone 8-2mg  films TID managed by PCP, patient only taking 1 film a day currently, could consider Sublocade - CMP, CBC, alkaline phosphatase, TSH, T4 reviewed - Continue to follow with neurology - Therapy referral - Crisis resources reviewed - Follow up in a month  Chief Complaint:  Chief Complaint  Patient presents with   Follow-up   HPI: Barbara Simon presents reporting that a lot has gone on the past few months.  Her job did not accommodate the recommendation for her to be switched to day shift reportedly due to lack of availability.  This resulted in Barbara Simon leaving the job 2 months ago.  Initially her partner  had encouraged her to take some time off to focus on improving her mental health.  However as time is going on Barbara Simon bottom herself feeling more depressed and anxious.  She describes having difficulty with motivation, anhedonia, feelings of  worthlessness, dysregulated sleep schedule, and increased anxiety.  Patient finds it difficult to leave the house unless she is with somebody else.  We reviewed how the isolation can further impact symptoms of depression and anxiety which patient acknowledged.  Discussed behavioral activation techniques that could be used to help improve depression.  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  There are some occasional twitches in her hand however she has not experienced any other symptoms.  Medication wise she had started the Lexapro however felt nauseous and a feeling of heaviness in her head on it.  She had reached out and was recommended to discontinue the medication.  We discussed options today to help better manage the anxiety and depressive symptoms with venlafaxine being reviewed.  Risk and benefits of this medication were discussed.  Of note due to the loss of her job patient does not have insurance and finances are tight.  Thus she will reach out if venlafaxine is not affordable.  Past Psychiatric History: Information collected from pt, medical record Prior hx "bipolar II". Has had hypomanic but not manic episodes outside of periods of substance abuse.  Patient's manic episodes appear to only occurred in context of substance use.  Patient has 2 prior psychiatric hospitalizations in 2005 and 2010 to [facility]  Has tried Lamictal, Depakote, Seroquel, Abilify, Celexa, Cymbalta, Remeron, trazodone, Xanax  Sober on buprenorphine from opiates. Uses marijuana, home grown. She takes a couple Xanax from her mom when she is overwhelmed. Counseling was provided in regards to both. She denies   Past Medical History:  Past Medical History:  Diagnosis Date   Abnormal Pap smear    LSIL   Anxiety    Asthma    Bipolar 1 disorder (HCC)    Depression    Hepatitis C    two years ago was dx   Ovarian cyst    Seizure (HCC)     Thyrotoxicosis     Past Surgical History:  Procedure Laterality Date   ABDOMINAL SURGERY     APPENDECTOMY     TUBAL LIGATION      Family History:  Family History  Problem Relation Age of Onset   Cancer Maternal Grandmother    Cancer Mother        bladder   Diabetes Mother    Diabetes Brother    Diabetes Sister    Cerebral palsy Daughter    Cancer Maternal Aunt     Social History:  Social History   Socioeconomic History   Marital status: Married    Spouse name: Not on file   Number of children: Not on file   Years of education: Not on file   Highest education level: Not on file  Occupational History   Not on file  Tobacco Use   Smoking status: Some Days    Current packs/day: 0.50    Types: Cigarettes   Smokeless tobacco: Never   Tobacco comments:    1 pack lasts 2 days   Vaping Use   Vaping status: Never Used  Substance and Sexual Activity   Alcohol use: No   Drug use: Yes    Types: Marijuana    Comment: Occasionally.  Sexual activity: Not Currently    Birth control/protection: Surgical    Comment: tubal  Other Topics Concern   Not on file  Social History Narrative   Are you right handed or left handed? right   Are you currently employed ? 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 no   Who lives with you?  family   What type of home do you live in: 1 story or 2 story?  2 story lives up stairs 15-20 steps       Social Determinants of Health   Financial Resource Strain: Low Risk  (09/03/2021)   Received from [facility], [facility]   Overall Financial Resource Strain (CARDIA)    Difficulty of Paying Living Expenses: Not hard at all  Recent Concern: Financial Resource Strain - High Risk (07/16/2021)   Overall Financial Resource Strain (CARDIA)    Difficulty of Paying Living Expenses: Very hard  Food Insecurity: No Food Insecurity (09/03/2021)   Received from [facility], [facility]   Hunger Vital Sign     Worried About Running Out of Food in the Last Year: Never true    Ran Out of Food in the Last Year: Never true  Recent Concern: Food Insecurity - Food Insecurity Present (07/16/2021)   Hunger Vital Sign    Worried About Running Out of Food in the Last Year: Sometimes true    Ran Out of Food in the Last Year: Often true  Transportation Needs: No Transportation Needs (09/03/2021)   Received from [facility], [facility]   [facility] - Transportation    Lack of Transportation (Medical): No    Lack of Transportation (Non-Medical): No  Physical Activity: Insufficiently Active (07/16/2021)   Exercise Vital Sign    Days of Exercise per Week: 2 days    Minutes of Exercise per Session: 20 min  Stress: Stress Concern Present (07/16/2021)   [employer] of Occupational Health - Occupational Stress Questionnaire    Feeling of Stress : Very much  Social Connections: Socially Integrated (07/16/2021)   Social Connection and Isolation Panel [NHANES]    Frequency of Communication with Friends and Family: More than three times a week    Frequency of Social Gatherings with Friends and Family: More than three times a week    Attends Religious Services: More than 4 times per year    Active Member of [employer] or Organizations: Yes    Attends Banker Meetings: More than 4 times per year    Marital Status: Living with partner    Allergies:  Allergies  Allergen Reactions   Toradol [Ketorolac Tromethamine] Hives and Itching   Adhesive [Tape] Itching and Rash    Current Medications: Current Outpatient Medications  Medication Sig Dispense Refill   venlafaxine XR (EFFEXOR-XR) 37.5 MG 24 hr capsule Take 1 capsule (37.5 mg total) by mouth daily with breakfast for 7 days, THEN 2 capsules (75 mg total) daily with breakfast for 23 days. 53 capsule 1   acetaminophen (TYLENOL) 500 MG tablet Take 500 mg by mouth every 6 (six) hours as needed for moderate pain.     albuterol (VENTOLIN HFA) 108 (90  Base) MCG/ACT inhaler TAKE 2 PUFFS BY MOUTH EVERY 6 HOURS AS NEEDED FOR WHEEZE OR SHORTNESS OF BREATH 18 each 2   ARIPiprazole (ABILIFY) 2 MG tablet Take 1 tablet (2 mg total) by mouth daily. 30 tablet 2   Aspirin-Acetaminophen-Caffeine (  GOODY HEADACHE PO) Take by mouth.     budesonide-formoterol (SYMBICORT) 80-4.5 MCG/ACT inhaler Inhale 2 puffs into the lungs in the morning and at bedtime. Also uses as a rescue inhaler every 4 hours as needed for shortness of breath or wheezing 1 each 12   buprenorphine-naloxone (SUBOXONE) 8-2 mg SUBL SL tablet Place 1 tablet under the tongue 3 (three) times daily. 90 tablet 0   gabapentin (NEURONTIN) 300 MG capsule TAKE 3 CAPSULES (900MG  TOTAL) BY MOUTH 3 TIMES DAILY 180 capsule 5   ipratropium (ATROVENT) 0.02 % nebulizer solution USE 1 VIAL IN NEBULIZER EVERY 4 HOURS AS NEEDED FOR WHEEZING OR SHORTNESS OF BREATH 125 mL 3   lamoTRIgine (LAMICTAL) 100 MG tablet TAKE 1 TABLET BY MOUTH EVERY DAY 90 tablet 0   megestrol (MEGACE) 40 MG tablet TAKE 3 TABLETS X 5 DAYS THE 2 X 5 DAYS THEN 1 DAILY TIL BLEEDING STOPS 135 tablet 1   pantoprazole (PROTONIX) 40 MG tablet Take 1 tablet (40 mg total) by mouth daily. 90 tablet 3   polyethylene glycol powder (GLYCOLAX/MIRALAX) 17 GM/SCOOP powder 1-4 scoops daily or as needed 255 g 11   No current facility-administered medications for this visit.     Psychiatric Specialty Exam: Review of Systems  There were no vitals taken for this visit.There is no height or weight on file to calculate BMI.  General Appearance: Casual  Eye Contact:  Fair  Speech:  Normal Rate  Volume:  Normal  Mood:  Depressed  Affect:  Appropriate and Congruent  Thought Process:  Coherent  Orientation:  Full (Time, Place, and Person)  Thought Content: Logical   Suicidal Thoughts:  No  Homicidal Thoughts:  No  Memory:  Immediate;   Fair  Judgement:  Fair  Insight:  Fair  Psychomotor Activity:  Decreased  Concentration:  Concentration: Fair   Recall:  Fair  Fund of Knowledge: Fair  Language: Good  Akathisia:  No    AIMS (if indicated): not done  Assets:  Communication Skills Desire for Improvement Housing Intimacy  ADL's:  Intact  Cognition: WNL  Sleep:  Fair   Metabolic Disorder Labs: Lab Results  Component Value Date   HGBA1C 5.0 08/21/2020   No results found for: "PROLACTIN" No results found for: "CHOL", "TRIG", "HDL", "CHOLHDL", "VLDL", "LDLCALC" Lab Results  Component Value Date   TSH 0.940 04/28/2022   TSH 0.646 07/03/2021    Therapeutic Level Labs: No results found for: "LITHIUM" No results found for: "VALPROATE" No results found for: "CBMZ"   Screenings: GAD-7    Flowsheet Row Office Visit from 08/22/2022 in [facility] Office Visit from [facility]/2022 in [facility] Office Visit from 07/16/2021 in [facility] Office Visit from 07/03/2021 in [facility] Office Visit from 12/18/2020 in [facility]  Total GAD-7 Score 21 19 20 19 8       PHQ2-9    Flowsheet Row Office Visit from 08/22/2022 in [facility] Office Visit from 07/29/2022 in [facility] Office Visit from 07/22/2022 in [facility] Office Visit from 06/20/2022 in [facility] Center Office Visit from 04/14/2022 in [facility] Center  PHQ-2 Total Score 5 6 6 5 4   PHQ-9 Total Score 22 23 24 20 19       Flowsheet Row Office Visit from 07/29/2022 in [facility]  ED from 07/18/2022 in [facility] ED from 06/27/2021 in [facility] Emergency Department at [facility]  C-SSRS RISK CATEGORY Low Risk Error: Question 6 not populated Error: Question 6 not populated       Collaboration of Care: Collaboration of Care:  Medication Management AEB medication prescription and Other provider involved in patient's care AEB PCP chart review  Patient/Guardian was advised Release of Information must be obtained prior to any record release in order to collaborate their care with an outside provider. Patient/Guardian was advised if they have not already done so to contact the registration department to sign all necessary forms in order for [name] to release information regarding their care.   Consent: Patient/Guardian gives verbal consent for treatment and assignment of benefits for services provided during this visit. Patient/Guardian expressed understanding and agreed to proceed.    Stasia Cavalier, MD 01/15/2023, 4:21 PM   Virtual Visit via Video Note  I connected with Barbara Simon on 01/15/23 at  3:30 PM EDT by a video enabled telemedicine application and verified that I am speaking with the correct person using two identifiers.  Location: Patient: Home Provider: Home Office   I discussed the limitations of evaluation and management by telemedicine and the availability of in person appointments. The patient expressed understanding and agreed to proceed.   I discussed the assessment and treatment plan with the patient. The patient was provided an opportunity to ask questions and all were answered. The patient agreed with the plan and demonstrated an understanding of the instructions.   The patient was advised to call back or seek an in-person evaluation if the symptoms worsen or if the condition fails to improve as anticipated.  I provided 25 minutes of non-face-to-face time during this encounter.   Stasia Cavalier, MD

## 2023-01-15 NOTE — Telephone Encounter (Signed)
Pt unable to pick up the following medication due to no DEA on file for the ordering PCP.  buprenorphine-naloxone (SUBOXONE) 8-2 mg SUBL SL tablet   [id number] [place], [place] - [address] AT [place] [address]

## 2023-01-16 ENCOUNTER — Other Ambulatory Visit: Payer: Self-pay | Admitting: *Deleted

## 2023-01-16 DIAGNOSIS — F1190 Opioid use, unspecified, uncomplicated: Secondary | ICD-10-CM

## 2023-01-16 MED ORDER — BUPRENORPHINE HCL-NALOXONE HCL 8-2 MG SL SUBL
1.0000 | SUBLINGUAL_TABLET | Freq: Three times a day (TID) | SUBLINGUAL | 0 refills | Status: DC
Start: 2023-01-16 — End: 2023-02-19

## 2023-01-16 NOTE — Telephone Encounter (Signed)
Patient was called and informed that a new prescription has been sent to her Pharmacy for the Suboxone.

## 2023-02-18 NOTE — Progress Notes (Unsigned)
BH MD/PA/NP OP Progress Note  02/19/2023 9:00 AM Barbara Simon  MRN:  [id number]  Visit Diagnosis:    ICD-10-CM   1. Bipolar 2 disorder (HCC)  F31.81 venlafaxine XR (EFFEXOR-XR) 150 MG 24 hr capsule    venlafaxine XR (EFFEXOR-XR) 37.5 MG 24 hr capsule    ARIPiprazole (ABILIFY) 2 MG tablet    lamoTRIgine (LAMICTAL) 100 MG tablet    2. GAD (generalized anxiety disorder)  F41.1 venlafaxine XR (EFFEXOR-XR) 150 MG 24 hr capsule    venlafaxine XR (EFFEXOR-XR) 37.5 MG 24 hr capsule    ARIPiprazole (ABILIFY) 2 MG tablet    lamoTRIgine (LAMICTAL) 100 MG tablet       Assessment:  [name] is a 47 y.o. female with a history of bipolar disorder 2 disorder, opioid use disorder on suboxone maintenance, psychogenic seizures, GAD, and asthma who presented to [facility] for initial evaluation on 07/29/2022.  During initial evaluation patient reported symptoms of insomnia, irritability, mood lability, dysphoria, fatigue, weight loss, amotivation, and passive SI without intent or plan.  Safety planning was reviewed and patient is aware of crisis resources.  She does have a past history of hypomania including decreased sleep and increased energy outside of the context of substance use.  Patient does have a significant past history of substance use including cocaine, opiates, marijuana and benzodiazepines.  She denies any cocaine use and opiate use disorder is stable on Suboxone.  Patient does use marijuana regularly and benzodiazepines infrequently.  Education was provided and patient was recommended to discontinue the benzodiazepine and marijuana use.  Patient also is experiencing seizure-like symptoms including tremors, rigidity, disorientation, and intermittent loss of consciousness without associated tongue biting or loss of bowel/bladder control.  Patient meets criteria for bipolar 2 disorder and generalized anxiety disorder.  She also likely has a psychogenic  seizure disorder will follow-up with neurology for further clarification.    [name] presents for follow-up evaluation. Today, 02/19/23, patient reports some initial improvement in her depression after the initiation of Effexor. The only notable side effect was some oral dryness which was manageable. There had been a recent stressor which led to an increase in her anxiety and depressive symptoms. Support was provided around this. We will titrate venlafaxine to 112.5 mg for 3 days before increasing to 150 mg daily. Risks and benefits were reviewed. Will continue remainder of her current regimen at this time. Can consider restarting trazodone in the future if finances improve.  Plan: - Continue Lamictal 100 mg daily for bipolar disorder - Increase Venlafaxine 112.5 mg daily for 3 days before increasing to 150 mg daily - Continue Abilify 2 mg QD for bipolar disorder - Can restart trazodone 25 mg QHS in the future if finances improve - Continue Suboxone 8-2mg  films TID managed by PCP, patient only taking 1 film a day currently, could consider Sublocade - CMP, CBC, alkaline phosphatase, TSH, T4 reviewed - Continue to follow with neurology - Therapy referral - Crisis resources reviewed - Follow up in a month  Chief Complaint:  Chief Complaint  Patient presents with   Follow-up   HPI: [name] presents reporting that the medicine has been working a bit and she felt some improvement in her mental state, but her energy is still low. The only adverse side effect she noted was some mild dryness in her mouth. Overall she finds this manageable and we discussed the importance of drinking more water to help manage this. Sleep has been  poor though it improves when she takes trazodone. Patient is unable to afford a refill on this for now but will reconsider if her financial situation improves in the future.   [name] has endorsed a recent increase in her stress after her neighbor shot her sisters dog.  Patient was very close to this dog and her own dog was out there as well which scared [name]. She has started to have seizures again after the event. Support was provided. Patient does feel that things will start to improve as she gets some closure and more time from the event.   Discussed titrating the Effexor today which patient was open to. Risks and benefits of the increase were reviewed.   Past Psychiatric History: Information collected from pt, medical record Prior hx "bipolar II". Has had hypomanic but not manic episodes outside of periods of substance abuse.  Patient's manic episodes appear to only occurred in context of substance use.  Patient has 2 prior psychiatric hospitalizations in 2005 and 2010 to [facility].  Has tried Lamictal, Depakote, Seroquel, Abilify, Celexa, Lexapro, Cymbalta, Remeron, trazodone, Xanax  Sober on buprenorphine from opiates. Uses marijuana, home grown. She takes a couple Xanax from her mom when she is overwhelmed. Counseling was provided in regards to both. She denies   Past Medical History:  Past Medical History:  Diagnosis Date   Abnormal Pap smear    LSIL   Anxiety    Asthma    Bipolar 1 disorder (HCC)    Depression    Hepatitis C    two years ago was dx   Ovarian cyst    Seizure (HCC)    Thyrotoxicosis     Past Surgical History:  Procedure Laterality Date   ABDOMINAL SURGERY     APPENDECTOMY     TUBAL LIGATION      Family History:  Family History  Problem Relation Age of Onset   Cancer Maternal Grandmother    Cancer Mother        bladder   Diabetes Mother    Diabetes Brother    Diabetes Sister    Cerebral palsy Daughter    Cancer Maternal Aunt     Social History:  Social History   Socioeconomic History   Marital status: Married    Spouse name: Not on file   Number of children: Not on file   Years of education: Not on file   Highest education level: Not on file  Occupational History   Not on file  Tobacco Use   Smoking  status: Some Days    Current packs/day: 0.50    Types: Cigarettes   Smokeless tobacco: Never   Tobacco comments:    1 pack lasts 2 days   Vaping Use   Vaping status: Never Used  Substance and Sexual Activity   Alcohol use: No   Drug use: Yes    Types: Marijuana    Comment: Occasionally.   Sexual activity: Not Currently    Birth control/protection: Surgical    Comment: tubal  Other Topics Concern   Not on file  Social History Narrative   Are you right handed or left handed? right   Are you currently employed ? 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 no   Who lives with you?  family   What type of home do you live in: 1 story or 2 story?  2 story lives up stairs 15-20 steps  Social Determinants of Health   Financial Resource Strain: Low Risk  (09/03/2021)   [facility]   Overall Financial Resource Strain (CARDIA)    Difficulty of Paying Living Expenses: Not hard at all  Recent Concern: Financial Resource Strain - High Risk (07/16/2021)   Overall Financial Resource Strain (CARDIA)    Difficulty of Paying Living Expenses: Very hard  Food Insecurity: No Food Insecurity (09/03/2021)   [facility] from [facility], [facility]   Hunger Vital Sign    Worried About Running Out of Food in the Last Year: Never true    Ran Out of Food in the Last Year: Never true  Recent Concern: Food Insecurity - Food Insecurity Present (07/16/2021)   Hunger Vital Sign    Worried About Running Out of Food in the Last Year: Sometimes true    Ran Out of Food in the Last Year: Often true  Transportation Needs: No Transportation Needs (09/03/2021)   [facility] from [facility], [facility]   [facility] - Transportation    Lack of Transportation (Medical): No    Lack of Transportation (Non-Medical): No  Physical Activity: Insufficiently Active (07/16/2021)   Exercise Vital Sign    Days of Exercise per Week: 2 days     Minutes of Exercise per Session: 20 min  Stress: Stress Concern Present (07/16/2021)   [employer] of Occupational Health - Occupational Stress Questionnaire    Feeling of Stress : Very much  Social Connections: Socially Integrated (07/16/2021)   Social Connection and Isolation Panel [NHANES]    Frequency of Communication with Friends and Family: More than three times a week    Frequency of Social Gatherings with Friends and Family: More than three times a week    Attends Religious Services: More than 4 times per year    Active Member of [employer] or Organizations: Yes    Attends Banker Meetings: More than 4 times per year    Marital Status: Living with partner    Allergies:  Allergies  Allergen Reactions   Toradol [Ketorolac Tromethamine] Hives and Itching   Adhesive [Tape] Itching and Rash    Current Medications: Current Outpatient Medications  Medication Sig Dispense Refill   venlafaxine XR (EFFEXOR-XR) 37.5 MG 24 hr capsule Take 1 capsule (37.5 mg total) by mouth daily with breakfast. Take 112.5 mg (3 37.5 mg capsule) for 3 days before increasing to 150 mg capsule. Patient has 8 37.5 mg capsules left over. 1 capsule 0   acetaminophen (TYLENOL) 500 MG tablet Take 500 mg by mouth every 6 (six) hours as needed for moderate pain.     albuterol (VENTOLIN HFA) 108 (90 Base) MCG/ACT inhaler TAKE 2 PUFFS BY MOUTH EVERY 6 HOURS AS NEEDED FOR WHEEZE OR SHORTNESS OF BREATH 18 each 2   ARIPiprazole (ABILIFY) 2 MG tablet Take 1 tablet (2 mg total) by mouth daily. 90 tablet 0   Aspirin-Acetaminophen-Caffeine (GOODY HEADACHE PO) Take by mouth.     budesonide-formoterol (SYMBICORT) 80-4.5 MCG/ACT inhaler Inhale 2 puffs into the lungs in the morning and at bedtime. Also uses as a rescue inhaler every 4 hours as needed for shortness of breath or wheezing 1 each 12   buprenorphine-naloxone (SUBOXONE) 8-2 mg SUBL SL tablet Place 1 tablet under the tongue 3 (three) times daily. 90 tablet 0    gabapentin (NEURONTIN) 300 MG capsule TAKE 3 CAPSULES (900MG  TOTAL) BY MOUTH 3 TIMES DAILY 180 capsule 5  ipratropium (ATROVENT) 0.02 % nebulizer solution USE 1 VIAL IN NEBULIZER EVERY 4 HOURS AS NEEDED FOR WHEEZING OR SHORTNESS OF BREATH 125 mL 3   lamoTRIgine (LAMICTAL) 100 MG tablet Take 1 tablet (100 mg total) by mouth daily. 90 tablet 0   megestrol (MEGACE) 40 MG tablet TAKE 3 TABLETS X 5 DAYS THE 2 X 5 DAYS THEN 1 DAILY TIL BLEEDING STOPS 135 tablet 1   pantoprazole (PROTONIX) 40 MG tablet Take 1 tablet (40 mg total) by mouth daily. 90 tablet 3   polyethylene glycol powder (GLYCOLAX/MIRALAX) 17 GM/SCOOP powder 1-4 scoops daily or as needed 255 g 11   venlafaxine XR (EFFEXOR-XR) 150 MG 24 hr capsule Take 1 capsule (150 mg total) by mouth daily with breakfast. 90 capsule 0   No current facility-administered medications for this visit.     Psychiatric Specialty Exam: Review of Systems  There were no vitals taken for this visit.There is no height or weight on file to calculate BMI.  General Appearance: Casual  Eye Contact:  Fair  Speech:  Normal Rate  Volume:  Normal  Mood:  Depressed  Affect:  Appropriate and Congruent  Thought Process:  Coherent  Orientation:  Full (Time, Place, and Person)  Thought Content: Logical   Suicidal Thoughts:  No  Homicidal Thoughts:  No  Memory:  Immediate;   Fair  Judgement:  Fair  Insight:  Fair  Psychomotor Activity:  Decreased  Concentration:  Concentration: Fair  Recall:  [employer] of Knowledge: Fair  Language: Good  Akathisia:  No    AIMS (if indicated): not done  Assets:  Communication Skills Desire for Improvement Housing Intimacy  ADL's:  Intact  Cognition: WNL  Sleep:  Fair   Metabolic Disorder Labs: Lab Results  Component Value Date   HGBA1C 5.0 08/21/2020   No results found for: "PROLACTIN" No results found for: "CHOL", "TRIG", "HDL", "CHOLHDL", "VLDL", "LDLCALC" Lab Results  Component Value Date   TSH 0.940  04/28/2022   TSH 0.646 07/03/2021    Therapeutic Level Labs: No results found for: "LITHIUM" No results found for: "VALPROATE" No results found for: "CBMZ"   Screenings: GAD-7    Flowsheet Row Office Visit from 08/22/2022 in [facility] - A Dept Of [place]. [facility] Office Visit from 07/29/2022 in [facility] Visit from 07/16/2021 in [facility] at [facility] Office Visit from 07/03/2021 in [facility] - [facility] Dept Of [place]. [facility] Office Visit from 12/18/2020 in [facility] - A Dept Of [place]. [facility]  Total GAD-7 Score 21 19 20 19 8       PHQ2-9    Flowsheet Row Office Visit from 08/22/2022 in [facility] - A Dept Of [place]. [facility] Office Visit from 07/29/2022 in [facility] Office Visit from 07/22/2022 in [facility] - A Dept Of [place]. [facility] Office Visit from 06/20/2022 in [facility] - A Dept Of [place]. [facility] Office Visit from 04/14/2022 in [facility] - A Dept Of [place]. [facility]  PHQ-2 Total Score 5 6 6 5 4   PHQ-9 Total Score 22 23 24 20 19       Flowsheet Row [facility] from  07/18/2022 in [facility] ED from 06/27/2021 in [facility] at [facility]  C-SSRS RISK CATEGORY Low Risk Error: Question 6 not populated Error: Question 6 not populated       Collaboration of Care: Collaboration of Care: Medication Management AEB medication prescription and Other provider involved in patient's care AEB PCP chart review  Patient/Guardian was advised Release of Information must be obtained prior to any record release in order to  collaborate their care with an outside provider. Patient/Guardian was advised if they have not already done so to contact the registration department to sign all necessary forms in order for [name] to release information regarding their care.   Consent: Patient/Guardian gives verbal consent for treatment and assignment of benefits for services provided during this visit. Patient/Guardian expressed understanding and agreed to proceed.    Stasia Cavalier, MD 02/19/2023, 9:00 AM   Virtual Visit via Video Note  I connected with Kenyon Paolella on 02/19/23 at  8:30 AM EST by a video enabled telemedicine application and verified that I am speaking with the correct person using two identifiers.  Location: Patient: Home Provider: Home Office   I discussed the limitations of evaluation and management by telemedicine and the availability of in person appointments. The patient expressed understanding and agreed to proceed.   I discussed the assessment and treatment plan with the patient. The patient was provided an opportunity to ask questions and all were answered. The patient agreed with the plan and demonstrated an understanding of the instructions.   The patient was advised to call back or seek an in-person evaluation if the symptoms worsen or if the condition fails to improve as anticipated.  I provided 25 minutes of non-face-to-face time during this encounter.   Stasia Cavalier, MD

## 2023-02-19 ENCOUNTER — Other Ambulatory Visit (HOSPITAL_COMMUNITY): Payer: Self-pay | Admitting: Psychiatry

## 2023-02-19 ENCOUNTER — Encounter (HOSPITAL_COMMUNITY): Payer: Self-pay | Admitting: Psychiatry

## 2023-02-19 ENCOUNTER — Other Ambulatory Visit: Payer: Self-pay | Admitting: Internal Medicine

## 2023-02-19 ENCOUNTER — Telehealth (HOSPITAL_BASED_OUTPATIENT_CLINIC_OR_DEPARTMENT_OTHER): Payer: Self-pay | Admitting: Psychiatry

## 2023-02-19 DIAGNOSIS — F1190 Opioid use, unspecified, uncomplicated: Secondary | ICD-10-CM

## 2023-02-19 DIAGNOSIS — F3181 Bipolar II disorder: Secondary | ICD-10-CM

## 2023-02-19 DIAGNOSIS — F411 Generalized anxiety disorder: Secondary | ICD-10-CM

## 2023-02-19 MED ORDER — VENLAFAXINE HCL ER 150 MG PO CP24
150.0000 mg | ORAL_CAPSULE | Freq: Every day | ORAL | 0 refills | Status: DC
Start: 2023-02-19 — End: 2023-07-14

## 2023-02-19 MED ORDER — ARIPIPRAZOLE 2 MG PO TABS
2.0000 mg | ORAL_TABLET | Freq: Every day | ORAL | 0 refills | Status: DC
Start: 2023-02-19 — End: 2023-07-14

## 2023-02-19 MED ORDER — VENLAFAXINE HCL ER 37.5 MG PO CP24: 37.5000 mg | ORAL_CAPSULE | Freq: Every day | ORAL | 0 refills | Status: DC

## 2023-02-19 MED ORDER — LAMOTRIGINE 100 MG PO TABS
100.0000 mg | ORAL_TABLET | Freq: Every day | ORAL | 0 refills | Status: DC
Start: 2023-02-19 — End: 2023-07-14

## 2023-02-19 NOTE — Telephone Encounter (Signed)
1 month refill approved, needs an in person appointment. Thanks.

## 2023-02-19 NOTE — Telephone Encounter (Signed)
Last rx written - 01/17/23. Last OV - 9/30 with Dr Ned Card. Next OV - has not been scheduled. TOX -04/14/22.

## 2023-02-19 NOTE — Telephone Encounter (Signed)
Pt was called to schedule an appt - call transferred to the front office. Appt has been scheduled for 03/06/23.

## 2023-02-27 ENCOUNTER — Other Ambulatory Visit: Payer: Self-pay | Admitting: Student

## 2023-02-27 DIAGNOSIS — J4530 Mild persistent asthma, uncomplicated: Secondary | ICD-10-CM

## 2023-03-02 NOTE — Telephone Encounter (Signed)
I believe we are just doing her suboxone and dr hall is her local PCP

## 2023-03-02 NOTE — Telephone Encounter (Signed)
Call to patient. She only saw Dr. Margo Aye x 1.  Since he cannot do her Suboxone she has decided to stay with the Clinics who can take care of all of her needs and meds.

## 2023-03-03 ENCOUNTER — Other Ambulatory Visit: Payer: Self-pay | Admitting: *Deleted

## 2023-03-03 DIAGNOSIS — J4530 Mild persistent asthma, uncomplicated: Secondary | ICD-10-CM

## 2023-03-03 DIAGNOSIS — J069 Acute upper respiratory infection, unspecified: Secondary | ICD-10-CM

## 2023-03-03 DIAGNOSIS — G6289 Other specified polyneuropathies: Secondary | ICD-10-CM

## 2023-03-04 MED ORDER — PANTOPRAZOLE SODIUM 40 MG PO TBEC
40.0000 mg | DELAYED_RELEASE_TABLET | Freq: Every day | ORAL | 3 refills | Status: AC
Start: 2023-03-04 — End: ?

## 2023-03-04 MED ORDER — IPRATROPIUM BROMIDE 0.02 % IN SOLN
0.5000 mg | Freq: Four times a day (QID) | RESPIRATORY_TRACT | 3 refills | Status: DC | PRN
Start: 2023-03-04 — End: 2023-10-19

## 2023-03-04 MED ORDER — GABAPENTIN 300 MG PO CAPS
ORAL_CAPSULE | ORAL | 5 refills | Status: DC
Start: 2023-03-04 — End: 2023-10-19

## 2023-03-06 ENCOUNTER — Encounter: Payer: Self-pay | Admitting: Internal Medicine

## 2023-03-06 ENCOUNTER — Ambulatory Visit: Payer: Medicaid Other | Admitting: Internal Medicine

## 2023-03-06 VITALS — BP 118/88 | HR 84 | Temp 98.4°F | Ht 63.0 in | Wt 160.0 lb

## 2023-03-06 DIAGNOSIS — F1190 Opioid use, unspecified, uncomplicated: Secondary | ICD-10-CM | POA: Diagnosis not present

## 2023-03-06 MED ORDER — BUDESONIDE-FORMOTEROL FUMARATE 80-4.5 MCG/ACT IN AERO: 2.0000 | INHALATION_SPRAY | Freq: Two times a day (BID) | RESPIRATORY_TRACT | 12 refills | Status: AC

## 2023-03-06 MED ORDER — BUPRENORPHINE HCL-NALOXONE HCL 8-2 MG SL SUBL
1.0000 | SUBLINGUAL_TABLET | Freq: Three times a day (TID) | SUBLINGUAL | 2 refills | Status: DC
Start: 2023-03-30 — End: 2023-08-17

## 2023-03-06 MED ORDER — FLUTICASONE PROPIONATE 50 MCG/ACT NA SUSP: 1.0000 | Freq: Every day | NASAL | 1 refills | Status: DC

## 2023-03-06 NOTE — Patient Instructions (Signed)
Barbara Simon,   It was a pleasure meeting you today.   I have sent in 30 days of Suboxone starting January 13th with two refills. We will plan on a televisit in three months to check on how things are going and for another refill.   Thanks,  Dr Carlynn Purl

## 2023-03-06 NOTE — Assessment & Plan Note (Addendum)
Patient here today for refill of Suboxone 8-2 tablets TID. She denies cravings or illicit substance use. Last prescription picked up 02/27/23. Last toxassure was almost a year ago and was appropriate. Will repeat today. She lives in [place] and would like to do a televisit next time if possible - Refill suboxone 8-2 tablets TID starting Jan 13 with two refills - Toxassure today - Televisit in 3 months

## 2023-03-06 NOTE — Progress Notes (Signed)
Subjective:  CC: OUD  HPI:  [name] is a 47 y.o. female with a past medical history stated below and presents today for above. Please see problem based assessment and plan for additional details.  Past Medical History:  Diagnosis Date   Abnormal Pap smear    LSIL   Anxiety    Asthma    Bipolar 1 disorder (HCC)    Depression    Hepatitis C    two years ago was dx   Ovarian cyst    Seizure (HCC)    Thyrotoxicosis     Current Outpatient Medications on File Prior to Visit  Medication Sig Dispense Refill   acetaminophen (TYLENOL) 500 MG tablet Take 500 mg by mouth every 6 (six) hours as needed for moderate pain.     albuterol (VENTOLIN HFA) 108 (90 Base) MCG/ACT inhaler TAKE 2 PUFFS BY MOUTH EVERY 6 HOURS AS NEEDED FOR WHEEZE OR SHORTNESS OF BREATH 18 each 2   ARIPiprazole (ABILIFY) 2 MG tablet Take 1 tablet (2 mg total) by mouth daily. 90 tablet 0   Aspirin-Acetaminophen-Caffeine (GOODY HEADACHE PO) Take by mouth.     budesonide-formoterol (SYMBICORT) 80-4.5 MCG/ACT inhaler Inhale 2 puffs into the lungs in the morning and at bedtime. Also uses as a rescue inhaler every 4 hours as needed for shortness of breath or wheezing 1 each 12   buprenorphine-naloxone (SUBOXONE) 8-2 mg SUBL SL tablet PLACE 1 TABLET UNDER THE TONGUE 3 (THREE) TIMES DAILY. 90 tablet 0   gabapentin (NEURONTIN) 300 MG capsule TAKE 3 CAPSULES (900MG  TOTAL) BY MOUTH 3 TIMES DAILY 180 capsule 5   ipratropium (ATROVENT) 0.02 % nebulizer solution Take 2.5 mLs (0.5 mg total) by nebulization every 6 (six) hours as needed for wheezing or shortness of breath. 125 mL 3   lamoTRIgine (LAMICTAL) 100 MG tablet Take 1 tablet (100 mg total) by mouth daily. 90 tablet 0   megestrol (MEGACE) 40 MG tablet TAKE 3 TABLETS X 5 DAYS THE 2 X 5 DAYS THEN 1 DAILY TIL BLEEDING STOPS 135 tablet 1   pantoprazole (PROTONIX) 40 MG tablet Take 1 tablet (40 mg total) by mouth daily. 90 tablet 3   polyethylene glycol powder  (GLYCOLAX/MIRALAX) 17 GM/SCOOP powder 1-4 scoops daily or as needed 255 g 11   venlafaxine XR (EFFEXOR-XR) 150 MG 24 hr capsule Take 1 capsule (150 mg total) by mouth daily with breakfast. 90 capsule 0   venlafaxine XR (EFFEXOR-XR) 37.5 MG 24 hr capsule TAKE 1 CAPSULE (37.5 MG TOTAL) BY MOUTH DAILY WITH BREAKFAST. TAKE 112.5 MG (3 37.5 MG CAPSULE) FOR 3 DAYS BEFORE INCREASING TO 150 MG CAPSULE. PATIENT HAS 8 37.5 MG CAPSULES LEFT OVER. 1 capsule 0   No current facility-administered medications on file prior to visit.    Review of Systems: ROS negative except for as is noted on the assessment and plan.  Objective:   Vitals:   03/06/23 0942  BP: 118/88  Pulse: 84  Temp: 98.4 F (36.9 C)  SpO2: 98%  Weight: 160 lb (72.6 kg)  Height: 5\' 3"  (1.6 m)    Physical Exam: Constitutional: well-appearing, in no acute distress HENT: normocephalic atraumatic, mucous membranes moist Cardiovascular: regular rate and rhythm, no m/r/g Pulmonary/Chest: normal work of breathing on room air, lungs clear to auscultation bilaterally  Assessment & Plan:   Opioid use disorder Patient here today for refill of Suboxone 8-2 tablets TID. She denies cravings or illicit substance use. Last prescription picked up 02/27/23. Last  toxassure was almost a year ago and was appropriate. Will repeat today. She lives in [place] and would like to do a televisit next time if possible - Refill suboxone 8-2 tablets TID starting Jan 13 with two refills - Toxassure today - Televisit in 3 months    Patient seen with Dr. Cleda Daub  Monna Fam MD [facility]  PGY-1 Pager: [phone] Date 03/06/2023  Time 9:55 AM

## 2023-03-12 LAB — TOXASSURE SELECT,+ANTIDEPR,UR

## 2023-03-24 ENCOUNTER — Other Ambulatory Visit (HOSPITAL_COMMUNITY): Payer: Self-pay

## 2023-03-29 DIAGNOSIS — K449 Diaphragmatic hernia without obstruction or gangrene: Secondary | ICD-10-CM | POA: Diagnosis not present

## 2023-03-29 DIAGNOSIS — R1084 Generalized abdominal pain: Secondary | ICD-10-CM | POA: Diagnosis not present

## 2023-03-29 DIAGNOSIS — Z90710 Acquired absence of both cervix and uterus: Secondary | ICD-10-CM | POA: Diagnosis not present

## 2023-03-29 DIAGNOSIS — I10 Essential (primary) hypertension: Secondary | ICD-10-CM | POA: Diagnosis not present

## 2023-03-29 DIAGNOSIS — R0602 Shortness of breath: Secondary | ICD-10-CM | POA: Diagnosis not present

## 2023-03-29 DIAGNOSIS — K56609 Unspecified intestinal obstruction, unspecified as to partial versus complete obstruction: Secondary | ICD-10-CM | POA: Diagnosis not present

## 2023-03-29 DIAGNOSIS — K631 Perforation of intestine (nontraumatic): Secondary | ICD-10-CM | POA: Diagnosis not present

## 2023-03-30 DIAGNOSIS — R0602 Shortness of breath: Secondary | ICD-10-CM | POA: Diagnosis not present

## 2023-03-30 DIAGNOSIS — K631 Perforation of intestine (nontraumatic): Secondary | ICD-10-CM | POA: Diagnosis not present

## 2023-03-30 DIAGNOSIS — R0989 Other specified symptoms and signs involving the circulatory and respiratory systems: Secondary | ICD-10-CM | POA: Diagnosis not present

## 2023-03-30 DIAGNOSIS — Z9911 Dependence on respirator [ventilator] status: Secondary | ICD-10-CM | POA: Diagnosis not present

## 2023-03-30 DIAGNOSIS — Z452 Encounter for adjustment and management of vascular access device: Secondary | ICD-10-CM | POA: Diagnosis not present

## 2023-03-30 DIAGNOSIS — N179 Acute kidney failure, unspecified: Secondary | ICD-10-CM | POA: Diagnosis not present

## 2023-03-30 DIAGNOSIS — A419 Sepsis, unspecified organism: Secondary | ICD-10-CM | POA: Diagnosis not present

## 2023-03-30 DIAGNOSIS — R109 Unspecified abdominal pain: Secondary | ICD-10-CM | POA: Diagnosis not present

## 2023-03-30 DIAGNOSIS — R000 Tachycardia, unspecified: Secondary | ICD-10-CM | POA: Diagnosis not present

## 2023-03-30 DIAGNOSIS — K668 Other specified disorders of peritoneum: Secondary | ICD-10-CM | POA: Diagnosis not present

## 2023-03-30 DIAGNOSIS — R918 Other nonspecific abnormal finding of lung field: Secondary | ICD-10-CM | POA: Diagnosis not present

## 2023-03-30 DIAGNOSIS — J9601 Acute respiratory failure with hypoxia: Secondary | ICD-10-CM | POA: Diagnosis not present

## 2023-03-30 DIAGNOSIS — R6521 Severe sepsis with septic shock: Secondary | ICD-10-CM | POA: Diagnosis not present

## 2023-03-30 DIAGNOSIS — R194 Change in bowel habit: Secondary | ICD-10-CM | POA: Diagnosis not present

## 2023-03-30 DIAGNOSIS — R579 Shock, unspecified: Secondary | ICD-10-CM | POA: Diagnosis not present

## 2023-03-30 DIAGNOSIS — R4182 Altered mental status, unspecified: Secondary | ICD-10-CM | POA: Diagnosis not present

## 2023-03-30 DIAGNOSIS — I959 Hypotension, unspecified: Secondary | ICD-10-CM | POA: Diagnosis not present

## 2023-03-30 DIAGNOSIS — K56699 Other intestinal obstruction unspecified as to partial versus complete obstruction: Secondary | ICD-10-CM | POA: Diagnosis not present

## 2023-03-30 DIAGNOSIS — J9600 Acute respiratory failure, unspecified whether with hypoxia or hypercapnia: Secondary | ICD-10-CM | POA: Diagnosis not present

## 2023-03-30 DIAGNOSIS — Z4682 Encounter for fitting and adjustment of non-vascular catheter: Secondary | ICD-10-CM | POA: Diagnosis not present

## 2023-03-30 NOTE — Progress Notes (Deleted)
BH MD/PA/NP OP Progress Note  03/30/2023 9:09 AM Avenell Foose Ravan  MRN:  [id number]  Visit Diagnosis:  No diagnosis found.  Assessment:  Kendal H Brunelle is a 48 y.o. female with a history of bipolar disorder 2 disorder, opioid use disorder on suboxone maintenance, psychogenic seizures, GAD, and asthma who presented to [facility] [place] for initial evaluation on 07/29/2022.  During initial evaluation patient reported symptoms of insomnia, irritability, mood lability, dysphoria, fatigue, weight loss, amotivation, and passive SI without intent or plan.  Safety planning was reviewed and patient is aware of crisis resources.  She does have a past history of hypomania including decreased sleep and increased energy outside of the context of substance use.  Patient does have a significant past history of substance use including cocaine, opiates, marijuana and benzodiazepines.  She denies any cocaine use and opiate use disorder is stable on Suboxone.  Patient does use marijuana regularly and benzodiazepines infrequently.  Education was provided and patient was recommended to discontinue the benzodiazepine and marijuana use.  Patient also is experiencing seizure-like symptoms including tremors, rigidity, disorientation, and intermittent loss of consciousness without associated tongue biting or loss of bowel/bladder control.  Patient meets criteria for bipolar 2 disorder and generalized anxiety disorder.  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    Leiani H Mcauliffe presents for follow-up evaluation. Today, 03/30/23, patient reports    some initial improvement in her depression after the initiation of Effexor. The only notable side effect was some oral dryness which was manageable. There had been a recent stressor which led to an increase in her anxiety and depressive symptoms. Support was provided around this. We will  titrate venlafaxine to 112.5 mg for 3 days before increasing to 150 mg daily. Risks and benefits were reviewed. Will continue remainder of her current regimen at this time. Can consider restarting trazodone in the future if finances improve.  Plan: - Continue Lamictal 100 mg daily for bipolar disorder - Increase Venlafaxine 112.5 mg daily for 3 days before increasing to 150 mg daily - Continue Abilify 2 mg QD for bipolar disorder - Can restart trazodone 25 mg QHS in the future if finances improve - Continue Suboxone 8-2mg  films TID managed by PCP, patient only taking 1 film a day currently, could consider Sublocade - CMP, CBC, alkaline phosphatase, TSH, T4 reviewed - Continue to follow with neurology - Therapy referral - Crisis resources reviewed - Follow up in a month  Chief Complaint:  No chief complaint on file.  HPI: Susannah presents reporting that    the medicine has been working a bit and she felt some improvement in her mental state, but her energy is still low. The only adverse side effect she noted was some mild dryness in her mouth. Overall she finds this manageable and we discussed the importance of drinking more water to help manage this. Sleep has been poor though it improves when she takes trazodone. Patient is unable to afford a refill on this for now but will reconsider if her financial situation improves in the future.   Desare has endorsed a recent increase in her stress after her neighbor shot her sisters dog. Patient was very close to this dog and her own dog was out there as well which scared Debora. She has started to have seizures again after the event. Support was provided. Patient does feel that things will start to improve as she gets some  closure and more time from the event.   Discussed titrating the Effexor today which patient was open to. Risks and benefits of the increase were reviewed.   Past Psychiatric History: Information collected from pt, medical  record Prior hx "bipolar II". Has had hypomanic but not manic episodes outside of periods of substance abuse.  Patient's manic episodes appear to only occurred in context of substance use.  Patient has 2 prior psychiatric hospitalizations in 2005 and 2010 to [facility].  Has tried Lamictal, Depakote, Seroquel, Abilify, Celexa, Lexapro, Cymbalta, Remeron, trazodone, Xanax  Sober on buprenorphine from opiates. Uses marijuana, home grown. She takes a couple Xanax from her mom when she is overwhelmed. Counseling was provided in regards to both. She denies   Past Medical History:  Past Medical History:  Diagnosis Date   Abnormal Pap smear    LSIL   Anxiety    Asthma    Bipolar 1 disorder (HCC)    Depression    Hepatitis C    two years ago was dx   Ovarian cyst    Seizure (HCC)    Thyrotoxicosis     Past Surgical History:  Procedure Laterality Date   ABDOMINAL SURGERY     APPENDECTOMY     TUBAL LIGATION      Family History:  Family History  Problem Relation Age of Onset   Cancer Maternal Grandmother    Cancer Mother        bladder   Diabetes Mother    Diabetes Brother    Diabetes Sister    Cerebral palsy Daughter    Cancer Maternal Aunt     Social History:  Social History   Socioeconomic History   Marital status: Married    Spouse name: Not on file   Number of children: Not on file   Years of education: Not on file   Highest education level: Not on file  Occupational History   Not on file  Tobacco Use   Smoking status: Some Days    Current packs/day: 0.50    Types: Cigarettes   Smokeless tobacco: Never   Tobacco comments:    1 pack lasts 2 days   Vaping Use   Vaping status: Never Used  Substance and Sexual Activity   Alcohol use: No   Drug use: Yes    Types: Marijuana    Comment: Occasionally.   Sexual activity: Not Currently    Birth control/protection: Surgical    Comment: tubal  Other Topics Concern   Not on file  Social History Narrative   Are you  right handed or left handed? right   Are you currently employed ? 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 no   Who lives with you?  family   What type of home do you live in: 1 story or 2 story?  2 story lives up stairs 15-20 steps       Social Drivers of Health   Financial Resource Strain: Low Risk  (09/03/2021)   Received from [facility], [facility]   Overall Financial Resource Strain (CARDIA)    Difficulty of Paying Living Expenses: Not hard at all  Recent Concern: Financial Resource Strain - High Risk (07/16/2021)   Overall Financial Resource Strain (CARDIA)    Difficulty of Paying Living Expenses: Very hard  Food Insecurity: No Food Insecurity (09/03/2021)   Received from [facility], [facility]   Hunger Vital  Sign    Worried About Programme researcher, broadcasting/film/video in the Last Year: Never true    Ran Out of Food in the Last Year: Never true  Recent Concern: Food Insecurity - Food Insecurity Present (07/16/2021)   Hunger Vital Sign    Worried About Running Out of Food in the Last Year: Sometimes true    Ran Out of Food in the Last Year: Often true  Transportation Needs: No Transportation Needs (09/03/2021)   Received from [facility], [facility]   [facility] - Transportation    Lack of Transportation (Medical): No    Lack of Transportation (Non-Medical): No  Physical Activity: Insufficiently Active (07/16/2021)   Exercise Vital Sign    Days of Exercise per Week: 2 days    Minutes of Exercise per Session: 20 min  Stress: Stress Concern Present (07/16/2021)   [employer] of Occupational Health - Occupational Stress Questionnaire    Feeling of Stress : Very much  Social Connections: Socially Integrated (07/16/2021)   Social Connection and Isolation Panel [NHANES]    Frequency of Communication with Friends and Family: More than three times a week    Frequency of Social Gatherings with Friends and Family: More than three times a week     Attends Religious Services: More than 4 times per year    Active Member of [employer] or Organizations: Yes    Attends Banker Meetings: More than 4 times per year    Marital Status: Living with partner    Allergies:  Allergies  Allergen Reactions   Toradol [Ketorolac Tromethamine] Hives and Itching   Adhesive [Tape] Itching and Rash    Current Medications: Current Outpatient Medications  Medication Sig Dispense Refill   acetaminophen (TYLENOL) 500 MG tablet Take 500 mg by mouth every 6 (six) hours as needed for moderate pain.     albuterol (VENTOLIN HFA) 108 (90 Base) MCG/ACT inhaler TAKE 2 PUFFS BY MOUTH EVERY 6 HOURS AS NEEDED FOR WHEEZE OR SHORTNESS OF BREATH 18 each 2   ARIPiprazole (ABILIFY) 2 MG tablet Take 1 tablet (2 mg total) by mouth daily. 90 tablet 0   Aspirin-Acetaminophen-Caffeine (GOODY HEADACHE PO) Take by mouth.     budesonide-formoterol (SYMBICORT) 80-4.5 MCG/ACT inhaler Inhale 2 puffs into the lungs in the morning and at bedtime. Also uses as a rescue inhaler every 4 hours as needed for shortness of breath or wheezing 1 each 12   buprenorphine-naloxone (SUBOXONE) 8-2 mg SUBL SL tablet Place 1 tablet under the tongue 3 (three) times daily. 90 tablet 2   fluticasone (FLONASE) 50 MCG/ACT nasal spray Place 1 spray into both nostrils daily. 9.9 mL 1   gabapentin (NEURONTIN) 300 MG capsule TAKE 3 CAPSULES (900MG  TOTAL) BY MOUTH 3 TIMES DAILY 180 capsule 5   ipratropium (ATROVENT) 0.02 % nebulizer solution Take 2.5 mLs (0.5 mg total) by nebulization every 6 (six) hours as needed for wheezing or shortness of breath. 125 mL 3   lamoTRIgine (LAMICTAL) 100 MG tablet Take 1 tablet (100 mg total) by mouth daily. 90 tablet 0   megestrol (MEGACE) 40 MG tablet TAKE 3 TABLETS X 5 DAYS THE 2 X 5 DAYS THEN 1 DAILY TIL BLEEDING STOPS 135 tablet 1   pantoprazole (PROTONIX) 40 MG tablet Take 1 tablet (40 mg total) by mouth daily. 90 tablet 3   polyethylene glycol powder  (GLYCOLAX/MIRALAX) 17 GM/SCOOP powder 1-4 scoops daily or as needed 255 g 11   venlafaxine XR (EFFEXOR-XR) 150 MG 24  hr capsule Take 1 capsule (150 mg total) by mouth daily with breakfast. 90 capsule 0   venlafaxine XR (EFFEXOR-XR) 37.5 MG 24 hr capsule TAKE 1 CAPSULE (37.5 MG TOTAL) BY MOUTH DAILY WITH BREAKFAST. TAKE 112.5 MG (3 37.5 MG CAPSULE) FOR 3 DAYS BEFORE INCREASING TO 150 MG CAPSULE. PATIENT HAS 8 37.5 MG CAPSULES LEFT OVER. 1 capsule 0   No current facility-administered medications for this visit.     Psychiatric Specialty Exam: Review of Systems  There were no vitals taken for this visit.There is no height or weight on file to calculate BMI.  General Appearance: Casual  Eye Contact:  Fair  Speech:  Normal Rate  Volume:  Normal  Mood:  Depressed  Affect:  Appropriate and Congruent  Thought Process:  Coherent  Orientation:  Full (Time, Place, and Person)  Thought Content: Logical   Suicidal Thoughts:  No  Homicidal Thoughts:  No  Memory:  Immediate;   Fair  Judgement:  Fair  Insight:  Fair  Psychomotor Activity:  Decreased  Concentration:  Concentration: Fair  Recall:  [employer] of Knowledge: Fair  Language: Good  Akathisia:  No    AIMS (if indicated): not done  Assets:  Communication Skills Desire for Improvement Housing Intimacy  ADL's:  Intact  Cognition: WNL  Sleep:  Fair   Metabolic Disorder Labs: Lab Results  Component Value Date   HGBA1C 5.0 08/21/2020   No results found for: "PROLACTIN" No results found for: "CHOL", "TRIG", "HDL", "CHOLHDL", "VLDL", "LDLCALC" Lab Results  Component Value Date   TSH 0.940 04/28/2022   TSH 0.646 07/03/2021    Therapeutic Level Labs: No results found for: "LITHIUM" No results found for: "VALPROATE" No results found for: "CBMZ"   Screenings: GAD-7    Flowsheet Row Office Visit from 08/22/2022 in [facility] - A Dept Of [place]. [facility] Office Visit from 07/29/2022 in  [facility] Visit from 07/16/2021 in [facility] for [facility] Office Visit from 07/03/2021 in [facility] - A Dept Of [place]. [facility] Office Visit from 12/18/2020 in [facility] - A Dept [place]. [facility]  Total GAD-7 Score 21 19 20 19 8       PHQ2-9    Flowsheet Row Office Visit from 08/22/2022 in [facility] - A Dept Of [place]. [facility] Office Visit from 07/29/2022 in [facility] Office Visit from 07/22/2022 in [facility] - A Dept Of [place]. [facility] Office Visit from 06/20/2022 in [facility] - A Dept Of [place]. [facility] Office Visit from 04/14/2022 in [facility] - A Dept Of . [facility]  PHQ-2 Total Score 5 6 6 5 4   PHQ-9 Total Score 22 23 24 20 19       Flowsheet Row [facility] from 07[facility]18/2022 in [facility] ED from 06/27/2021 in [facility] Emergency Department at [facility]  C-SSRS RISK CATEGORY Low Risk Error: Question 6 not populated Error: Question 6 not populated       Collaboration of Care: Collaboration of Care: Medication Management AEB medication prescription and Other provider involved in patient's care AEB PCP chart review  Patient/Guardian was advised Release  of Information must be obtained prior to any record release in order to collaborate their care with an outside provider. Patient/Guardian was advised if they have not already done so to contact the registration department to sign all necessary forms in order for [name] to release information regarding their care.   Consent: Patient/Guardian gives verbal consent for treatment and assignment of benefits for services  provided during this visit. Patient/Guardian expressed understanding and agreed to proceed.    Stasia Cavalier, MD 03/30/2023, 9:09 AM   Virtual Visit via Video Note  I connected with Edwina Dorning on 03/30/23 at  8:30 AM EST by a video enabled telemedicine application and verified that I am speaking with the correct person using two identifiers.  Location: Patient: Home Provider: Home Office   I discussed the limitations of evaluation and management by telemedicine and the availability of in person appointments. The patient expressed understanding and agreed to proceed.   I discussed the assessment and treatment plan with the patient. The patient was provided an opportunity to ask questions and all were answered. The patient agreed with the plan and demonstrated an understanding of the instructions.   The patient was advised to call back or seek an in-person evaluation if the symptoms worsen or if the condition fails to improve as anticipated.  I provided 25 minutes of non-face-to-face time during this encounter.   Stasia Cavalier, MD

## 2023-03-31 DIAGNOSIS — K631 Perforation of intestine (nontraumatic): Secondary | ICD-10-CM | POA: Diagnosis not present

## 2023-03-31 DIAGNOSIS — N179 Acute kidney failure, unspecified: Secondary | ICD-10-CM | POA: Diagnosis not present

## 2023-03-31 DIAGNOSIS — Z4682 Encounter for fitting and adjustment of non-vascular catheter: Secondary | ICD-10-CM | POA: Diagnosis not present

## 2023-03-31 DIAGNOSIS — Z9851 Tubal ligation status: Secondary | ICD-10-CM | POA: Diagnosis not present

## 2023-03-31 DIAGNOSIS — N838 Other noninflammatory disorders of ovary, fallopian tube and broad ligament: Secondary | ICD-10-CM | POA: Diagnosis not present

## 2023-03-31 DIAGNOSIS — Z0189 Encounter for other specified special examinations: Secondary | ICD-10-CM | POA: Diagnosis not present

## 2023-03-31 DIAGNOSIS — R6521 Severe sepsis with septic shock: Secondary | ICD-10-CM | POA: Diagnosis not present

## 2023-03-31 DIAGNOSIS — A419 Sepsis, unspecified organism: Secondary | ICD-10-CM | POA: Diagnosis not present

## 2023-03-31 DIAGNOSIS — J9811 Atelectasis: Secondary | ICD-10-CM | POA: Diagnosis not present

## 2023-03-31 DIAGNOSIS — Z9911 Dependence on respirator [ventilator] status: Secondary | ICD-10-CM | POA: Diagnosis not present

## 2023-03-31 DIAGNOSIS — J9600 Acute respiratory failure, unspecified whether with hypoxia or hypercapnia: Secondary | ICD-10-CM | POA: Diagnosis not present

## 2023-03-31 DIAGNOSIS — E872 Acidosis, unspecified: Secondary | ICD-10-CM | POA: Diagnosis not present

## 2023-04-01 DIAGNOSIS — J9600 Acute respiratory failure, unspecified whether with hypoxia or hypercapnia: Secondary | ICD-10-CM | POA: Diagnosis not present

## 2023-04-01 DIAGNOSIS — K631 Perforation of intestine (nontraumatic): Secondary | ICD-10-CM | POA: Diagnosis not present

## 2023-04-01 DIAGNOSIS — R4182 Altered mental status, unspecified: Secondary | ICD-10-CM | POA: Diagnosis not present

## 2023-04-01 DIAGNOSIS — E162 Hypoglycemia, unspecified: Secondary | ICD-10-CM | POA: Diagnosis not present

## 2023-04-01 DIAGNOSIS — N179 Acute kidney failure, unspecified: Secondary | ICD-10-CM | POA: Diagnosis not present

## 2023-04-01 DIAGNOSIS — Z9911 Dependence on respirator [ventilator] status: Secondary | ICD-10-CM | POA: Diagnosis not present

## 2023-04-02 ENCOUNTER — Encounter (HOSPITAL_COMMUNITY): Payer: Medicaid Other | Admitting: Psychiatry

## 2023-04-02 ENCOUNTER — Encounter (HOSPITAL_COMMUNITY): Payer: Self-pay

## 2023-04-02 DIAGNOSIS — Z9911 Dependence on respirator [ventilator] status: Secondary | ICD-10-CM | POA: Diagnosis not present

## 2023-04-02 DIAGNOSIS — R918 Other nonspecific abnormal finding of lung field: Secondary | ICD-10-CM | POA: Diagnosis not present

## 2023-04-02 DIAGNOSIS — J9600 Acute respiratory failure, unspecified whether with hypoxia or hypercapnia: Secondary | ICD-10-CM | POA: Diagnosis not present

## 2023-04-02 DIAGNOSIS — G9340 Encephalopathy, unspecified: Secondary | ICD-10-CM | POA: Diagnosis not present

## 2023-04-02 DIAGNOSIS — D696 Thrombocytopenia, unspecified: Secondary | ICD-10-CM | POA: Diagnosis not present

## 2023-04-02 DIAGNOSIS — R1111 Vomiting without nausea: Secondary | ICD-10-CM | POA: Diagnosis not present

## 2023-04-02 DIAGNOSIS — E872 Acidosis, unspecified: Secondary | ICD-10-CM | POA: Diagnosis not present

## 2023-04-02 DIAGNOSIS — R000 Tachycardia, unspecified: Secondary | ICD-10-CM | POA: Diagnosis not present

## 2023-04-02 NOTE — Progress Notes (Signed)
This encounter was created in error - please disregard.

## 2023-04-03 DIAGNOSIS — J984 Other disorders of lung: Secondary | ICD-10-CM | POA: Diagnosis not present

## 2023-04-03 DIAGNOSIS — R4182 Altered mental status, unspecified: Secondary | ICD-10-CM | POA: Diagnosis not present

## 2023-04-03 DIAGNOSIS — Z9911 Dependence on respirator [ventilator] status: Secondary | ICD-10-CM | POA: Diagnosis not present

## 2023-04-03 DIAGNOSIS — G9340 Encephalopathy, unspecified: Secondary | ICD-10-CM | POA: Diagnosis not present

## 2023-04-03 DIAGNOSIS — R0603 Acute respiratory distress: Secondary | ICD-10-CM | POA: Diagnosis not present

## 2023-04-03 DIAGNOSIS — J9600 Acute respiratory failure, unspecified whether with hypoxia or hypercapnia: Secondary | ICD-10-CM | POA: Diagnosis not present

## 2023-04-03 DIAGNOSIS — R000 Tachycardia, unspecified: Secondary | ICD-10-CM | POA: Diagnosis not present

## 2023-04-03 DIAGNOSIS — Z452 Encounter for adjustment and management of vascular access device: Secondary | ICD-10-CM | POA: Diagnosis not present

## 2023-04-03 DIAGNOSIS — Z4682 Encounter for fitting and adjustment of non-vascular catheter: Secondary | ICD-10-CM | POA: Diagnosis not present

## 2023-04-04 DIAGNOSIS — J984 Other disorders of lung: Secondary | ICD-10-CM | POA: Diagnosis not present

## 2023-04-04 DIAGNOSIS — A419 Sepsis, unspecified organism: Secondary | ICD-10-CM | POA: Diagnosis not present

## 2023-04-04 DIAGNOSIS — N179 Acute kidney failure, unspecified: Secondary | ICD-10-CM | POA: Diagnosis not present

## 2023-04-04 DIAGNOSIS — J9600 Acute respiratory failure, unspecified whether with hypoxia or hypercapnia: Secondary | ICD-10-CM | POA: Diagnosis not present

## 2023-04-04 DIAGNOSIS — Z9911 Dependence on respirator [ventilator] status: Secondary | ICD-10-CM | POA: Diagnosis not present

## 2023-04-04 DIAGNOSIS — R6521 Severe sepsis with septic shock: Secondary | ICD-10-CM | POA: Diagnosis not present

## 2023-04-04 DIAGNOSIS — R579 Shock, unspecified: Secondary | ICD-10-CM | POA: Diagnosis not present

## 2023-04-04 DIAGNOSIS — I959 Hypotension, unspecified: Secondary | ICD-10-CM | POA: Diagnosis not present

## 2023-04-05 DIAGNOSIS — Z4682 Encounter for fitting and adjustment of non-vascular catheter: Secondary | ICD-10-CM | POA: Diagnosis not present

## 2023-04-05 DIAGNOSIS — R918 Other nonspecific abnormal finding of lung field: Secondary | ICD-10-CM | POA: Diagnosis not present

## 2023-04-05 DIAGNOSIS — Z03822 Encounter for observation for suspected aspirated (inhaled) foreign body ruled out: Secondary | ICD-10-CM | POA: Diagnosis not present

## 2023-04-05 DIAGNOSIS — K631 Perforation of intestine (nontraumatic): Secondary | ICD-10-CM | POA: Diagnosis not present

## 2023-04-05 DIAGNOSIS — R4182 Altered mental status, unspecified: Secondary | ICD-10-CM | POA: Diagnosis not present

## 2023-04-05 DIAGNOSIS — G9340 Encephalopathy, unspecified: Secondary | ICD-10-CM | POA: Diagnosis not present

## 2023-04-05 DIAGNOSIS — R1111 Vomiting without nausea: Secondary | ICD-10-CM | POA: Diagnosis not present

## 2023-04-05 DIAGNOSIS — R000 Tachycardia, unspecified: Secondary | ICD-10-CM | POA: Diagnosis not present

## 2023-04-05 DIAGNOSIS — J9600 Acute respiratory failure, unspecified whether with hypoxia or hypercapnia: Secondary | ICD-10-CM | POA: Diagnosis not present

## 2023-04-06 DIAGNOSIS — R000 Tachycardia, unspecified: Secondary | ICD-10-CM | POA: Diagnosis not present

## 2023-04-06 DIAGNOSIS — K631 Perforation of intestine (nontraumatic): Secondary | ICD-10-CM | POA: Diagnosis not present

## 2023-04-06 DIAGNOSIS — Z03822 Encounter for observation for suspected aspirated (inhaled) foreign body ruled out: Secondary | ICD-10-CM | POA: Diagnosis not present

## 2023-04-06 DIAGNOSIS — R188 Other ascites: Secondary | ICD-10-CM | POA: Diagnosis not present

## 2023-04-06 DIAGNOSIS — R0689 Other abnormalities of breathing: Secondary | ICD-10-CM | POA: Diagnosis not present

## 2023-04-06 DIAGNOSIS — D62 Acute posthemorrhagic anemia: Secondary | ICD-10-CM | POA: Diagnosis not present

## 2023-04-06 DIAGNOSIS — E870 Hyperosmolality and hypernatremia: Secondary | ICD-10-CM | POA: Diagnosis not present

## 2023-04-06 DIAGNOSIS — I959 Hypotension, unspecified: Secondary | ICD-10-CM | POA: Diagnosis not present

## 2023-04-07 DIAGNOSIS — F1190 Opioid use, unspecified, uncomplicated: Secondary | ICD-10-CM | POA: Diagnosis not present

## 2023-04-07 DIAGNOSIS — R0689 Other abnormalities of breathing: Secondary | ICD-10-CM | POA: Diagnosis not present

## 2023-04-07 DIAGNOSIS — R000 Tachycardia, unspecified: Secondary | ICD-10-CM | POA: Diagnosis not present

## 2023-04-07 DIAGNOSIS — R188 Other ascites: Secondary | ICD-10-CM | POA: Diagnosis not present

## 2023-04-07 DIAGNOSIS — D62 Acute posthemorrhagic anemia: Secondary | ICD-10-CM | POA: Diagnosis not present

## 2023-04-07 DIAGNOSIS — F05 Delirium due to known physiological condition: Secondary | ICD-10-CM | POA: Diagnosis not present

## 2023-04-07 DIAGNOSIS — K631 Perforation of intestine (nontraumatic): Secondary | ICD-10-CM | POA: Diagnosis not present

## 2023-04-07 DIAGNOSIS — G8918 Other acute postprocedural pain: Secondary | ICD-10-CM | POA: Diagnosis not present

## 2023-04-07 DIAGNOSIS — R4182 Altered mental status, unspecified: Secondary | ICD-10-CM | POA: Diagnosis not present

## 2023-04-08 DIAGNOSIS — G8918 Other acute postprocedural pain: Secondary | ICD-10-CM | POA: Diagnosis not present

## 2023-04-08 DIAGNOSIS — R0689 Other abnormalities of breathing: Secondary | ICD-10-CM | POA: Diagnosis not present

## 2023-04-08 DIAGNOSIS — D696 Thrombocytopenia, unspecified: Secondary | ICD-10-CM | POA: Diagnosis not present

## 2023-04-08 DIAGNOSIS — E875 Hyperkalemia: Secondary | ICD-10-CM | POA: Diagnosis not present

## 2023-04-08 DIAGNOSIS — E870 Hyperosmolality and hypernatremia: Secondary | ICD-10-CM | POA: Diagnosis not present

## 2023-04-09 DIAGNOSIS — G8918 Other acute postprocedural pain: Secondary | ICD-10-CM | POA: Diagnosis not present

## 2023-04-09 DIAGNOSIS — R0689 Other abnormalities of breathing: Secondary | ICD-10-CM | POA: Diagnosis not present

## 2023-04-09 DIAGNOSIS — E875 Hyperkalemia: Secondary | ICD-10-CM | POA: Diagnosis not present

## 2023-04-09 DIAGNOSIS — R188 Other ascites: Secondary | ICD-10-CM | POA: Diagnosis not present

## 2023-04-09 DIAGNOSIS — E870 Hyperosmolality and hypernatremia: Secondary | ICD-10-CM | POA: Diagnosis not present

## 2023-04-10 DIAGNOSIS — G8918 Other acute postprocedural pain: Secondary | ICD-10-CM | POA: Diagnosis not present

## 2023-04-11 DIAGNOSIS — G8918 Other acute postprocedural pain: Secondary | ICD-10-CM | POA: Diagnosis not present

## 2023-04-11 DIAGNOSIS — F1190 Opioid use, unspecified, uncomplicated: Secondary | ICD-10-CM | POA: Diagnosis not present

## 2023-04-12 DIAGNOSIS — K5900 Constipation, unspecified: Secondary | ICD-10-CM | POA: Diagnosis not present

## 2023-04-12 DIAGNOSIS — R140 Abdominal distension (gaseous): Secondary | ICD-10-CM | POA: Diagnosis not present

## 2023-04-12 DIAGNOSIS — G8918 Other acute postprocedural pain: Secondary | ICD-10-CM | POA: Diagnosis not present

## 2023-04-12 DIAGNOSIS — F1190 Opioid use, unspecified, uncomplicated: Secondary | ICD-10-CM | POA: Diagnosis not present

## 2023-04-13 DIAGNOSIS — G8918 Other acute postprocedural pain: Secondary | ICD-10-CM | POA: Diagnosis not present

## 2023-04-13 DIAGNOSIS — F1190 Opioid use, unspecified, uncomplicated: Secondary | ICD-10-CM | POA: Diagnosis not present

## 2023-04-14 DIAGNOSIS — G8918 Other acute postprocedural pain: Secondary | ICD-10-CM | POA: Diagnosis not present

## 2023-04-14 DIAGNOSIS — R079 Chest pain, unspecified: Secondary | ICD-10-CM | POA: Diagnosis not present

## 2023-04-15 DIAGNOSIS — R918 Other nonspecific abnormal finding of lung field: Secondary | ICD-10-CM | POA: Diagnosis not present

## 2023-04-15 DIAGNOSIS — S31109A Unspecified open wound of abdominal wall, unspecified quadrant without penetration into peritoneal cavity, initial encounter: Secondary | ICD-10-CM | POA: Diagnosis not present

## 2023-04-15 DIAGNOSIS — T8130XA Disruption of wound, unspecified, initial encounter: Secondary | ICD-10-CM | POA: Diagnosis not present

## 2023-04-15 DIAGNOSIS — R609 Edema, unspecified: Secondary | ICD-10-CM | POA: Diagnosis not present

## 2023-04-17 DIAGNOSIS — K631 Perforation of intestine (nontraumatic): Secondary | ICD-10-CM | POA: Diagnosis not present

## 2023-04-17 DIAGNOSIS — J984 Other disorders of lung: Secondary | ICD-10-CM | POA: Diagnosis not present

## 2023-04-17 DIAGNOSIS — D72829 Elevated white blood cell count, unspecified: Secondary | ICD-10-CM | POA: Diagnosis not present

## 2023-04-17 DIAGNOSIS — B3789 Other sites of candidiasis: Secondary | ICD-10-CM | POA: Diagnosis not present

## 2023-04-17 DIAGNOSIS — K821 Hydrops of gallbladder: Secondary | ICD-10-CM | POA: Diagnosis not present

## 2023-04-17 DIAGNOSIS — K5780 Diverticulitis of intestine, part unspecified, with perforation and abscess without bleeding: Secondary | ICD-10-CM | POA: Diagnosis not present

## 2023-04-18 DIAGNOSIS — J9691 Respiratory failure, unspecified with hypoxia: Secondary | ICD-10-CM | POA: Diagnosis not present

## 2023-04-18 DIAGNOSIS — S00522A Blister (nonthermal) of oral cavity, initial encounter: Secondary | ICD-10-CM | POA: Diagnosis not present

## 2023-04-18 DIAGNOSIS — J449 Chronic obstructive pulmonary disease, unspecified: Secondary | ICD-10-CM | POA: Diagnosis not present

## 2023-04-18 DIAGNOSIS — R000 Tachycardia, unspecified: Secondary | ICD-10-CM | POA: Diagnosis not present

## 2023-04-18 DIAGNOSIS — R451 Restlessness and agitation: Secondary | ICD-10-CM | POA: Diagnosis not present

## 2023-04-19 DIAGNOSIS — R000 Tachycardia, unspecified: Secondary | ICD-10-CM | POA: Diagnosis not present

## 2023-04-19 DIAGNOSIS — R451 Restlessness and agitation: Secondary | ICD-10-CM | POA: Diagnosis not present

## 2023-04-19 DIAGNOSIS — S00522A Blister (nonthermal) of oral cavity, initial encounter: Secondary | ICD-10-CM | POA: Diagnosis not present

## 2023-04-19 DIAGNOSIS — J449 Chronic obstructive pulmonary disease, unspecified: Secondary | ICD-10-CM | POA: Diagnosis not present

## 2023-04-19 DIAGNOSIS — J9691 Respiratory failure, unspecified with hypoxia: Secondary | ICD-10-CM | POA: Diagnosis not present

## 2023-04-20 DIAGNOSIS — B3789 Other sites of candidiasis: Secondary | ICD-10-CM | POA: Diagnosis not present

## 2023-04-20 DIAGNOSIS — I82C11 Acute embolism and thrombosis of right internal jugular vein: Secondary | ICD-10-CM | POA: Diagnosis not present

## 2023-04-20 DIAGNOSIS — I82613 Acute embolism and thrombosis of superficial veins of upper extremity, bilateral: Secondary | ICD-10-CM | POA: Diagnosis not present

## 2023-04-20 DIAGNOSIS — K5780 Diverticulitis of intestine, part unspecified, with perforation and abscess without bleeding: Secondary | ICD-10-CM | POA: Diagnosis not present

## 2023-04-20 DIAGNOSIS — D72829 Elevated white blood cell count, unspecified: Secondary | ICD-10-CM | POA: Diagnosis not present

## 2023-04-20 DIAGNOSIS — Z86718 Personal history of other venous thrombosis and embolism: Secondary | ICD-10-CM | POA: Diagnosis not present

## 2023-04-20 DIAGNOSIS — R509 Fever, unspecified: Secondary | ICD-10-CM | POA: Diagnosis not present

## 2023-04-21 DIAGNOSIS — K631 Perforation of intestine (nontraumatic): Secondary | ICD-10-CM | POA: Diagnosis not present

## 2023-04-21 DIAGNOSIS — B3789 Other sites of candidiasis: Secondary | ICD-10-CM | POA: Diagnosis not present

## 2023-04-21 DIAGNOSIS — B9620 Unspecified Escherichia coli [E. coli] as the cause of diseases classified elsewhere: Secondary | ICD-10-CM | POA: Diagnosis not present

## 2023-04-21 DIAGNOSIS — K5720 Diverticulitis of large intestine with perforation and abscess without bleeding: Secondary | ICD-10-CM | POA: Diagnosis not present

## 2023-04-21 DIAGNOSIS — B966 Bacteroides fragilis [B. fragilis] as the cause of diseases classified elsewhere: Secondary | ICD-10-CM | POA: Diagnosis not present

## 2023-04-22 DIAGNOSIS — R451 Restlessness and agitation: Secondary | ICD-10-CM | POA: Diagnosis not present

## 2023-04-22 DIAGNOSIS — D72829 Elevated white blood cell count, unspecified: Secondary | ICD-10-CM | POA: Diagnosis not present

## 2023-04-22 DIAGNOSIS — J9691 Respiratory failure, unspecified with hypoxia: Secondary | ICD-10-CM | POA: Diagnosis not present

## 2023-04-22 DIAGNOSIS — B3789 Other sites of candidiasis: Secondary | ICD-10-CM | POA: Diagnosis not present

## 2023-04-22 DIAGNOSIS — J449 Chronic obstructive pulmonary disease, unspecified: Secondary | ICD-10-CM | POA: Diagnosis not present

## 2023-04-22 DIAGNOSIS — R000 Tachycardia, unspecified: Secondary | ICD-10-CM | POA: Diagnosis not present

## 2023-04-22 DIAGNOSIS — K5720 Diverticulitis of large intestine with perforation and abscess without bleeding: Secondary | ICD-10-CM | POA: Diagnosis not present

## 2023-04-22 DIAGNOSIS — S00522A Blister (nonthermal) of oral cavity, initial encounter: Secondary | ICD-10-CM | POA: Diagnosis not present

## 2023-04-22 DIAGNOSIS — B966 Bacteroides fragilis [B. fragilis] as the cause of diseases classified elsewhere: Secondary | ICD-10-CM | POA: Diagnosis not present

## 2023-04-22 DIAGNOSIS — B9620 Unspecified Escherichia coli [E. coli] as the cause of diseases classified elsewhere: Secondary | ICD-10-CM | POA: Diagnosis not present

## 2023-04-22 DIAGNOSIS — R109 Unspecified abdominal pain: Secondary | ICD-10-CM | POA: Diagnosis not present

## 2023-04-22 DIAGNOSIS — Z0389 Encounter for observation for other suspected diseases and conditions ruled out: Secondary | ICD-10-CM | POA: Diagnosis not present

## 2023-04-23 DIAGNOSIS — J9691 Respiratory failure, unspecified with hypoxia: Secondary | ICD-10-CM | POA: Diagnosis not present

## 2023-04-23 DIAGNOSIS — J449 Chronic obstructive pulmonary disease, unspecified: Secondary | ICD-10-CM | POA: Diagnosis not present

## 2023-04-23 DIAGNOSIS — R000 Tachycardia, unspecified: Secondary | ICD-10-CM | POA: Diagnosis not present

## 2023-04-23 DIAGNOSIS — K5720 Diverticulitis of large intestine with perforation and abscess without bleeding: Secondary | ICD-10-CM | POA: Diagnosis not present

## 2023-04-23 DIAGNOSIS — B966 Bacteroides fragilis [B. fragilis] as the cause of diseases classified elsewhere: Secondary | ICD-10-CM | POA: Diagnosis not present

## 2023-04-23 DIAGNOSIS — B3789 Other sites of candidiasis: Secondary | ICD-10-CM | POA: Diagnosis not present

## 2023-04-23 DIAGNOSIS — S00522A Blister (nonthermal) of oral cavity, initial encounter: Secondary | ICD-10-CM | POA: Diagnosis not present

## 2023-04-23 DIAGNOSIS — B9620 Unspecified Escherichia coli [E. coli] as the cause of diseases classified elsewhere: Secondary | ICD-10-CM | POA: Diagnosis not present

## 2023-04-23 DIAGNOSIS — R109 Unspecified abdominal pain: Secondary | ICD-10-CM | POA: Diagnosis not present

## 2023-04-23 DIAGNOSIS — R451 Restlessness and agitation: Secondary | ICD-10-CM | POA: Diagnosis not present

## 2023-04-24 DIAGNOSIS — R000 Tachycardia, unspecified: Secondary | ICD-10-CM | POA: Diagnosis not present

## 2023-04-24 DIAGNOSIS — K651 Peritoneal abscess: Secondary | ICD-10-CM | POA: Diagnosis not present

## 2023-04-24 DIAGNOSIS — K5720 Diverticulitis of large intestine with perforation and abscess without bleeding: Secondary | ICD-10-CM | POA: Diagnosis not present

## 2023-04-24 DIAGNOSIS — R188 Other ascites: Secondary | ICD-10-CM | POA: Diagnosis not present

## 2023-04-24 DIAGNOSIS — Z9889 Other specified postprocedural states: Secondary | ICD-10-CM | POA: Diagnosis not present

## 2023-04-24 DIAGNOSIS — B379 Candidiasis, unspecified: Secondary | ICD-10-CM | POA: Diagnosis not present

## 2023-04-24 DIAGNOSIS — K631 Perforation of intestine (nontraumatic): Secondary | ICD-10-CM | POA: Diagnosis not present

## 2023-04-24 DIAGNOSIS — S00522A Blister (nonthermal) of oral cavity, initial encounter: Secondary | ICD-10-CM | POA: Diagnosis not present

## 2023-04-24 DIAGNOSIS — J449 Chronic obstructive pulmonary disease, unspecified: Secondary | ICD-10-CM | POA: Diagnosis not present

## 2023-04-24 DIAGNOSIS — J988 Other specified respiratory disorders: Secondary | ICD-10-CM | POA: Diagnosis not present

## 2023-04-25 DIAGNOSIS — E039 Hypothyroidism, unspecified: Secondary | ICD-10-CM | POA: Diagnosis not present

## 2023-04-25 DIAGNOSIS — B3789 Other sites of candidiasis: Secondary | ICD-10-CM | POA: Diagnosis not present

## 2023-04-25 DIAGNOSIS — D72829 Elevated white blood cell count, unspecified: Secondary | ICD-10-CM | POA: Diagnosis not present

## 2023-04-25 DIAGNOSIS — J988 Other specified respiratory disorders: Secondary | ICD-10-CM | POA: Diagnosis not present

## 2023-04-25 DIAGNOSIS — K5780 Diverticulitis of intestine, part unspecified, with perforation and abscess without bleeding: Secondary | ICD-10-CM | POA: Diagnosis not present

## 2023-04-25 DIAGNOSIS — J449 Chronic obstructive pulmonary disease, unspecified: Secondary | ICD-10-CM | POA: Diagnosis not present

## 2023-04-25 DIAGNOSIS — R000 Tachycardia, unspecified: Secondary | ICD-10-CM | POA: Diagnosis not present

## 2023-04-26 DIAGNOSIS — R000 Tachycardia, unspecified: Secondary | ICD-10-CM | POA: Diagnosis not present

## 2023-04-26 DIAGNOSIS — E039 Hypothyroidism, unspecified: Secondary | ICD-10-CM | POA: Diagnosis not present

## 2023-04-26 DIAGNOSIS — J988 Other specified respiratory disorders: Secondary | ICD-10-CM | POA: Diagnosis not present

## 2023-04-26 DIAGNOSIS — J449 Chronic obstructive pulmonary disease, unspecified: Secondary | ICD-10-CM | POA: Diagnosis not present

## 2023-04-27 DIAGNOSIS — J988 Other specified respiratory disorders: Secondary | ICD-10-CM | POA: Diagnosis not present

## 2023-04-27 DIAGNOSIS — R000 Tachycardia, unspecified: Secondary | ICD-10-CM | POA: Diagnosis not present

## 2023-04-27 DIAGNOSIS — R451 Restlessness and agitation: Secondary | ICD-10-CM | POA: Diagnosis not present

## 2023-04-27 DIAGNOSIS — J449 Chronic obstructive pulmonary disease, unspecified: Secondary | ICD-10-CM | POA: Diagnosis not present

## 2023-04-28 DIAGNOSIS — J988 Other specified respiratory disorders: Secondary | ICD-10-CM | POA: Diagnosis not present

## 2023-04-28 DIAGNOSIS — R000 Tachycardia, unspecified: Secondary | ICD-10-CM | POA: Diagnosis not present

## 2023-04-28 DIAGNOSIS — E162 Hypoglycemia, unspecified: Secondary | ICD-10-CM | POA: Diagnosis not present

## 2023-04-28 DIAGNOSIS — J449 Chronic obstructive pulmonary disease, unspecified: Secondary | ICD-10-CM | POA: Diagnosis not present

## 2023-04-29 DIAGNOSIS — J45909 Unspecified asthma, uncomplicated: Secondary | ICD-10-CM | POA: Diagnosis not present

## 2023-04-29 DIAGNOSIS — G935 Compression of brain: Secondary | ICD-10-CM | POA: Diagnosis not present

## 2023-04-29 DIAGNOSIS — K631 Perforation of intestine (nontraumatic): Secondary | ICD-10-CM | POA: Diagnosis not present

## 2023-04-29 DIAGNOSIS — G629 Polyneuropathy, unspecified: Secondary | ICD-10-CM | POA: Diagnosis not present

## 2023-04-29 DIAGNOSIS — R7881 Bacteremia: Secondary | ICD-10-CM | POA: Diagnosis not present

## 2023-04-30 DIAGNOSIS — R7881 Bacteremia: Secondary | ICD-10-CM | POA: Diagnosis not present

## 2023-05-05 DIAGNOSIS — K651 Peritoneal abscess: Secondary | ICD-10-CM | POA: Diagnosis not present

## 2023-05-05 DIAGNOSIS — R7881 Bacteremia: Secondary | ICD-10-CM | POA: Diagnosis not present

## 2023-05-08 DIAGNOSIS — K651 Peritoneal abscess: Secondary | ICD-10-CM | POA: Diagnosis not present

## 2023-05-08 DIAGNOSIS — K449 Diaphragmatic hernia without obstruction or gangrene: Secondary | ICD-10-CM | POA: Diagnosis not present

## 2023-05-08 DIAGNOSIS — G935 Compression of brain: Secondary | ICD-10-CM | POA: Diagnosis not present

## 2023-05-08 DIAGNOSIS — R7881 Bacteremia: Secondary | ICD-10-CM | POA: Diagnosis not present

## 2023-05-08 DIAGNOSIS — G629 Polyneuropathy, unspecified: Secondary | ICD-10-CM | POA: Diagnosis not present

## 2023-05-08 DIAGNOSIS — J45909 Unspecified asthma, uncomplicated: Secondary | ICD-10-CM | POA: Diagnosis not present

## 2023-05-08 DIAGNOSIS — K631 Perforation of intestine (nontraumatic): Secondary | ICD-10-CM | POA: Diagnosis not present

## 2023-05-12 DIAGNOSIS — Z0389 Encounter for observation for other suspected diseases and conditions ruled out: Secondary | ICD-10-CM | POA: Diagnosis not present

## 2023-05-12 DIAGNOSIS — G935 Compression of brain: Secondary | ICD-10-CM | POA: Diagnosis not present

## 2023-05-12 DIAGNOSIS — Z87898 Personal history of other specified conditions: Secondary | ICD-10-CM | POA: Diagnosis not present

## 2023-05-12 DIAGNOSIS — F17210 Nicotine dependence, cigarettes, uncomplicated: Secondary | ICD-10-CM | POA: Diagnosis not present

## 2023-05-12 DIAGNOSIS — Z933 Colostomy status: Secondary | ICD-10-CM | POA: Diagnosis not present

## 2023-05-12 DIAGNOSIS — D649 Anemia, unspecified: Secondary | ICD-10-CM | POA: Diagnosis not present

## 2023-05-13 DIAGNOSIS — R109 Unspecified abdominal pain: Secondary | ICD-10-CM | POA: Diagnosis not present

## 2023-05-14 DIAGNOSIS — D649 Anemia, unspecified: Secondary | ICD-10-CM | POA: Diagnosis not present

## 2023-05-14 DIAGNOSIS — Z933 Colostomy status: Secondary | ICD-10-CM | POA: Diagnosis not present

## 2023-05-14 DIAGNOSIS — R109 Unspecified abdominal pain: Secondary | ICD-10-CM | POA: Diagnosis not present

## 2023-05-14 DIAGNOSIS — K5780 Diverticulitis of intestine, part unspecified, with perforation and abscess without bleeding: Secondary | ICD-10-CM | POA: Diagnosis not present

## 2023-05-15 DIAGNOSIS — J45909 Unspecified asthma, uncomplicated: Secondary | ICD-10-CM | POA: Diagnosis not present

## 2023-05-15 DIAGNOSIS — B182 Chronic viral hepatitis C: Secondary | ICD-10-CM | POA: Diagnosis not present

## 2023-05-15 DIAGNOSIS — K651 Peritoneal abscess: Secondary | ICD-10-CM | POA: Diagnosis not present

## 2023-05-15 DIAGNOSIS — R188 Other ascites: Secondary | ICD-10-CM | POA: Diagnosis not present

## 2023-05-15 DIAGNOSIS — F3181 Bipolar II disorder: Secondary | ICD-10-CM | POA: Diagnosis not present

## 2023-05-23 ENCOUNTER — Encounter (HOSPITAL_COMMUNITY): Payer: Self-pay

## 2023-05-23 ENCOUNTER — Emergency Department (HOSPITAL_COMMUNITY)

## 2023-05-23 ENCOUNTER — Other Ambulatory Visit: Payer: Self-pay

## 2023-05-23 ENCOUNTER — Observation Stay (HOSPITAL_COMMUNITY)
Admission: EM | Admit: 2023-05-23 | Discharge: 2023-05-25 | Disposition: A | Discharge status: Home / Self Care | Attending: Emergency Medicine | Admitting: Emergency Medicine

## 2023-05-23 DIAGNOSIS — F1190 Opioid use, unspecified, uncomplicated: Secondary | ICD-10-CM | POA: Insufficient documentation

## 2023-05-23 DIAGNOSIS — A419 Sepsis, unspecified organism: Principal | ICD-10-CM

## 2023-05-23 DIAGNOSIS — F445 Conversion disorder with seizures or convulsions: Secondary | ICD-10-CM | POA: Diagnosis not present

## 2023-05-23 DIAGNOSIS — F319 Bipolar disorder, unspecified: Secondary | ICD-10-CM | POA: Insufficient documentation

## 2023-05-23 DIAGNOSIS — Z79899 Other long term (current) drug therapy: Secondary | ICD-10-CM | POA: Insufficient documentation

## 2023-05-23 DIAGNOSIS — M549 Dorsalgia, unspecified: Secondary | ICD-10-CM | POA: Diagnosis not present

## 2023-05-23 DIAGNOSIS — J189 Pneumonia, unspecified organism: Principal | ICD-10-CM | POA: Diagnosis present

## 2023-05-23 DIAGNOSIS — Z933 Colostomy status: Secondary | ICD-10-CM | POA: Diagnosis not present

## 2023-05-23 DIAGNOSIS — E872 Acidosis, unspecified: Secondary | ICD-10-CM | POA: Diagnosis not present

## 2023-05-23 DIAGNOSIS — R058 Other specified cough: Secondary | ICD-10-CM | POA: Diagnosis not present

## 2023-05-23 DIAGNOSIS — R079 Chest pain, unspecified: Secondary | ICD-10-CM | POA: Diagnosis not present

## 2023-05-23 DIAGNOSIS — K5780 Diverticulitis of intestine, part unspecified, with perforation and abscess without bleeding: Secondary | ICD-10-CM | POA: Insufficient documentation

## 2023-05-23 DIAGNOSIS — D509 Iron deficiency anemia, unspecified: Secondary | ICD-10-CM | POA: Diagnosis not present

## 2023-05-23 DIAGNOSIS — R000 Tachycardia, unspecified: Secondary | ICD-10-CM | POA: Insufficient documentation

## 2023-05-23 DIAGNOSIS — J168 Pneumonia due to other specified infectious organisms: Secondary | ICD-10-CM | POA: Diagnosis not present

## 2023-05-23 DIAGNOSIS — R0602 Shortness of breath: Secondary | ICD-10-CM | POA: Diagnosis present

## 2023-05-23 DIAGNOSIS — R918 Other nonspecific abnormal finding of lung field: Secondary | ICD-10-CM | POA: Diagnosis not present

## 2023-05-23 LAB — CBC WITH DIFFERENTIAL/PLATELET
Abs Immature Granulocytes: 0.1 10*3/uL — ABNORMAL HIGH (ref 0.00–0.07)
Basophils Absolute: 0.1 10*3/uL (ref 0.0–0.1)
Basophils Relative: 0 %
Eosinophils Absolute: 0.1 10*3/uL (ref 0.0–0.5)
Eosinophils Relative: 0 %
HCT: 27.9 % — ABNORMAL LOW (ref 36.0–46.0)
Hemoglobin: 8.1 g/dL — ABNORMAL LOW (ref 12.0–15.0)
Immature Granulocytes: 1 %
Lymphocytes Relative: 12 %
Lymphs Abs: 1.6 10*3/uL (ref 0.7–4.0)
MCH: 26 pg (ref 26.0–34.0)
MCHC: 29 g/dL — ABNORMAL LOW (ref 30.0–36.0)
MCV: 89.4 fL (ref 80.0–100.0)
Monocytes Absolute: 1.1 10*3/uL — ABNORMAL HIGH (ref 0.1–1.0)
Monocytes Relative: 8 %
Neutro Abs: 11.2 10*3/uL — ABNORMAL HIGH (ref 1.7–7.7)
Neutrophils Relative %: 79 %
Platelets: 482 10*3/uL — ABNORMAL HIGH (ref 150–400)
RBC: 3.12 MIL/uL — ABNORMAL LOW (ref 3.87–5.11)
RDW: 17.4 % — ABNORMAL HIGH (ref 11.5–15.5)
Smear Review: NORMAL
WBC Morphology: INCREASED
WBC: 14.1 10*3/uL — ABNORMAL HIGH (ref 4.0–10.5)
nRBC: 0 % (ref 0.0–0.2)

## 2023-05-23 LAB — I-STAT CG4 LACTIC ACID, ED: Lactic Acid, Venous: 2.6 mmol/L (ref 0.5–1.9)

## 2023-05-23 LAB — BASIC METABOLIC PANEL
Anion gap: 11 (ref 5–15)
BUN: 8 mg/dL (ref 6–20)
CO2: 24 mmol/L (ref 22–32)
Calcium: 8.8 mg/dL — ABNORMAL LOW (ref 8.9–10.3)
Chloride: 98 mmol/L (ref 98–111)
Creatinine, Ser: 0.69 mg/dL (ref 0.44–1.00)
GFR, Estimated: >60 mL/min (ref >60)
Glucose, Bld: 175 mg/dL — ABNORMAL HIGH (ref 70–99)
Potassium: 3.9 mmol/L (ref 3.5–5.1)
Sodium: 133 mmol/L — ABNORMAL LOW (ref 135–145)

## 2023-05-23 MED ORDER — LACTATED RINGERS IV BOLUS (SEPSIS)
1000.0000 mL | Freq: Once | INTRAVENOUS | Status: AC
  Administered 2023-05-23: 1000 mL via INTRAVENOUS

## 2023-05-23 MED ORDER — LACTATED RINGERS IV SOLN: INTRAVENOUS | Status: DC

## 2023-05-23 MED ORDER — METRONIDAZOLE 500 MG/100ML IV SOLN
500.0000 mg | Freq: Once | INTRAVENOUS | Status: DC
  Administered 2023-05-24: 500 mg via INTRAVENOUS
  Filled 2023-05-23: qty 100

## 2023-05-23 MED ORDER — VANCOMYCIN HCL IN DEXTROSE 1-5 GM/200ML-% IV SOLN
1000.0000 mg | Freq: Once | INTRAVENOUS | Status: DC
  Administered 2023-05-24: 1000 mg via INTRAVENOUS
  Filled 2023-05-23: qty 200

## 2023-05-23 MED ORDER — ACETAMINOPHEN 500 MG PO TABS
1000.0000 mg | ORAL_TABLET | Freq: Once | ORAL | Status: AC
  Administered 2023-05-24: 1000 mg via ORAL
  Filled 2023-05-23: qty 2

## 2023-05-23 MED ORDER — LACTATED RINGERS IV BOLUS
1000.0000 mL | Freq: Once | INTRAVENOUS | Status: DC
Start: 2023-05-24 — End: 2023-05-24

## 2023-05-23 MED ORDER — SODIUM CHLORIDE 0.9 % IV SOLN
2.0000 g | Freq: Once | INTRAVENOUS | Status: AC
  Administered 2023-05-24: 2 g via INTRAVENOUS
  Filled 2023-05-23: qty 12.5

## 2023-05-23 NOTE — ED Triage Notes (Signed)
 Pt c/o productive cough w/yellow mucous, HA started this morning. Pt states her husband says she's disoriented.

## 2023-05-23 NOTE — ED Provider Notes (Signed)
 [facility] DEPT [facility] Emergency Department Provider Note MRN:  [id number]  Arrival date & time: 05/23/23     Chief Complaint   Cough   History of Present Illness   Barbara Simon is a 48 y.o. year-old female presents to the ED with chief complaint of SOB.  Reports associated chest pain, rib pain, and bad cough.  Had some chest tightness and rib pain yesterday.  Acutely worsened today.  Has had post-tussive emesis.  Husband reports she has been a bit confused today.  Had prolonged recent hospitalization for ruptured colon and was on a ventilator for 2 weeks.  History provided by patient.   Review of Systems  Pertinent positive and negative review of systems noted in HPI.    Physical Exam   Vitals:   05/23/23 1856 05/23/23 2138  BP: 104/65 109/71  Pulse: (!) 125 (!) 135  Resp: (!) 21 (!) 21  Temp: 98.1 F (36.7 C) (!) 100.6 F (38.1 C)  SpO2: 98% 97%    CONSTITUTIONAL:  ill-appearing, NAD NEURO:  Alert and oriented x 3, CN 3-12 grossly intact EYES:  eyes equal and reactive ENT/NECK:  Supple, no stridor  CARDIO:  tachycardic, regular rhythm, appears well-perfused  PULM:  Increased WOB, coarse lung sounds, rales GI/GU:  non-distended,  MSK/SPINE:  No gross deformities, no edema, moves all extremities  SKIN:  no rash, atraumatic   *Additional and/or pertinent findings included in MDM below  Diagnostic and Interventional Summary    EKG Interpretation Date/Time:    Ventricular Rate:    PR Interval:    QRS Duration:    QT Interval:    QTC Calculation:   R Axis:      Text Interpretation:         Labs Reviewed  BASIC METABOLIC PANEL - Abnormal; Notable for the following components:      Result Value   Sodium 133 (*)    Glucose, Bld 175 (*)    Calcium 8.8 (*)    All other components within normal limits  CBC WITH DIFFERENTIAL/PLATELET - Abnormal; Notable for the following components:   WBC 14.1 (*)    RBC 3.12 (*)    Hemoglobin 8.1 (*)     HCT 27.9 (*)    MCHC 29.0 (*)    RDW 17.4 (*)    Platelets 482 (*)    Neutro Abs 11.2 (*)    Monocytes Absolute 1.1 (*)    Abs Immature Granulocytes 0.10 (*)    All other components within normal limits  CULTURE, BLOOD (SINGLE)  I-STAT CG4 LACTIC ACID, ED    DG Chest 2 View  Final Result      Medications  lactated ringers infusion (has no administration in time range)  lactated ringers bolus 1,000 mL (has no administration in time range)  ceFEPIme (MAXIPIME) 2 g in sodium chloride 0.9 % 100 mL IVPB (has no administration in time range)  metroNIDAZOLE (FLAGYL) IVPB 500 mg (has no administration in time range)  vancomycin (VANCOCIN) IVPB 1000 mg/200 mL premix (has no administration in time range)  acetaminophen (TYLENOL) tablet 1,000 mg (has no administration in time range)     Procedures  /  Critical Care .Critical Care  Performed by: Roxy Horseman, PA-C Authorized by: Roxy Horseman, PA-C   Critical care provider statement:    Critical care time (minutes):  36   Critical care was necessary to treat or prevent imminent or life-threatening deterioration of the following conditions:  Sepsis   Critical  care was time spent personally by me on the following activities:  Development of treatment plan with patient or surrogate, discussions with consultants, evaluation of patient's response to treatment, examination of patient, ordering and review of laboratory studies, ordering and review of radiographic studies, ordering and performing treatments and interventions, pulse oximetry, re-evaluation of patient's condition and review of old charts   ED Course and Medical Decision Making  I have reviewed the triage vital signs, the nursing notes, and pertinent available records from the EMR.  Social Determinants Affecting Complexity of Care: Patient has no clinically significant social determinants affecting this chief complaint..   ED Course:    Medical Decision Making Patient  here with fever, cough, and shortness of breath.  Onset of symptoms was yesterday and progressively worsened today.  Labs ordered in triage are notable for leukocytosis, chest x-ray shows right-sided pneumonia, patient noted to be febrile and tachycardic.  Code sepsis activated.  Will start fluids.  First lactic is pending.  Will start broad-spectrum antibiotics.  Patient will need admission to the hospital.  Amount and/or Complexity of Data Reviewed Labs: ordered.    Details: Leukocytosis to 14 with fever and pneumonia.  Will treat with broad spectrum abx. Radiology: ordered and independent interpretation performed.    Details: Right lower opacity  Risk OTC drugs. Prescription drug management. Decision regarding hospitalization.         Consultants: I consulted with internal medicine residents, who are appreciated for admitting.   Treatment and Plan: Patient's exam and diagnostic results are concerning for SIRS and pneumonia.  Feel that patient will need admission to the hospital for further treatment and evaluation.    Final Clinical Impressions(s) / ED Diagnoses     ICD-10-CM   1. Sepsis, due to unspecified organism, unspecified whether acute organ dysfunction present (HCC)  A41.9     2. Pneumonia due to infectious organism, unspecified laterality, unspecified part of lung  J18.9       ED Discharge Orders     None         Discharge Instructions Discussed with and Provided to Patient:   Discharge Instructions   None      Roxy Horseman, PA-C 05/23/23 2348    Royanne Foots, DO 05/28/23 1443

## 2023-05-23 NOTE — Sepsis Progress Note (Signed)
 Elink following code sepsis

## 2023-05-23 NOTE — H&P (Incomplete)
 Date: 05/24/2023               Patient Name:  Barbara Simon MRN: [id number]  DOB: 16-Sep-1975 Age / Sex: 48 y.o., female   PCP: Monna Fam, MD         Medical Service: Internal Medicine Teaching Service         Attending Physician: Dr. Dickie La, MD      First Contact: Dr. Faith Rogue, DO Pager [phone]    Second Contact: Dr. Champ Mungo, DO Pager [phone]         After Hours (After 5p/  First Contact Pager: [phone]  weekends / holidays): Second Contact Pager: [phone]   SUBJECTIVE   Chief Complaint: Dyspnea, cough  History of Present Illness:  Barbara Barbara Simon is a 48 yo F with PMH of recent diverticular bowel perforation requiring intubation and with ostomy present as well as bipolar disorder, asthma, OUD on suboxone, and non-epileptic seizures.  She presents to [facility] ED on 05/24/23 with complaint of dyspnea, cough with yellow sputum, malaise, and headache.  Yesterday, she felt generally well except she was having some chest tightness. Today, she is short of breath and has worsening cough with post-tussive emesis, fatigue, headache, dizziness with movement, and diffuse pressure-like pain across the entire chest and back. These symptoms have progressed through the day. She decided to come to the ED when her husband returned from work and noted she looked pale. She denies sick contacts. She denies known fevers or chills. She was not able to eat or drink her full diet today.  Earlier this year, she was hospitalized in the [facility] system for a diverticular bowel perforation with subsequent abdominal surgery, 2-week intubation, and now an ostomy. She was treated for six weeks thereafter with Zosyn and fluconazole, with therapy completed and the end of February. She was hospitalized again at the end of February for anemia and received iron.  She has moderate pain in her abdomen from above but states it is not worse than usual today.   Her husband changes her ostomy tube and dressings and  states her surgical wound is healing and does not appear infected.  ED Course: Vitals: 98.48F -> 100.47F, HR 135, BP 104/65, 98% O2 on ra, RR 21 CXR concerning for pneumonia Sepsis protocol activated, received LR, vanc, cefepime, flagyl Lactic acid 2.6 IMTS consulted for admission  Past Medical History Early 2025: Diverticular bowel perforation requiring intubation/vasopressors and with ostomy present  Bipolar 1 disorder Well-controlled asthma OUD on suboxone Non-epileptic seizures Hepatitis C  Meds:  Aripiprazole 2mg  every day symbicort 80-4.5 2 puffs BID suboxone 8-2 TID Gabapentin 300 tid  Lamotrigine 100 every day Venlafaxine/effexor 150 qam Protonix 40 every day Miralax prn Ipratroptium neb prn Flonase prn Goody powder Albuterol prn  Past Surgical History Recent abdominal surgery at [facility] system due to ruptured bowel Appendectomy Tubal ligation  Social:  Lives in [place] with husband Occupation: does not work/ disabled Support: From husband and family in [place] Level of Function: requires some help for strenuous activity and with balance, otherwise independent in I/ADLs PCP: Monna Fam, MD Substances: marijuana daily, no alcohol, former smoker quit early 2025  Family History:  Noncontributory  Allergies: Allergies as of 05/23/2023 - Review Complete 05/23/2023  Allergen Reaction Noted   Toradol [ketorolac tromethamine] Hives and Itching 10/22/2011   Adhesive [tape] Itching and Rash 10/22/2011    Review of Systems: A complete ROS was negative except as per HPI.   OBJECTIVE:  Physical Exam: Blood pressure 109/71, pulse (!) 135, temperature (!) 100.6 F (38.1 C), resp. rate (!) 21, height 5\' 3"  (1.6 m), weight 72.6 kg, SpO2 97%.  Constitutional: Ill-appearing. In no acute distress. HENT: Normocephalic, atraumatic,  Cardio: Tachycardic, regular rhythm. No murmurs, rubs, or gallops. 3+ bilateral radial and dorsalis pedis  pulses. Pulm: Rhonchi and  diffuse wheezes. Trace base rales. Normal work of breathing on room air. Abdomen: Soft, mild distension and tenderness without guarding or rebound or tympany. Ostomy is present and clean. Midline surgical scar bandaged without signs of infection or drainage. WUJ:[id number] for extremity edema. Skin:Warm and dry. Neuro:Alert and oriented x3. No focal deficit noted. Psych:Pleasant mood and affect.    Labs: CBC    Component Value Date/Time   WBC 14.1 (H) 05/23/2023 1859   RBC 3.12 (L) 05/23/2023 1859   HGB 8.1 (L) 05/23/2023 1859   HGB 12.9 04/28/2022 1018   HCT 27.9 (L) 05/23/2023 1859   HCT 37.4 04/28/2022 1018   PLT 482 (H) 05/23/2023 1859   PLT 300 04/28/2022 1018   MCV 89.4 05/23/2023 1859   MCV 86 04/28/2022 1018   MCH 26.0 05/23/2023 1859   MCHC 29.0 (L) 05/23/2023 1859   RDW 17.4 (H) 05/23/2023 1859   RDW 14.4 04/28/2022 1018   LYMPHSABS 1.6 05/23/2023 1859   MONOABS 1.1 (H) 05/23/2023 1859   EOSABS 0.1 05/23/2023 1859   BASOSABS 0.1 05/23/2023 1859     CMP     Component Value Date/Time   NA 133 (L) 05/23/2023 1859   NA 139 04/28/2022 1018   K 3.9 05/23/2023 1859   CL 98 05/23/2023 1859   CO2 24 05/23/2023 1859   GLUCOSE 175 (H) 05/23/2023 1859   BUN 8 05/23/2023 1859   BUN 16 04/28/2022 1018   CREATININE 0.69 05/23/2023 1859   CREATININE 0.71 07/28/2022 1345   CALCIUM 8.8 (L) 05/23/2023 1859   PROT 6.1 07/28/2022 1345   PROT 6.1 04/28/2022 1018   ALBUMIN 4.3 04/28/2022 1018   AST 12 07/28/2022 1345   ALT 8 07/28/2022 1345   ALKPHOS 55 04/28/2022 1018   BILITOT 0.3 07/28/2022 1345   BILITOT <0.2 04/28/2022 1018   GFRNONAA >60 05/23/2023 1859   GFRAA >60 09/16/2014 1130   Lactic Acid 2.6  Imaging: CXR:   Right lower lobe airspace opacity concerning for pneumonia. Followup PA and lateral chest X-ray is recommended in 3-4 weeks following trial of antibiotic therapy to ensure resolution and exclude underlying malignancy.  EKG: personally reviewed my  interpretation is sinus tachycardia.  ASSESSMENT & PLAN:   Assessment & Plan by Problem: Principal Problem:   Community acquired pneumonia  Recent perforated diverticulitis, present ostomy, bipolar disorder, asthma, OUD on suboxone, and non-epileptic seizures who presented with cough and dyspnea and admitted for community-acquired pneumonia.  Community Acquired Pneumonia with Sepsis Lactic Acidosis Tachycardia Chest and back pain Barbara Simon is admitted for CAP with sepsis given progressive dyspnea, leukocytosis, lung infiltrate, and lactic acidosis. Fortunately she is not hypoxic. Her lung exam has diffuse wheezes with rhonchi and trace rales. She is hemodynamically stable (PTA blood pressures appear to run low-normal), warm, mentation intact, alleviating concerns for septic shock. Blood cultures are pending. Fluid resuscitation underway, expect she is volume-down. She has a history of asthma and is a former smoker, previous PFTs not diagnistic of COPD. Do not feel she is in an exacerbation. - CAP coverage with rocephin and Zithromax - procal pending - RVP pending - follow blood cultures -  Dulera BID  Recent Diverticular Perforation, subsequent Ostomy Pt was hospitalized and treated in the [facility] system for diverticular perforation in early January - a 2cm perforation of the sigmoid colon with 10cm of colon resected. Severe illness from fecal contamination requiring intubation, vasopressors x 3 weeks. Thereafter treated with Zosyn/fluconazole x 6 weeks and stopped in late February given improving intra-abdominal fluid collections over multiple CT scans. Today, she showed [name] her surgical site and ostomy are clean and without signs of infection. She does not report a change in the nature of her residual pain and her exam is without severe pain, tympany, rebound, guarding. Currently feel that her disease course is consistent with acute pneumonia and less concern for reinfection in her abdomen. Should  her abdominal exam change or her pain worsen severely, would scan. - Consult to wound/ostomy  Iron Deficient Anemia subsequent to abdominal sugery/infection Hg 8.1 on admission. She was hospitalized for this alongside concern of reinfection at [facility] on Feb 26. Workup not suggestive of hemolysis or marrow suppression from Zosyn, but consistent with IDA. She was discharged on 2/28 after receiving iron. She does take Goody powder so would monitor for signs of GI bleed, but her ostomy does not appear to contain blood/melena. - Trend CBC Hg goal > 7 - Avoid NSAIDs - consider iron replacement after acute infection  OUD on suboxone Continue suboxone 8-2  Bipolar disorder Psychogenic nonepileptic Seizures She has occasional myoclonic jerks and speech stutters due to above. Continue home meds: Gabapentin 300 TID Lamotrigine 100 every day Venlafaxine 150 every day   Diet: Normal VTE: SCDs IVF: LR,Bolus Code: Full  Dispo: Admit patient to Observation with expected length of stay less than 2 midnights.  Signed: Katheran James, DO Internal Medicine Resident PGY-1  05/24/2023, 1:09 AM   Daytime contact: Dr. Faith Rogue, DO Pager [phone]

## 2023-05-24 DIAGNOSIS — A419 Sepsis, unspecified organism: Secondary | ICD-10-CM

## 2023-05-24 DIAGNOSIS — J168 Pneumonia due to other specified infectious organisms: Secondary | ICD-10-CM | POA: Diagnosis not present

## 2023-05-24 DIAGNOSIS — J189 Pneumonia, unspecified organism: Secondary | ICD-10-CM | POA: Diagnosis present

## 2023-05-24 LAB — RESPIRATORY PANEL BY PCR

## 2023-05-24 LAB — CBC
HCT: 23.4 % — ABNORMAL LOW (ref 36.0–46.0)
HCT: 25.5 % — ABNORMAL LOW (ref 36.0–46.0)
Hemoglobin: 7 g/dL — ABNORMAL LOW (ref 12.0–15.0)
Hemoglobin: 7.6 g/dL — ABNORMAL LOW (ref 12.0–15.0)
MCH: 26.1 pg (ref 26.0–34.0)
MCH: 26.1 pg (ref 26.0–34.0)
MCHC: 29.9 g/dL — ABNORMAL LOW (ref 30.0–36.0)
MCHC: 29.8 g/dL — ABNORMAL LOW (ref 30.0–36.0)
MCV: 87.3 fL (ref 80.0–100.0)
MCV: 87.6 fL (ref 80.0–100.0)
Platelets: 414 10*3/uL — ABNORMAL HIGH (ref 150–400)
Platelets: 443 10*3/uL — ABNORMAL HIGH (ref 150–400)
RBC: 2.68 MIL/uL — ABNORMAL LOW (ref 3.87–5.11)
RBC: 2.91 MIL/uL — ABNORMAL LOW (ref 3.87–5.11)
RDW: 17.4 % — ABNORMAL HIGH (ref 11.5–15.5)
RDW: 17.3 % — ABNORMAL HIGH (ref 11.5–15.5)
WBC: 15 10*3/uL — ABNORMAL HIGH (ref 4.0–10.5)
WBC: 13 10*3/uL — ABNORMAL HIGH (ref 4.0–10.5)
nRBC: 0.1 % (ref 0.0–0.2)
nRBC: 0 % (ref 0.0–0.2)

## 2023-05-24 LAB — BASIC METABOLIC PANEL
Anion gap: 10 (ref 5–15)
BUN: 8 mg/dL (ref 6–20)
CO2: 22 mmol/L (ref 22–32)
Calcium: 8.3 mg/dL — ABNORMAL LOW (ref 8.9–10.3)
Chloride: 99 mmol/L (ref 98–111)
Creatinine, Ser: 0.54 mg/dL (ref 0.44–1.00)
GFR, Estimated: >60 mL/min (ref >60)
Glucose, Bld: 114 mg/dL — ABNORMAL HIGH (ref 70–99)
Potassium: 4.1 mmol/L (ref 3.5–5.1)
Sodium: 131 mmol/L — ABNORMAL LOW (ref 135–145)

## 2023-05-24 LAB — PROCALCITONIN: Procalcitonin: 3.37 ng/mL

## 2023-05-24 LAB — HIV ANTIBODY (ROUTINE TESTING W REFLEX): HIV Screen 4th Generation wRfx: NONREACTIVE

## 2023-05-24 LAB — I-STAT CG4 LACTIC ACID, ED: Lactic Acid, Venous: 1.3 mmol/L (ref 0.5–1.9)

## 2023-05-24 MED ORDER — IPRATROPIUM BROMIDE 0.02 % IN SOLN
0.5000 mg | Freq: Four times a day (QID) | RESPIRATORY_TRACT | Status: DC
  Administered 2023-05-25: 0.5 mg via RESPIRATORY_TRACT
  Filled 2023-05-24: qty 2.5

## 2023-05-24 MED ORDER — SODIUM CHLORIDE 0.9 % IV SOLN
2.0000 g | Freq: Three times a day (TID) | INTRAVENOUS | Status: DC
  Administered 2023-05-24 – 2023-05-25 (×4): 2 g via INTRAVENOUS
  Filled 2023-05-24 (×4): qty 12.5

## 2023-05-24 MED ORDER — IPRATROPIUM BROMIDE 0.02 % IN SOLN
0.5000 mg | RESPIRATORY_TRACT | Status: DC
  Administered 2023-05-24 (×3): 0.5 mg via RESPIRATORY_TRACT
  Filled 2023-05-24 (×3): qty 2.5

## 2023-05-24 MED ORDER — VENLAFAXINE HCL ER 75 MG PO CP24
150.0000 mg | ORAL_CAPSULE | Freq: Every day | ORAL | Status: DC
  Administered 2023-05-25: 150 mg via ORAL
  Filled 2023-05-24 (×2): qty 2

## 2023-05-24 MED ORDER — BUPRENORPHINE HCL-NALOXONE HCL 8-2 MG SL SUBL
1.0000 | SUBLINGUAL_TABLET | Freq: Three times a day (TID) | SUBLINGUAL | Status: DC
  Administered 2023-05-24 – 2023-05-25 (×3): 1 via SUBLINGUAL
  Filled 2023-05-24 (×4): qty 1

## 2023-05-24 MED ORDER — VANCOMYCIN HCL 750 MG/150ML IV SOLN
750.0000 mg | Freq: Two times a day (BID) | INTRAVENOUS | Status: DC
  Administered 2023-05-24 – 2023-05-25 (×3): 750 mg via INTRAVENOUS
  Filled 2023-05-24 (×3): qty 150

## 2023-05-24 MED ORDER — LACTATED RINGERS IV BOLUS (SEPSIS)
1000.0000 mL | Freq: Once | INTRAVENOUS | Status: AC
  Administered 2023-05-24: 1000 mL via INTRAVENOUS

## 2023-05-24 MED ORDER — GABAPENTIN 300 MG PO CAPS
300.0000 mg | ORAL_CAPSULE | Freq: Three times a day (TID) | ORAL | Status: DC
  Administered 2023-05-24 (×2): 300 mg via ORAL
  Filled 2023-05-24 (×2): qty 1

## 2023-05-24 MED ORDER — SODIUM CHLORIDE 0.9 % IV SOLN
1.0000 g | INTRAVENOUS | Status: DC
  Administered 2023-05-24: 1 g via INTRAVENOUS
  Filled 2023-05-24: qty 10

## 2023-05-24 MED ORDER — GABAPENTIN 300 MG PO CAPS
900.0000 mg | ORAL_CAPSULE | Freq: Three times a day (TID) | ORAL | Status: DC
  Administered 2023-05-24 – 2023-05-25 (×3): 900 mg via ORAL
  Filled 2023-05-24 (×3): qty 3

## 2023-05-24 MED ORDER — MOMETASONE FURO-FORMOTEROL FUM 100-5 MCG/ACT IN AERO
2.0000 | INHALATION_SPRAY | Freq: Two times a day (BID) | RESPIRATORY_TRACT | Status: DC
  Administered 2023-05-25 (×2): 2 via RESPIRATORY_TRACT
  Filled 2023-05-24 (×2): qty 8.8

## 2023-05-24 MED ORDER — ACETAMINOPHEN 500 MG PO TABS
1000.0000 mg | ORAL_TABLET | Freq: Four times a day (QID) | ORAL | Status: DC | PRN
  Administered 2023-05-24 – 2023-05-25 (×4): 1000 mg via ORAL
  Filled 2023-05-24 (×4): qty 2

## 2023-05-24 MED ORDER — LAMOTRIGINE 100 MG PO TABS
100.0000 mg | ORAL_TABLET | Freq: Every day | ORAL | Status: DC
  Administered 2023-05-24 – 2023-05-25 (×2): 100 mg via ORAL
  Filled 2023-05-24: qty 1
  Filled 2023-05-24: qty 4

## 2023-05-24 MED ORDER — GUAIFENESIN-DM 100-10 MG/5ML PO SYRP
5.0000 mL | ORAL_SOLUTION | ORAL | Status: DC | PRN
  Administered 2023-05-24 – 2023-05-25 (×4): 5 mL via ORAL
  Filled 2023-05-24 (×4): qty 5

## 2023-05-24 MED ORDER — ARIPIPRAZOLE 2 MG PO TABS
2.0000 mg | ORAL_TABLET | Freq: Every day | ORAL | Status: DC
  Administered 2023-05-24 – 2023-05-25 (×2): 2 mg via ORAL
  Filled 2023-05-24 (×3): qty 1

## 2023-05-24 MED ORDER — MELATONIN 5 MG PO TABS
5.0000 mg | ORAL_TABLET | Freq: Once | ORAL | Status: AC
  Administered 2023-05-24: 5 mg via ORAL
  Filled 2023-05-24: qty 1

## 2023-05-24 MED ORDER — SODIUM CHLORIDE 0.9 % IV SOLN
500.0000 mg | INTRAVENOUS | Status: DC
  Administered 2023-05-24: 500 mg via INTRAVENOUS
  Filled 2023-05-24: qty 5

## 2023-05-24 MED ORDER — BENZONATATE 100 MG PO CAPS: 200.0000 mg | ORAL_CAPSULE | Freq: Two times a day (BID) | ORAL | Status: DC | PRN

## 2023-05-24 MED ORDER — METHOCARBAMOL 500 MG PO TABS
1000.0000 mg | ORAL_TABLET | Freq: Four times a day (QID) | ORAL | Status: DC | PRN
  Administered 2023-05-25 (×3): 1000 mg via ORAL
  Filled 2023-05-24 (×3): qty 2

## 2023-05-24 NOTE — Consult Note (Signed)
 WOC Nurse Consult Note: Reason for Consult: midline Surgery at outside facility in Jan. 2025, midline is still open but appears to healing well.  Wound type: surgical  Pressure Injury POA: NA Measurement: see nursing flow sheets Wound bed:100% pale, but pink Drainage (amount, consistency, odor) see nursing flow sheets Periwound; intact  Dressing procedure/placement/frequency: Cleanse with saline, pat dry Pack with saline moist gauze, top with ABD pad, secure with tape.  Change BID  WOC Nurse ostomy consult note Stoma type/location: LLQ, colostomy Not able to visualize op report from outside facility Output brown stool in pouch Ostomy pouching: 2pc. 2 3/4" with 2" barrier ring  Education provided:  Wife and patient are changing at home Supply item numbers provided for bedside nursing to change per patient's home routine  Discussed POC with patient and bedside nurse.  Re consult if needed, will not follow at this time. Thanks  Jadira Nierman [employer], RN,CWOCN, CNS, CWON-AP [phone])

## 2023-05-24 NOTE — Plan of Care (Signed)

## 2023-05-24 NOTE — Progress Notes (Addendum)
 Subjective: Barbara Simon is a 48 year old female with a past medical history of recent perforated diverticulitis, present ostomy, bipolar disorder, asthma, OUD on suboxone, and non-epileptic seizures who presented with cough and dyspnea and admitted for community-acquired pneumonia.   Today the patient is frustrated. She reports that we have done "nothing" for her and that she has chest tightness from the cough. She does not feel better since she was admitted. Patient also reports a headache and that her room is "too hot". I offered reassurance that we are treating her with IV antibiotics and that she needs treatment at the hospital and not at home.   Objective:  Vital signs in last 24 hours: Vitals:   05/24/23 0431 05/24/23 0715 05/24/23 0745 05/24/23 0848  BP:  (!) 91/40 (!) 90/59   Pulse:  (!) 115 (!) 107   Resp:  (!) 24 (!) 21   Temp: 98.4 F (36.9 C)   98.4 F (36.9 C)  TempSrc: Oral   Oral  SpO2:  94% 92%   Weight:      Height:       Physical Exam: General:Appears ill, NAD Cardiac: Tachycardic, regular rhythm  Pulmonary: bibasilar inspiratory crackles and diffuse expiratory wheezes present, tachypnea, saturating well on RA Abdominal:soft, bowel sounds present, no evidence of hematochezia or melena in the ostomy  Neuro:awake, alert, participating in conversation  MSK:no pitting edema appreciated  Skin:warm and dry  Psych: normal mood and affect      Latest Ref Rng & Units 05/24/2023    9:41 AM 05/24/2023   12:32 AM 05/23/2023    6:59 PM  CBC  WBC 4.0 - 10.5 K/uL 13.0  15.0  14.1   Hemoglobin 12.0 - 15.0 g/dL 7.6  7.0  8.1   Hematocrit 36.0 - 46.0 % 25.5  23.4  27.9   Platelets 150 - 400 K/uL 443  414  482        Latest Ref Rng & Units 05/24/2023   12:32 AM 05/23/2023    6:59 PM 07/28/2022    1:45 PM  BMP  Glucose 70 - 99 mg/dL 161  096  75   BUN 6 - 20 mg/dL 8  8  7    Creatinine 0.44 - 1.00 mg/dL 0.45  4.09  8.11   BUN/Creat Ratio 6 - 22 (calc)   SEE NOTE:   Sodium  135 - 145 mmol/L 131  133  142   Potassium 3.5 - 5.1 mmol/L 4.1  3.9  3.9   Chloride 98 - 111 mmol/L 99  98  109   CO2 22 - 32 mmol/L 22  24  24    Calcium 8.9 - 10.3 mg/dL 8.3  8.8  9.0      Assessment/Plan:  Principal Problem:   Community acquired pneumonia  Sepsis 2/2 CAP Lactic Acidosis: resolved  Patient remains ill appearing. Her blood pressure was hypotensive this morning and tachycardic from the current sepsis from CAP. Patient has rhonchi on exam, but is fortunately not hypoxic requiring supplemental oxygen support. Lactic acidosis has resolved.  Plan: -Blood culture ordered -Broaden ABX to cefepime and vancomycin given recent hospitalization and prolonged antibiotic course; if MRSA swab is negative, can d/c Vancomycin  -Robitussin DM prn for cough -Tylenol prn for fever and headache  -Ordered IL of LR for blood pressure support  -Ipratropium nebulizer ordered, q4 hours, avoided albuterol with current tachycardia    Acute on chronic anemia  Iron deficiency anemia Iron levels were checked on  2/26 and were remarkable for an iron saturation of 9%, ferritin check 1/16 was 85. Acute anemia is most likley multifactorial due to low production from IDA, sepsis, and dilution from the IVF. Low suspicion for GI bleed at this time, there is no overt evidence of melena or hematochezia on exam.  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 Per patient, this is chronic and there has not been any new changes. Ostomy bag is in place, no erythema surrounding the ostomy. 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  -Continue home meds: -Gabapentin 300 TID -Lamotrigine 100 every day -Venlafaxine 150 every day   Resolved Problems:  __________________________________  Code Status: Full VTE Prophylaxis:SCDs Diet:Regular  IVF:N/A Barriers to Discharge:Treatment of CAP Dispo: Anticipated discharge in approximately 2 day(s).   Faith Rogue, DO 05/24/2023, 10:30 AM Pager: [phone] After 5pm on weekdays and 1pm on weekends: On Call pager [phone]

## 2023-05-24 NOTE — Hospital Course (Addendum)
 Sepsis 2/2 CAP Lactic Acidosis: Resolved  Tachycardia Chest and back pain Barbara Simon is admitted for CAP with sepsis given progressive dyspnea, leukocytosis, lung infiltrate, and lactic acidosis. Fortunately, did not require supplemental oxygen therapy. She was treated with vancomycin and cefepime. The lactic acidosis resolved with IVF. Blood cultures were NGTD, and RVP was negative. Overall, this morning, she is doing well. Reports intermittent cough and chest pain related to coughing fits. She is afebrile and on room air.   Recent Diverticular Perforation, subsequent Ostomy Pt was hospitalized and treated in the [facility] system for diverticular perforation in early January - a 2cm perforation of the sigmoid colon with 10cm of colon resected. Severe illness from fecal contamination requiring intubation, vasopressors x 3 weeks. Thereafter treated with Zosyn/fluconazole x 6 weeks and stopped in late February given improving intra-abdominal fluid collections over multiple CT scans. Surgical site and ostomy are clean and without signs of infection. She does not report a change in the nature of her residual pain and her exam is without severe pain, tympany, rebound, guarding.   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 Continue home meds: Gabapentin 900 TID, Lamotrigine 100 every day, Venlafaxine 150 every day    --------------------------------------------------------------------------------------- Barbara Simon,   You came to the hospital for pneumonia and anemia. We treated you with antibiotics. I am glad that you are feeling improved.   For your Pneumonia -Augmentin, take 1 tablet by mouth two times daily.  When you get home in the evening, please take 1 tablet tonight and start taking twice daily from tomorrow onwards. -Robitussin DM, take 5 mL by mouth as needed for cough   -Please follow up with your PCP for symptoms   For your Iron deficiency Anemia -After you finish your antibiotics please start taking ferrous sulfate, take 1 tablet by mouth with breakfast daily for 30 days  Continue taking the rest of the medications as prescribed   If you have any of these following symptoms, please call us or seek care at an emergency department: -Chest Pain -Difficulty Breathing -Worsening abdominal pain -Syncope (passing out) -Drooping of face -Slurred speech -Sudden weakness in your leg or arm -Fever -Chills -blood in the stool -dark black, sticky stool  If you have any questions or concerns, call our clinic at [phone] or after hours call [phone] and ask for the internal medicine resident on call.  I am glad you are feeling better. It was a pleasure taking care for you. I wish a good recovery and good health!   Dr. Jeral Pinch

## 2023-05-24 NOTE — Progress Notes (Signed)
 Pharmacy Antibiotic Note  Barbara Simon is a 48 y.o. female admitted on 05/23/2023 with pneumonia.  Pharmacy has been consulted for cefepime and vancomycin dosing. Patient admitted at [facility] for perforation bowel in January where she received IV antibiotics including Zosyn.    Plan: Cefepime 2g q8h.  Vancomycin 1000mg  this AM, will start on 750mg  q12h for eAUC: 439. Vd: 0.72, Scr rounded to 0.8.  Follow culture data for de-escalation.  Monitor renal function for dose adjustments as indicated.  F/u MRSA PCR.   Height: 5\' 3"  (160 cm) Weight: 72.6 kg (160 lb 0.9 oz) IBW/kg (Calculated) : 52.4  Temp (24hrs), Avg:98.8 F (37.1 C), Min:98.1 F (36.7 C), Max:100.6 F (38.1 C)  Recent Labs  Lab 05/23/23 1859 05/23/23 2346 05/24/23 0032 05/24/23 0159 05/24/23 0941  WBC 14.1*  --  15.0*  --  13.0*  CREATININE 0.69  --  0.54  --   --   LATICACIDVEN  --  2.6*  --  1.3  --     Estimated Creatinine Clearance: 84.8 mL/min (by C-G formula based on SCr of 0.54 mg/dL).    Allergies  Allergen Reactions   Toradol [Ketorolac Tromethamine] Hives and Itching   Adhesive [Tape] Itching and Rash    Antimicrobials this admission: Cefepime 3/9 >>  Vancomycin 3/9 >>   Dose adjustments this admission:  Microbiology results: 3/9 BCx:  3/9 MRSA PCR:   Thank you for allowing pharmacy to be a part of this patient's care.  Estill Batten, PharmD, BCCCP  05/24/2023 11:31 AM

## 2023-05-25 ENCOUNTER — Other Ambulatory Visit (HOSPITAL_COMMUNITY): Payer: Self-pay

## 2023-05-25 ENCOUNTER — Other Ambulatory Visit (HOSPITAL_COMMUNITY): Payer: Self-pay | Admitting: Psychiatry

## 2023-05-25 DIAGNOSIS — A419 Sepsis, unspecified organism: Secondary | ICD-10-CM

## 2023-05-25 DIAGNOSIS — J189 Pneumonia, unspecified organism: Secondary | ICD-10-CM | POA: Diagnosis not present

## 2023-05-25 DIAGNOSIS — F3181 Bipolar II disorder: Secondary | ICD-10-CM

## 2023-05-25 DIAGNOSIS — F411 Generalized anxiety disorder: Secondary | ICD-10-CM

## 2023-05-25 LAB — BASIC METABOLIC PANEL
Anion gap: 12 (ref 5–15)
BUN: 6 mg/dL (ref 6–20)
CO2: 24 mmol/L (ref 22–32)
Calcium: 8.9 mg/dL (ref 8.9–10.3)
Chloride: 104 mmol/L (ref 98–111)
Creatinine, Ser: 0.54 mg/dL (ref 0.44–1.00)
GFR, Estimated: >60 mL/min (ref >60)
Glucose, Bld: 101 mg/dL — ABNORMAL HIGH (ref 70–99)
Potassium: 3.7 mmol/L (ref 3.5–5.1)
Sodium: 140 mmol/L (ref 135–145)

## 2023-05-25 LAB — CBC
HCT: 25.9 % — ABNORMAL LOW (ref 36.0–46.0)
Hemoglobin: 7.6 g/dL — ABNORMAL LOW (ref 12.0–15.0)
MCH: 25.5 pg — ABNORMAL LOW (ref 26.0–34.0)
MCHC: 29.3 g/dL — ABNORMAL LOW (ref 30.0–36.0)
MCV: 86.9 fL (ref 80.0–100.0)
Platelets: 519 10*3/uL — ABNORMAL HIGH (ref 150–400)
RBC: 2.98 MIL/uL — ABNORMAL LOW (ref 3.87–5.11)
RDW: 17.8 % — ABNORMAL HIGH (ref 11.5–15.5)
WBC: 10.9 10*3/uL — ABNORMAL HIGH (ref 4.0–10.5)
nRBC: 0 % (ref 0.0–0.2)

## 2023-05-25 LAB — MRSA NEXT GEN BY PCR, NASAL: MRSA by PCR Next Gen: NOT DETECTED

## 2023-05-25 LAB — MAGNESIUM: Magnesium: 1.5 mg/dL — ABNORMAL LOW (ref 1.7–2.4)

## 2023-05-25 MED ORDER — BUPRENORPHINE HCL-NALOXONE HCL 8-2 MG SL SUBL
1.0000 | SUBLINGUAL_TABLET | Freq: Three times a day (TID) | SUBLINGUAL | Status: DC
  Administered 2023-05-25 (×2): 1 via SUBLINGUAL
  Filled 2023-05-25 (×2): qty 1

## 2023-05-25 MED ORDER — GUAIFENESIN-DM 100-10 MG/5ML PO SYRP
5.0000 mL | ORAL_SOLUTION | ORAL | 0 refills | Status: DC | PRN
  Filled 2023-05-25: qty 118, 4d supply, fill #0

## 2023-05-25 MED ORDER — METHOCARBAMOL 500 MG PO TABS
1000.0000 mg | ORAL_TABLET | Freq: Three times a day (TID) | ORAL | 0 refills | Status: AC | PRN
  Filled 2023-05-25: qty 18, 3d supply, fill #0

## 2023-05-25 MED ORDER — AMOXICILLIN-POT CLAVULANATE 875-125 MG PO TABS
1.0000 | ORAL_TABLET | Freq: Two times a day (BID) | ORAL | 0 refills | Status: AC
  Filled 2023-05-25: qty 11, 6d supply, fill #0

## 2023-05-25 MED ORDER — IPRATROPIUM-ALBUTEROL 0.5-2.5 (3) MG/3ML IN SOLN: 3.0000 mL | Freq: Four times a day (QID) | RESPIRATORY_TRACT | Status: DC | PRN

## 2023-05-25 MED ORDER — FERROUS SULFATE 325 (65 FE) MG PO TABS
325.0000 mg | ORAL_TABLET | Freq: Every day | ORAL | 0 refills | Status: DC
  Filled 2023-05-25: qty 30, 30d supply, fill #0

## 2023-05-25 MED ORDER — AMOXICILLIN-POT CLAVULANATE 875-125 MG PO TABS
1.0000 | ORAL_TABLET | Freq: Two times a day (BID) | ORAL | 0 refills | Status: DC
  Filled 2023-05-25: qty 11, 6d supply, fill #0

## 2023-05-25 MED ORDER — MAGNESIUM SULFATE 4 GM/100ML IV SOLN
4.0000 g | Freq: Once | INTRAVENOUS | Status: AC
  Administered 2023-05-25: 4 g via INTRAVENOUS
  Filled 2023-05-25: qty 100

## 2023-05-25 NOTE — Discharge Summary (Signed)
 Name: Barbara Simon MRN: [id number] DOB: [birth year] 48 y.o. PCP: Monna Fam, MD  Date of Admission: 05/23/2023  6:51 PM Date of Discharge:  05/25/2023 Attending Physician: Dr. Cleda Daub  DISCHARGE DIAGNOSIS:  Primary Problem: Community acquired pneumonia   Hospital Problems: Principal Problem:   Community acquired pneumonia Active Problems:   Sepsis (HCC)   CAP (community acquired pneumonia)    DISCHARGE MEDICATIONS:   Allergies as of 05/25/2023       Reactions   Toradol [ketorolac Tromethamine] Hives, Itching   Adhesive [tape] Itching, Rash        Medication List     STOP taking these medications    hydrOXYzine 50 MG tablet Commonly known as: ATARAX   megestrol 40 MG tablet Commonly known as: MEGACE       TAKE these medications    acetaminophen 500 MG tablet Commonly known as: TYLENOL Take 500 mg by mouth every 6 (six) hours as needed for moderate pain.   albuterol 108 (90 Base) MCG/ACT inhaler Commonly known as: VENTOLIN HFA TAKE 2 PUFFS BY MOUTH EVERY 6 HOURS AS NEEDED FOR WHEEZE OR SHORTNESS OF BREATH   amoxicillin-clavulanate 875-125 MG tablet Commonly known as: AUGMENTIN Take 1 tablet by mouth 2 (two) times daily for 11 doses.   ARIPiprazole 2 MG tablet Commonly known as: Abilify Take 1 tablet (2 mg total) by mouth daily.   budesonide-formoterol 80-4.5 MCG/ACT inhaler Commonly known as: Symbicort Inhale 2 puffs into the lungs in the morning and at bedtime. Also uses as a rescue inhaler every 4 hours as needed for shortness of breath or wheezing   buprenorphine-naloxone 8-2 mg Subl SL tablet Commonly known as: SUBOXONE Place 1 tablet under the tongue 3 (three) times daily.   ferrous sulfate 325 (65 FE) MG tablet Take 1 tablet (325 mg total) by mouth daily with breakfast. Take after you finish your antibiotics Start taking on: June 01, 2023   fluticasone 50 MCG/ACT nasal spray Commonly known as: FLONASE Place 1 spray into both  nostrils daily.   gabapentin 300 MG capsule Commonly known as: NEURONTIN TAKE 3 CAPSULES (900MG  TOTAL) BY MOUTH 3 TIMES DAILY   GOODY HEADACHE PO Take by mouth.   guaiFENesin-dextromethorphan 100-10 MG/5ML syrup Commonly known as: ROBITUSSIN DM Take 5 mLs by mouth every 4 (four) hours as needed for cough.   ipratropium 0.02 % nebulizer solution Commonly known as: ATROVENT Take 2.5 mLs (0.5 mg total) by nebulization every 6 (six) hours as needed for wheezing or shortness of breath.   lamoTRIgine 100 MG tablet Commonly known as: LAMICTAL Take 1 tablet (100 mg total) by mouth daily.   naloxone 4 MG/0.1ML Liqd nasal spray kit Commonly known as: NARCAN SMARTSIG:Both Nares   pantoprazole 40 MG tablet Commonly known as: Protonix Take 1 tablet (40 mg total) by mouth daily.   polyethylene glycol powder 17 GM/SCOOP powder Commonly known as: GLYCOLAX/MIRALAX 1-4 scoops daily or as needed   venlafaxine XR 150 MG 24 hr capsule Commonly known as: EFFEXOR-XR Take 1 capsule (150 mg total) by mouth daily with breakfast.        DISPOSITION AND FOLLOW-UP:  Ms.Lillias H Shuffler was discharged from [facility] in Good condition. At the hospital follow up visit please address:  Community-acquired pneumonia: Patient discharged with a 5-day course of Augmentin and Robitussin for cough.  Acute on chronic anemia/iron deficiency anemia: Patient is discharged with oral iron (ferrous sulfate) 325 mg daily for 30 days, patient was instructed to  start after she finishes her antibiotics.  Please follow-up on her iron studies. 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  Opioid use disorder on Suboxone: discharged on home medication Suboxone 8-2  Follow-up Recommendations: Consults: None  Labs: CBC Studies: None Medications: Augmentin twice daily for 11 doses Robitussin as needed for cough.  Follow-up Appointments:  Follow-up  Information     Katheran James, DO Follow up on 06/04/2023.   Specialty: Internal Medicine Contact information: [address] [place] [address] [phone]                 HOSPITAL COURSE:  Sepsis 2/2 CAP Lactic Acidosis: Resolved  Tachycardia Chest and back pain Ms Lindahl is admitted for CAP with sepsis given progressive dyspnea, leukocytosis, lung infiltrate, and lactic acidosis. Fortunately, did not require supplemental oxygen therapy. She was treated with vancomycin and cefepime. The lactic acidosis resolved with IVF. Blood cultures were NGTD, and RVP was negative. Overall, this morning, she is doing well. Reports intermittent cough and chest pain related to coughing fits. She is afebrile and on room air.   Recent Diverticular Perforation, subsequent Ostomy Pt was hospitalized and treated in the [facility] system for diverticular perforation in early January - a 2cm perforation of the sigmoid colon with 10cm of colon resected. Severe illness from fecal contamination requiring intubation, vasopressors x 3 weeks. Thereafter treated with Zosyn/fluconazole x 6 weeks and stopped in late February given improving intra-abdominal fluid collections over multiple CT scans. Surgical site and ostomy are clean and without signs of infection. She does not report a change in the nature of her residual pain and her exam is without severe pain, tympany, rebound, guarding.   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 Continue home meds: Gabapentin 900 TID, Lamotrigine 100 every day, Venlafaxine 150 every day   DISCHARGE INSTRUCTIONS:   Discharge Instructions     Diet - low sodium heart healthy   Complete by: As directed    Discharge instructions   Complete by: As directed    You came to the hospital for pneumonia and anemia. We treated you with antibiotics. I am glad  that you are feeling improved.   For your Pneumonia -Augmentin, take 1 tablet by mouth two times daily.  When you get home in the evening, please take 1 tablet tonight and start taking twice daily from tomorrow onwards. -Robitussin DM, take 5 mL by mouth as needed for cough  -Please follow up with your PCP for symptoms   For your Iron deficiency Anemia -After you finish your antibiotics please start taking ferrous sulfate, take 1 tablet by mouth with breakfast daily for 30 days  Continue taking the rest of the medications as prescribed   If you have any of these following symptoms, please call us or seek care at an emergency department: -Chest Pain -Difficulty Breathing -Worsening abdominal pain -Syncope (passing out) -Drooping of face -Slurred speech -Sudden weakness in your leg or arm -Fever -Chills -blood in the stool -dark black, sticky stool  If you have any questions or concerns, call our clinic at [phone] or after hours call [phone] and ask for the internal medicine resident on call.  I am glad you are feeling better. It was a pleasure taking care for you. I wish a good recovery and good health!   Dr. Jeral Pinch   Increase  activity slowly   Complete by: As directed    No wound care   Complete by: As directed        SUBJECTIVE:  Patient reports her chest muscles are hurting form the coughing. She states this cough is dark brown and it is not think and looks nasty and smells bad. She states she did not want her suboxone last night as it was too late in the day. Never was on oral iron supplementation in the past, but does report that she has had infusion in the past. She reports her cough frequency has improved otherwise and denies any SOB. Reports that she is stable to go home today and plans to follow up with PCP.  Discharge Vitals:   BP 114/74 (BP Location: Right Arm)   Pulse (!) 104   Temp 99.8 F (37.7 C) (Oral)   Resp 17   Ht 5\' 3"  (1.6 m)    Wt 72.6 kg   SpO2 92%   BMI 28.35 kg/m   OBJECTIVE:  Physical Exam   General: Laying in bed, no acute distress HEENT: Normocephalic, atraumatic Cardiovascular: Regular rate, no murmur Pulmonary: Normal work of breathing, no wheezing or crackles Abdomen: Ostomy site well-dressed, abdomen is soft and nontender. Neuro: Awake, alert, no focal deficits  MSK: No lower extremity edema bilaterally  Pertinent Labs, Studies, and Procedures:     Latest Ref Rng & Units 05/25/2023    8:05 AM 05/24/2023    9:41 AM 05/24/2023   12:32 AM  CBC  WBC 4.0 - 10.5 K/uL 10.9  13.0  15.0   Hemoglobin 12.0 - 15.0 g/dL 7.6  7.6  7.0   Hematocrit 36.0 - 46.0 % 25.9  25.5  23.4   Platelets 150 - 400 K/uL 519  443  414        Latest Ref Rng & Units 05/25/2023    8:05 AM 05/24/2023   12:32 AM 05/23/2023    6:59 PM  CMP  Glucose 70 - 99 mg/dL 147  829  562   BUN 6 - 20 mg/dL 6  8  8    Creatinine 0.44 - 1.00 mg/dL 1.30  8.65  7.84   Sodium 135 - 145 mmol/L 140  131  133   Potassium 3.5 - 5.1 mmol/L 3.7  4.1  3.9   Chloride 98 - 111 mmol/L 104  99  98   CO2 22 - 32 mmol/L 24  22  24    Calcium 8.9 - 10.3 mg/dL 8.9  8.3  8.8     DG Chest 2 View Result Date: 05/23/2023 CLINICAL DATA:  Productive cough. EXAM: CHEST - 2 VIEW COMPARISON:  December 24, 2020. FINDINGS: The heart size and mediastinal contours are within normal limits. Right lower lobe airspace opacity is noted concerning for pneumonia. Left lung is clear. The visualized skeletal structures are unremarkable. IMPRESSION: Right lower lobe airspace opacity concerning for pneumonia. Followup PA and lateral chest X-ray is recommended in 3-4 weeks following trial of antibiotic therapy to ensure resolution and exclude underlying malignancy. Electronically Signed   By: Lupita Raider M.D.   On: 05/23/2023 19:20     Signed: Jeral Pinch, D.O.  Internal Medicine Resident, PGY-1 [facility]  Pager: [phone] 3:55 PM, 05/25/2023

## 2023-05-25 NOTE — Progress Notes (Signed)
   05/25/23 1333  SDOH Interventions  Food Insecurity Interventions Inpatient TOC   Resources added to AVS

## 2023-05-25 NOTE — Progress Notes (Signed)
 Went over discharge instructions with Patient & Spouse, verbalized understanding of medication orders & follow up appointments needed. Removed IV to R forearm, tolerated well. Discharged with all personal belongings to private vehicle accompanied by Spouse.

## 2023-05-25 NOTE — TOC Initial Note (Signed)
 Transition of Care (TOC) - Initial/Assessment Note   Patient from home with spouse. Patient and spouse were managing wound care and ostomy care prior to admission.   PCP is [facility].    PT no follow up or DME recommendations.     Transition of Care Department [facility]) has reviewed patient and no TOC needs have been identified at this time. We will continue to monitor patient advancement through interdisciplinary progression rounds. If new patient transition needs arise, please place a TOC consult.   Patient Details  Name: Barbara Simon MRN: [id number] Date of Birth: Dec 21, 1975  Transition of Care [facility]) CM/SW Contact:    Kingsley Plan, RN Phone Number: 05/25/2023, 3:37 PM  Clinical Narrative:                   Expected Discharge Plan: Home/Self Care Barriers to Discharge: Continued Medical Work up   Patient Goals and CMS Choice Patient states their goals for this hospitalization and ongoing recovery are:: to return home   Choice offered to / list presented to : NA      Expected Discharge Plan and Services In-house Referral: NA Discharge Planning Services: CM Consult Post Acute Care Choice: NA Living arrangements for the past 2 months: Single Family Home                 DME Arranged: N/A DME Agency: NA       HH Arranged: NA HH Agency: NA        Prior Living Arrangements/Services Living arrangements for the past 2 months: Single Family Home Lives with:: Spouse Patient language and need for interpreter reviewed:: Yes Do you feel safe going back to the place where you live?: Yes      Need for Family Participation in Patient Care: Yes (Comment) Care giver support system in place?: Yes (comment)   Criminal Activity/Legal Involvement Pertinent to Current Situation/Hospitalization: No - Comment as needed  Activities of Daily Living   ADL Screening (condition at time of admission) Independently performs ADLs?: Yes  (appropriate for developmental age) Is the patient deaf or have difficulty hearing?: No Does the patient have difficulty seeing, even when wearing glasses/contacts?: No Does the patient have difficulty concentrating, remembering, or making decisions?: No  Permission Sought/Granted   Permission granted to share information with : No              Emotional Assessment Appearance:: Appears stated age Attitude/Demeanor/Rapport: Engaged Affect (typically observed): Appropriate Orientation: : Oriented to Self, Oriented to Place, Oriented to  Time, Oriented to Situation Alcohol / Substance Use: Not Applicable Psych Involvement: No (comment)  Admission diagnosis:  Community acquired pneumonia [J18.9] Pneumonia due to infectious organism, unspecified laterality, unspecified part of lung [J18.9] Sepsis, due to unspecified organism, unspecified whether acute organ dysfunction present (HCC) [A41.9] CAP (community acquired pneumonia) [J18.9] Patient Active Problem List   Diagnosis Date Noted   CAP (community acquired pneumonia) 05/25/2023   Community acquired pneumonia 05/24/2023   Sepsis (HCC) 05/24/2023   Bipolar 2 disorder (HCC) 07/29/2022   GAD (generalized anxiety disorder) 07/29/2022   Hot flashes 07/28/2022   Irregular menses 07/28/2022   DUB (dysfunctional uterine bleeding) 07/28/2022   Elevated serum creatinine 07/28/2022   Tired 07/28/2022   Viral URI 07/22/2022   Rectocele, Grade 3 04/28/2022   Perimenopause 04/28/2022   Vomiting 04/14/2022   Left lumbar radiculopathy 02/22/2022   Eczema 09/19/2021   Swelling of lower extremity 09/19/2021  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/03/2021   Posterior vaginal wall prolapse 06/14/2021   Healthcare maintenance 05/13/2021   History of colonic polyps 2019 01/08/2021   Asthma 12/24/2020   Tobacco use 11/20/2020   Nasal polyp 11/20/2020   Stress at home 09/25/2020   Bipolar II  disorder (HCC) 07/24/2020   Peripheral neuropathy 07/24/2020   Carpal tunnel syndrome, bilateral 07/24/2020   Opioid use disorder 12/20/2015   Cervical high risk HPV (human papillomavirus) test positive 03/29/2013   History of hepatitis C 02/15/2013   Major depression 02/03/2013   PCP:  Monna Fam, MD Pharmacy:   [facility] [phone] - [place], [place] - [address] AT [facility] [address] [place] [address] Phone: [phone] Fax: [phone]  [facility] [address], [place] - [address] [place] [address] [place] [address] Phone: [phone] Fax: [phone]  [facility] [address] [place] [address] Phone: [phone] Fax: [phone]     Social Drivers of Health (SDOH) Social History: SDOH Screenings   Food Insecurity: Food Insecurity Present (05/24/2023)  Housing: Low Risk  (05/24/2023)  Transportation Needs: No Transportation Needs (05/24/2023)  Utilities: Not At Risk (05/24/2023)  Alcohol Screen: Low Risk  (07/16/2021)  Depression (PHQ2-9): High Risk (08/22/2022)  Financial Resource Strain: Medium Risk (05/16/2023)   Received from [facility]  Physical Activity: Insufficiently Active (07/16/2021)  Social Connections: Socially Integrated (07/16/2021)  Stress: Stress Concern Present (07/16/2021)  Tobacco Use: Medium Risk (05/23/2023)   SDOH Interventions: Food Insecurity Interventions: Inpatient TOC   Readmission Risk Interventions     No data to display

## 2023-05-25 NOTE — Discharge Instructions (Addendum)
 [facility] assistance programs. If you are behind on your bills and expenses, and need some help to make it through a short term hardship or financial emergency, there are several organizations and charities in the [place] area that may be able to help. They range from the Pathmark Stores, Liberty Global, Landscape architect of Weyerhaeuser Company and the local community action agency, the Intel, Avnet. These groups may be able to provide you resources to help pay your utility bills, rent, and they even offer housing assistance.  Crisis assistance program Find help for paying your rent, electric bills, free food, and even funds to pay your mortgage. The Liberty Global ([phone] [phone]) offers several services to local families, as funding allows. The Emergency Assistance Program (EAP), which they administer, provides household goods, free food, clothing, and financial aid to people in need in the [facility] area. The EAP program does have some qualification, and counselors will interview clients for financial assistance by written referral only. Referrals need to be made by the Department of Social Services or by other EAP approved human services agencies or charities in the area.  Money for resources for emergency assistance are available for security deposits for rent, water, electric, and gas, past due rent, utility bills, past due mortgage payments, food, and clothing. The Liberty Global also operates a Programme researcher, broadcasting/film/video on the site. More Liberty Global.  Open Door Ministries of [employer], which can be reached at [phone], offers emergency assistance programs for those in need of help, such as food, rent assistance, a soup kitchen, shelter, and clothing. They are based in [facility] but provide a number of services to those that qualify for assistance. Continue with Open Door Ministries  programs.  [facility] Department of Social Services may be able to offer temporary financial assistance and cash grants for paying rent and utilities. Help may be provided for local county residents who may be experiencing personal crisis when other resources, including government programs, are not available. Call [phone]  [place]. [place] Society, which is based in [place], provides financial assistance of up to $50.00 to help pay for rent, utilities, cooling bills, rent, and prescription medications. The program also provides secondhand furniture to those in need. [phone]  Mattel is a Geneticist, molecular. The organization can offer emergency assistance for paying rent, electric bills, utilities, food, household products and furniture. They offer extensive emergency and transitional housing for families, children and single women, and also run a Boy's and Dole Food. 301 Thrift Shops, CMS Energy Corporation, and other aid offered too. [address], [place], [place] [address], [phone]  Additional locations of the [employer] are in [place] and other nearby communities. When you have an emergency, need free food, money for basic needs, or just need assistance around Christmas, then the Pathmark Stores may have the resources you need. Or they can refer you to nearby agencies. Learn more.  [place] Low Income Risk manager - This is offered for [facility] families. The federal government created CIT Group Program provides a one-time cash grant payment to help eligible low-income families pay their electric and heating bills. [address], [place], [place] [address], [phone]  Government and [name] - The county administers several emergency and self-sufficiency programs. Residents of [place] can get help with energy bills and food, rent, and  other expenses. In addition,  work with a Sports coach who may be able to help you find a job or improve your employment skills. More [place] public assistance.  [place] Emergency Assistance - A program offers emergency utility and rent funds for greater [employer] area residents. The program can also provide counseling and referrals to charities and government programs. Also provides food and a free meal program that serves lunch Mondays - Saturdays and dinner seven days per week to individuals in the community. [address], [place], [place] [address], [phone]  P[employer] - Offers affordable apartment and housing communities across [place] and [place]. The low income and seniors can access public housing, rental assistance to qualified applicants, and apply for the section 8 rent subsidy program. Other programs include Chiropractor and Engineer, maintenance. [address], [place], [place] [address], dial [phone].  Basic needs such as clothing - Low income families can receive free items (school supplies, clothes, holiday assistance, etc.) from clothing closets while more moderate income [address] families can shop at [employer]. Locations across the area help the needy. Get information on [place] Tri[place] free clothing centers.  The [facility] provides transitional housing to veterans and the disabled. Clients will also access other services too, including life skills classes, case management, and assistance in finding permanent housing. [address], [place], [place] [address], call [phone]  [facility] Transitional Housing in [place] is for people who were just evicted or that are formerly homeless. The non-profit will also help then gain self-sufficiency, find a home or apartment to live in, and also provides information on rent assistance when needed. Dial [phone]  [employer] Partnership to End Homelessness is available in [place]. Families that were evicted or that are homeless can gain shelter, food, clothing, furniture, and also emergency financial assistance. Other services include financial skills and life skills coaching, job training, and case management. [address] [place], [place] [address]. Telephone [phone].  The Dynegy, Avnet. runs the [employer]. This can help people save money on their heating and summer cooling bills, and is free to low income families. Free upgrades can be made to your home. Phone [phone]  Many of the non-profits and programs mentioned above are all inclusive, meaning they can meet many needs of the low income, such as energy bills, food, rent, and more. However there are several organizations that focus just on rent and housing. Read more on rent assistance in [place] region.  Legal assistance for evictions, foreclosure, and more If you need free legal advice on civili issues, such as foreclosures, evictions, Electronics engineer, government programs, domestic issues and more, Armed forces operational officer Aid of [place] [facility]) is a Associate Professor firm that provides free legal services and counsel to lower income people, seniors, disabled, and others. The goal is to ensure everyone has access to justice and fair representation.  Call them at [phone], or click here to learn more about [place] free legal assistance programs.  [place] Avnet and funds for emergency expenses The Pathmark Stores is another organization that can provide people with Deere & Company and funds to pay bills. Their assistance depends on funding, and the demand for help is always very high. They can provide cash to help pay rent, a missed mortgage payment, or gas, electric, and water bills. But the assistance doesn't stop there. They also have a food pantry  on site, which can provide  food once every three (3) months to people who need help. The KeyCorp can also offer a Engineering geologist once every three (3) months for a maximum three (3) times. After receiving this voucher over that period of time, applicants can receive this aid one every six (6) months after that. [phone].  Kohl's action agency The Intel, Avnet. offers job and Dispensing optician. Resources are focused on helping students obtain the skills and experiences that are necessary to compete in today's challenging and tight job market. The non-profit faith-based community action agency offers internship trainings as well as classroom instruction. Economically disadvantaged and challenged individuals and potential employers can use their services. Classes are tailored to meet the needs of people in the [facility] region. [place], [place] [address], [phone]    Foreclosure prevention services Housing Counseling and Education is also offered by [employer] of the [place]. The agency (phone number is below) is a Engineer, structural providing foreclosure advice and counseling. They offer mortgage resolution counseling and also reverse mortgage counseling. Counselors can direct people to both Kimberly-Clark, as well as Weyerhaeuser Company foreclosure assistance options.  Warehouse manager has locations in [place] and [employer]. They run debt and foreclosure prevention programs for local families. A sampling of the programs offered include both Budget and Housing Counseling. This includes money management, financial advice, budget review and development of a written action plan with a Pensions consultant to help solve specific individual financial problems. In addition, housing and mortgage counselors can also provide pre- and post-purchase  homeownership counseling, default resolution counseling (to prevent foreclosure) and reverse mortgage counseling. A Debt Management Program allows people and families with a high level of credit card or medical debt to consolidate and repay consumer debt and loans to creditors and rebuild positive credit ratings and scores. [phone]  Debt assistance programs Receive free counseling and debt help from [facility] of the [place]. The [facility] based agency can be reached at [phone]. The counselors provide free help, and the services include budget counseling. This will help people manage their expenses and set goals. They also offer a Forensic scientist, which will help individuals consolidate their debts and become debt free. Most of the workshops and services are free.  Community clinics in [place] of the leading health and dental centers are listed below. They may be able to provide medication, physicals, dental care, and general family care to residents of all incomes and backgrounds across the region. Some of the programs focus on the low income and underinsured. However if these clinics can't meet your needs, find information and details on more clinics in [facility].  Some of the options include Marriott of [employer]. This center provides free or low cost health care to low-income adults 18 - 64, who have no health insurance. Among other services offered include a pharmacy and eye clinic. Phone [phone]  [facility], which is located in [place], is a community clinic that provides primary medical and health care to uninsured and underinsured adults and families, as well as the low income, in the greater [place] area on a sliding-fee scale. Call [phone]  [place] Adult Dental Program - They run a dental assistance program that is organized by [facility] to provide dental services  and aid  to [employer]. Services offered by the dental clinic are limited to extractions, pain management, and minor restorative care. [phone]  [place] [facility] has locations in [facility] and [place]. The community clinics provide complete pediatric care including primary health, mental health, social work, neurology, cardiology, asthma. Dial [phone].  In addition to those [address] and Safeway Inc, find other free community clinics in [place] and across the county.  Food pantry and assistance Some of the local food pantries and distribution centers to call for free food and groceries include The Hive of [place] (phone [phone]), The [facility] (phone [phone]) and also PPL Corporation. Dial [phone].  Several other food banks in the region provide clothing, free food and meals, access to soup kitchens and other help. Find the addresses and phone numbers of more food pantries in [place]. http://www.needhelppayingbills.com/html/[place]_assistance_pro.html  SUNDAYS BREAKFAST TWO LOCATIONS: 8:00am served in Nucor Corporation by Awaken PPL Corporation 8:30am SHUTTLE provided from [facility], served at Apache Corporation, [address] LUNCH TWO LOCATIONS [plus one additional third Sunday only] 10:30am - 12:30pm served at Ecolab, Liberty Global, [place] [address] (1.2 miles from [facility]) 12:30pm served in [place] by Land O'Lakes Team (THIRD Sunday only) 1:30pm served at [facility] by [facility] one location [plus one additional third Sunday only] 5:00pm Every Sunday, served under the bridge at [address] by Lindell Noe Under the 3M Company (.7 miles from [facility]) (THIRD Sunday ONLY) 4:00pm served in the parking garage, across from Nucor Corporation, corner of [place] and [place] by Ryland Group Works  Ministries MONDAYS BREAKFAST 7:30am served in Nucor Corporation by the United States Steel Corporation and Friends LUNCH 10:30am - 12:30pm served at Ecolab, Liberty Global, [place] [address] (1.2 miles from [facility]) DINNER TWO LOCATIONS: 7:00pm served in front of the courthouse at the corner of Goldman Sachs and International Business Machines. by [facility] Monday Night Meal (3 blocks from [facility]) 4:30pm served at the AutoNation, [address] by The Procter & Gamble Not Bombs (0.6 miles from [facility]) [place] BREAKFAST 8:00am - 9:00am served at The TJX Companies, [address] (0.3 miles from [place]) LUNCH 10:30am - 12:30pm served at the Ecolab, Liberty Global [address], (1.2 miles from [place]) DINNER 6:00pm served at CSX Corporation, enter from Capital One and go to the Sonic Automotive, (0.7 miles from [facility]) [facility] BREAKFAST 7:00am - 8:00am served at Ecolab, Liberty Global [address], (1.2 miles from [place]) LUNCH ONE LOCATION [plus two additional locations listed below] 10:30am - 12:30pm served at Ecolab, Liberty Global [address], (1.2 miles from [place]) (FIRST Wednesday ONLY) 11:30am served at Dillard's, [place] [address] (6.6 miles from [place]) (SECOND Wednesday ONLY) 11:00am served at [place]. Melvyn Novas of [address], [address] (1.3 miles from [place]) [place] TWO LOCATIONS 6:00pm served at W. R. Berkley, [place] [address]. Visteon Corporation. (1.3 miles from [facility]) 4:00pm - 6:00pm (hot dogs and chips) served at Levi Strauss of [facility], [address] (1.7 miles from [place]) [place] BREAKFAST NOT AVAILABLE AT THIS TIME LUNCH 10:30am - 12:30pm served at Ecolab, [place]  AT&T,  [phone] [address] (1.2 miles from [place]) DINNER 6:00pm served at CSX Corporation, enter from Capital One and go to the Sonic Automotive, (0.7 miles from Nucor Corporation) [place] BREAKFAST NOT AVAILABLE AT THIS TIME LUNCH 10:30am - 12:30pm served at Ecolab, Liberty Global [address] (1.2 miles from [place]) DINNER TWO LOCATIONS, [plus one additional first Friday only] 6:00pm served under the bridge at [address] by Lindell Noe Under CSX Corporation. (.7 miles from [facility]) 5:00pm - 7:00pm served at Levi Strauss of [facility], [address] (1.7 miles from [place]) (FIRST Friday ONLY) 5:45 pm - SHUTTLE provided from the LIBRARY at 5:45pm. Served at [facility], [address]. SATURDAYS BREAKFAST TWO LOCATIONS [plus one additional last Saturday only] 8:00am served at [facility] by Delphi 8:30am served at Pulte Homes, [address]. (2.2 miles from [facility]) (LAST Saturday ONLY) 8:30am served at Beazer Homes, [address] (5 miles from [place]) LUNCH 10:30am - 12:30pm served at Ecolab, Liberty Global [address] (1.2 miles from [facility]) DINNER 6:00pm served under the bridge at [address] by World Fuel Services Corporation (0.7 miles from Nucor Corporation)  DIRECTIONS FROM [place] TO ALL MEAL LOCATIONS The Bridge at [address] (.7 miles from [address]) [address] on [place]. Turn Right onto DIRECTV 433 ft. Continue onto [address] under bridge, about 500 ft. Courthouse (3 blocks from [facility]) [place] on [address]. Turn right on [place] 1 block to PPL Corporation (.5 miles from [place]) [place] on [place]. YRC Worldwide. past Brink's Company to EMCOR. Enter from Capital One and  go to the Affiliated Computer Services building W. R. Berkley [address] Visteon [address]. (1.3 miles from [facility]) [address] on [place]. Turn Right onto [address]. church will be on the Left. The TJX Companies [address] (.3 miles from [facility]) Go .3 miles on W. Friend[address] Destination is on your right Dillard's at ONEOK (6.6 miles from [address]) [address] on [place] toward [address] Turn right onto [address] Continue onto Alcoa Inc. Continue onto Toll Brothers. 5. Elesa Hacker is on right [facility] Conseco) [address] (.6 miles from [address]) [place] on [place]. [address] Turn Left onto [address] 0.3 miles Destination is on the Left. [place] Black & Decker at American Express (5 miles from Nucor Corporation) 1. Head south on [address]. Turn right onto [address] Turn slightly left onto Quest Diagnostics Continue onto Quest Diagnostics Turn right at Barnes & Noble Continue to church on right New Birth Sounds of [facility] [address] (1.7 miles from [place]) [address] on [place] 1.4 miles [place] becomes [place]. [address] Continue 0.6 miles and church will be on theright. [place] Guardian Life Insurance at [address] (2.5 miles from Nucor Corporation) [place] provided from Massachusetts Mutual Life Park] [place] on [place]. Elm toward Estée Lauder right onto Costco Wholesale left onto Henry Schein left onto Micron Technology [address] Southern Company (  2.2 miles from [facility]) [address] on [place] 1.4 miles [place] becomes [place]. Elm [address] Turn right onto [address]. and church will be on the Left. Potter's House/ AT&T [address] (1.2 miles from [facility]) 1.Turn right onto [facility] 2.Turn left onto Rogue Jury 3.Reino Kent 4.Destination is on your right [place]. Melvyn Novas of [address] at General Mills (1.3 miles from [facility]) [address] on [address] Turn left onto Genuine Parts right onto [address]. [place]. Continue onto [name]	[name]. Turn left onto Smurfit-Stone Container. Turn right onto WellPoint    Ms. Creveling,  You came to the hospital for pneumonia and anemia. We treated you with antibiotics. I am glad that you are feeling improved.   For your Pneumonia -Augmentin, take 1 tablet by mouth two times daily.  When you get home in the evening, please take 1 tablet tonight and start taking twice daily from tomorrow onwards. -Robitussin DM, take 5 mL by mouth as needed for cough  -Please follow up with your PCP for symptoms   For your Iron deficiency Anemia -After you finish your antibiotics please start taking ferrous sulfate, take 1 tablet by mouth with breakfast daily for 30 days  Continue taking the rest of the medications as prescribed   If you have any of these following symptoms, please call us or seek care at an emergency department: -Chest Pain -Difficulty Breathing -Worsening abdominal pain -Syncope (passing out) -Drooping of face -Slurred speech -Sudden weakness in your leg or arm -Fever -Chills -blood in the stool -dark black, sticky stool  If you have any questions or concerns, call our clinic at [phone] or after hours call [phone] and ask for the internal medicine resident on call.  I am glad you are feeling better. It was a pleasure taking care for you. I wish a good recovery and good health!   Dr. Jeral Pinch

## 2023-05-25 NOTE — Plan of Care (Signed)
  Problem: Education: Goal: Knowledge of General Education information will improve Description: Including pain rating scale, medication(s)/side effects and non-pharmacologic comfort measures Outcome: Progressing   Problem: Clinical Measurements: Goal: Respiratory complications will improve Outcome: Progressing   Problem: Pain Managment: Goal: General experience of comfort will improve and/or be controlled Outcome: Progressing   Problem: Safety: Goal: Ability to remain free from injury will improve Outcome: Progressing

## 2023-05-25 NOTE — Progress Notes (Signed)
 OT Cancellation Note  Patient Details Name: Barbara Simon MRN: [id number] DOB: August 16, 1975   Cancelled Treatment:    Reason Eval/Treat Not Completed: OT screened, no needs identified, will sign off (Discussed with PT). Pt stated no concerns for home dc. Adequate family support for ADLs and IADLs.  Ivor Messier, OT  Acute Rehabilitation Services Office [phone] Secure chat preferred   Barbara Simon 05/25/2023, 2:44 PM

## 2023-05-25 NOTE — Evaluation (Signed)
 Physical Therapy Evaluation Patient Details Name: Barbara Simon MRN: [id number] DOB: Nov 24, 1975 Today's Date: 05/25/2023  History of Present Illness  48 y.o. female presents to [facility] hospital on 05/23/2023 with cough and DOE, admitted for CAP. PMH includes perforated diverticulitis s.p ostomy, bipolar disorder, asthma, OUD on suboxone, non-epileptic seizures.  Clinical Impression  Pt presents to PT not far from her recent baseline. Pt is able to ambulate independently within the hospital room. Pt reports having difficulty managing stairs since her last hospital admission with creation of ostomy, however she has assistance from her spouse to manage these when necessary. Pt reports no further need for PT services at this time. Acute PT signing off, encourages frequent mobility for the remainder of this admission.        If plan is discharge home, recommend the following: Help with stairs or ramp for entrance   Can travel by private vehicle        Equipment Recommendations None recommended by PT  Recommendations for Other Services       Functional Status Assessment Patient has not had a recent decline in their functional status     Precautions / Restrictions Precautions Precautions: None Recall of Precautions/Restrictions: Intact Restrictions Weight Bearing Restrictions Per Provider Order: No      Mobility  Bed Mobility Overal bed mobility: Independent                  Transfers Overall transfer level: Independent                      Ambulation/Gait Ambulation/Gait assistance: Modified independent (Device/Increase time) Gait Distance (Feet): 100 Feet Assistive device: None Gait Pattern/deviations: WFL(Within Functional Limits) Gait velocity: functional Gait velocity interpretation: >2.62 ft/sec, indicative of community ambulatory   General Gait Details: steady step-through gait  Stairs            Wheelchair Mobility     Tilt Bed    Modified  Rankin (Stroke Patients Only)       Balance Overall balance assessment: Independent                                           Pertinent Vitals/Pain Pain Assessment Pain Assessment: No/denies pain    Home Living Family/patient expects to be discharged to:: Private residence Living Arrangements: Spouse/significant other Available Help at Discharge: Family;Available PRN/intermittently Type of Home: House Home Access: Stairs to enter Entrance Stairs-Rails: None Entrance Stairs-Number of Steps: 4   Home Layout: Two level;Able to live on main level with bedroom/bathroom (pt has been living on the main level since last admission) Home Equipment: Rollator (4 wheels);Shower seat      Prior Function Prior Level of Function : Needs assist             Mobility Comments: ambulates within the home without DME, utilizes a rollator for community distances. assistance for stair negotiation since last discharge.       Extremity/Trunk Assessment   Upper Extremity Assessment Upper Extremity Assessment: Overall WFL for tasks assessed    Lower Extremity Assessment Lower Extremity Assessment: Overall WFL for tasks assessed    Cervical / Trunk Assessment Cervical / Trunk Assessment: Normal  Communication   Communication Communication: No apparent difficulties    Cognition Arousal: Alert Behavior During Therapy: Agitated (mild agitation about PT consult)   PT - Cognitive impairments: No apparent impairments  Following commands: Intact       Cueing Cueing Techniques: Verbal cues     General Comments General comments (skin integrity, edema, etc.): VSS on RA, SpO2 94%    Exercises     Assessment/Plan    PT Assessment Patient does not need any further PT services  PT Problem List         PT Treatment Interventions      PT Goals (Current goals can be found in the Care Plan section)       Frequency        Co-evaluation               AM-PAC PT "6 Clicks" Mobility  Outcome Measure Help needed turning from your back to your side while in a flat bed without using bedrails?: None Help needed moving from lying on your back to sitting on the side of a flat bed without using bedrails?: None Help needed moving to and from a bed to a chair (including a wheelchair)?: None Help needed standing up from a chair using your arms (e.g., wheelchair or bedside chair)?: None Help needed to walk in hospital room?: None Help needed climbing 3-5 steps with a railing? : A Little 6 Click Score: 23    End of Session   Activity Tolerance: Patient tolerated treatment well Patient left: in bed;with call bell/phone within reach Nurse Communication: Mobility status PT Visit Diagnosis: Other abnormalities of gait and mobility (R26.89)    Time: 4098-1191 PT Time Calculation (min) (ACUTE ONLY): 8 min   Charges:   PT Evaluation $PT Eval Low Complexity: 1 Low   PT General Charges $$ ACUTE PT VISIT: 1 Visit         Arlyss Gandy, PT, DPT Acute Rehabilitation Office [phone]   Arlyss Gandy 05/25/2023, 2:54 PM

## 2023-05-28 DIAGNOSIS — Z09 Encounter for follow-up examination after completed treatment for conditions other than malignant neoplasm: Secondary | ICD-10-CM | POA: Diagnosis not present

## 2023-05-28 DIAGNOSIS — K631 Perforation of intestine (nontraumatic): Secondary | ICD-10-CM | POA: Diagnosis not present

## 2023-05-29 ENCOUNTER — Other Ambulatory Visit (HOSPITAL_COMMUNITY): Payer: Self-pay | Admitting: Psychiatry

## 2023-05-29 DIAGNOSIS — F411 Generalized anxiety disorder: Secondary | ICD-10-CM

## 2023-05-29 DIAGNOSIS — F3181 Bipolar II disorder: Secondary | ICD-10-CM

## 2023-05-29 LAB — CULTURE, BLOOD (SINGLE): Culture: NO GROWTH

## 2023-06-04 ENCOUNTER — Ambulatory Visit: Payer: Medicaid Other | Admitting: Student

## 2023-06-04 DIAGNOSIS — F1191 Opioid use, unspecified, in remission: Secondary | ICD-10-CM

## 2023-06-04 NOTE — Progress Notes (Signed)
 I attempted to contact Barbara Simon and her listed alternate contact multiple times each for a scheduled phone visit. Unfortunately there was no answer and no working Engineer, technical sales.

## 2023-06-08 ENCOUNTER — Other Ambulatory Visit: Payer: Self-pay | Admitting: Internal Medicine

## 2023-06-08 DIAGNOSIS — J45909 Unspecified asthma, uncomplicated: Secondary | ICD-10-CM | POA: Diagnosis not present

## 2023-06-08 DIAGNOSIS — G6289 Other specified polyneuropathies: Secondary | ICD-10-CM

## 2023-06-08 DIAGNOSIS — G935 Compression of brain: Secondary | ICD-10-CM | POA: Diagnosis not present

## 2023-06-08 DIAGNOSIS — K631 Perforation of intestine (nontraumatic): Secondary | ICD-10-CM | POA: Diagnosis not present

## 2023-06-08 DIAGNOSIS — G629 Polyneuropathy, unspecified: Secondary | ICD-10-CM | POA: Diagnosis not present

## 2023-06-08 DIAGNOSIS — F1190 Opioid use, unspecified, uncomplicated: Secondary | ICD-10-CM

## 2023-06-08 NOTE — Telephone Encounter (Signed)
 Copied from CRM [id number]. Topic: Clinical - Medication Refill >> Jun 08, 2023  3:25 PM Corin V wrote: Most Recent Primary Care Visit:  Provider: Katheran James  Department: [facility]  Visit Type: TELEPHONE OFFICE VISIT  Date: 06/04/2023  Medication: gabapentin (NEURONTIN) 300 MG capsule buprenorphine-naloxone (SUBOXONE) 8-2 mg SUBL SL tablet  Has the patient contacted their pharmacy? Yes- per pharmacist no refills available (Agent: If no, request that the patient contact the pharmacy for the refill. If patient does not wish to contact the pharmacy document the reason why and proceed with request.) (Agent: If yes, when and what did the pharmacy advise?)  Is this the correct pharmacy for this prescription? Yes If no, delete pharmacy and type the correct one.  This is the patient's preferred pharmacy:  [id number] - [place], [place] - [address] [place] [address] Phone: [phone] Fax: [phone]  Has the prescription been filled recently? No  Is the patient out of the medication? Yes  Has the patient been seen for an appointment in the last year OR does the patient have an upcoming appointment? Yes  Can we respond through MyChart? No  Agent: Please be advised that Rx refills may take up to 3 business days. We ask that you follow-up with your pharmacy.

## 2023-06-09 NOTE — Telephone Encounter (Signed)
 LOV 06/04/23.

## 2023-06-15 NOTE — Progress Notes (Unsigned)
 BH MD/PA/NP OP Progress Note  06/15/2023 2:57 PM Barbara Simon  MRN:  [id number]  Visit Diagnosis:  No diagnosis found.  Assessment:  Barbara Simon is a 48 y.o. female with a history of bipolar disorder 2 disorder, opioid use disorder on suboxone maintenance, psychogenic seizures, GAD, and asthma who presented to [facility] for initial evaluation on 07/29/2022.  During initial evaluation patient reported symptoms of insomnia, irritability, mood lability, dysphoria, fatigue, weight loss, amotivation, and passive SI without intent or plan.  Safety planning was reviewed and patient is aware of crisis resources.  She does have a past history of hypomania including decreased sleep and increased energy outside of the context of substance use.  Patient does have a significant past history of substance use including cocaine, opiates, marijuana and benzodiazepines.  She denies any cocaine use and opiate use disorder is stable on Suboxone.  Patient does use marijuana regularly and benzodiazepines infrequently.  Education was provided and patient was recommended to discontinue the benzodiazepine and marijuana use.  Patient also is experiencing seizure-like symptoms including tremors, rigidity, disorientation, and intermittent loss of consciousness without associated tongue biting or loss of bowel/bladder control.  Patient meets criteria for bipolar 2 disorder and generalized anxiety disorder.  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    Barbara Simon presents for follow-up evaluation. Today, 06/15/23, patient reports   some initial improvement in her depression after the initiation of Effexor. The only notable side effect was some oral dryness which was manageable. There had been a recent stressor which led to an increase in her anxiety and depressive symptoms. Support was provided around this. We will  titrate venlafaxine to 112.5 mg for 3 days before increasing to 150 mg daily. Risks and benefits were reviewed. Will continue remainder of her current regimen at this time. Can consider restarting trazodone in the future if finances improve.  Plan: - Continue Lamictal 100 mg daily for bipolar disorder - Increase Venlafaxine 112.5 mg daily for 3 days before increasing to 150 mg daily - Continue Abilify 2 mg QD for bipolar disorder - Can restart trazodone 25 mg QHS in the future if finances improve - Continue Suboxone 8-2mg  films TID managed by PCP, patient only taking 1 film a day currently, could consider Sublocade - CMP, CBC, alkaline phosphatase, TSH, T4 reviewed - Continue to follow with neurology - Therapy referral - Crisis resources reviewed - Follow up in a month  Chief Complaint:  No chief complaint on file.  HPI: Barbara Simon presents reporting that    the medicine has been working a bit and she felt some improvement in her mental state, but her energy is still low. The only adverse side effect she noted was some mild dryness in her mouth. Overall she finds this manageable and we discussed the importance of drinking more water to help manage this. Sleep has been poor though it improves when she takes trazodone. Patient is unable to afford a refill on this for now but will reconsider if her financial situation improves in the future.   Barbara Simon has endorsed a recent increase in her stress after her neighbor shot her sisters dog. Patient was very close to this dog and her own dog was out there as well which scared Barbara Simon. She has started to have seizures again after the event. Support was provided. Patient does feel that things will start to improve as she gets some closure  and more time from the event.   Discussed titrating the Effexor today which patient was open to. Risks and benefits of the increase were reviewed.   Past Psychiatric History: Information collected from pt, medical  record Prior hx "bipolar II". Has had hypomanic but not manic episodes outside of periods of substance abuse.  Patient's manic episodes appear to only occurred in context of substance use.  Patient has 2 prior psychiatric hospitalizations in 2005 and 2010 to [facility].  Has tried Lamictal, Depakote, Seroquel, Abilify, Celexa, Lexapro, Cymbalta, Remeron, trazodone, Xanax  Sober on buprenorphine from opiates. Uses marijuana, home grown. She takes a couple Xanax from her mom when she is overwhelmed. Counseling was provided in regards to both. She denies   Past Medical History:  Past Medical History:  Diagnosis Date   Abnormal Pap smear    LSIL   Anxiety    Asthma    Bipolar 1 disorder (HCC)    Depression    Hepatitis C    two years ago was dx   Ovarian cyst    Seizure (HCC)    Thyrotoxicosis     Past Surgical History:  Procedure Laterality Date   ABDOMINAL SURGERY     APPENDECTOMY     TUBAL LIGATION      Family History:  Family History  Problem Relation Age of Onset   Cancer Maternal Grandmother    Cancer Mother        bladder   Diabetes Mother    Diabetes Brother    Diabetes Sister    Cerebral palsy Daughter    Cancer Maternal Aunt     Social History:  Social History   Socioeconomic History   Marital status: Married    Spouse name: Not on file   Number of children: Not on file   Years of education: Not on file   Highest education level: Not on file  Occupational History   Not on file  Tobacco Use   Smoking status: Former    Current packs/day: 0.50    Types: Cigarettes   Smokeless tobacco: Never   Tobacco comments:    1 pack lasts 2 days   Vaping Use   Vaping status: Never Used  Substance and Sexual Activity   Alcohol use: No   Drug use: Yes    Types: Marijuana    Comment: Occasionally.   Sexual activity: Not Currently    Birth control/protection: Surgical    Comment: tubal  Other Topics Concern   Not on file  Social History Narrative   Are you right  handed or left handed? right   Are you currently employed ? 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 no   Who lives with you?  family   What type of home do you live in: 1 story or 2 story?  2 story lives up stairs 15-20 steps       Social Drivers of Health   Financial Resource Strain: Medium Risk (05/16/2023)   [facility]   Overall Financial Resource Strain (CARDIA)    Difficulty of Paying Living Expenses: Somewhat hard  Food Insecurity: Food Insecurity Present (05/24/2023)   Hunger Vital Sign    Worried About Running Out of Food in the Last Year: Sometimes true    Ran Out of Food in the Last Year: Often true  Transportation Needs: No Transportation Needs (05/24/2023)   PRAPARE - Transportation    Lack  of Transportation (Medical): No    Lack of Transportation (Non-Medical): No  Physical Activity: Insufficiently Active (07/16/2021)   Exercise Vital Sign    Days of Exercise per Week: 2 days    Minutes of Exercise per Session: 20 min  Stress: Stress Concern Present (07/16/2021)   [employer] of Occupational Health - Occupational Stress Questionnaire    Feeling of Stress : Very much  Social Connections: Socially Integrated (07/16/2021)   Social Connection and Isolation Panel [NHANES]    Frequency of Communication with Friends and Family: More than three times a week    Frequency of Social Gatherings with Friends and Family: More than three times a week    Attends Religious Services: More than 4 times per year    Active Member of [employer] or Organizations: Yes    Attends Banker Meetings: More than 4 times per year    Marital Status: Living with partner    Allergies:  Allergies  Allergen Reactions   Toradol [Ketorolac Tromethamine] Hives and Itching   Adhesive [Tape] Itching and Rash    Current Medications: Current Outpatient Medications  Medication Sig Dispense Refill   acetaminophen (TYLENOL) 500 MG tablet Take  500 mg by mouth every 6 (six) hours as needed for moderate pain.     albuterol (VENTOLIN HFA) 108 (90 Base) MCG/ACT inhaler TAKE 2 PUFFS BY MOUTH EVERY 6 HOURS AS NEEDED FOR WHEEZE OR SHORTNESS OF BREATH 18 each 2   ARIPiprazole (ABILIFY) 2 MG tablet Take 1 tablet (2 mg total) by mouth daily. 90 tablet 0   Aspirin-Acetaminophen-Caffeine (GOODY HEADACHE PO) Take by mouth.     budesonide-formoterol (SYMBICORT) 80-4.5 MCG/ACT inhaler Inhale 2 puffs into the lungs in the morning and at bedtime. Also uses as a rescue inhaler every 4 hours as needed for shortness of breath or wheezing 1 each 12   buprenorphine-naloxone (SUBOXONE) 8-2 mg SUBL SL tablet Place 1 tablet under the tongue 3 (three) times daily. 90 tablet 2   ferrous sulfate 325 (65 FE) MG tablet Take 1 tablet (325 mg total) by mouth daily with breakfast. Take after you finish your antibiotics 30 tablet 0   fluticasone (FLONASE) 50 MCG/ACT nasal spray Place 1 spray into both nostrils daily. 9.9 mL 1   gabapentin (NEURONTIN) 300 MG capsule TAKE 3 CAPSULES (900MG  TOTAL) BY MOUTH 3 TIMES DAILY 180 capsule 5   guaiFENesin-dextromethorphan (ROBITUSSIN DM) 100-10 MG/5ML syrup Take 5 mLs by mouth every 4 (four) hours as needed for cough. 118 mL 0   ipratropium (ATROVENT) 0.02 % nebulizer solution Take 2.5 mLs (0.5 mg total) by nebulization every 6 (six) hours as needed for wheezing or shortness of breath. 125 mL 3   lamoTRIgine (LAMICTAL) 100 MG tablet Take 1 tablet (100 mg total) by mouth daily. 90 tablet 0   naloxone (NARCAN) nasal spray 4 mg/0.1 mL SMARTSIG:Both Nares     pantoprazole (PROTONIX) 40 MG tablet Take 1 tablet (40 mg total) by mouth daily. 90 tablet 3   polyethylene glycol powder (GLYCOLAX/MIRALAX) 17 GM/SCOOP powder 1-4 scoops daily or as needed 255 g 11   venlafaxine XR (EFFEXOR-XR) 150 MG 24 hr capsule Take 1 capsule (150 mg total) by mouth daily with breakfast. 90 capsule 0   No current facility-administered medications for this  visit.     Psychiatric Specialty Exam: Review of Systems  There were no vitals taken for this visit.There is no height or weight on file to calculate BMI.  General Appearance: Casual  Eye Contact:  Fair  Speech:  Normal Rate  Volume:  Normal  Mood:  Depressed  Affect:  Appropriate and Congruent  Thought Process:  Coherent  Orientation:  Full (Time, Place, and Person)  Thought Content: Logical   Suicidal Thoughts:  No  Homicidal Thoughts:  No  Memory:  Immediate;   Fair  Judgement:  Fair  Insight:  Fair  Psychomotor Activity:  Decreased  Concentration:  Concentration: Fair  Recall:  [employer] of Knowledge: Fair  Language: Good  Akathisia:  No    AIMS (if indicated): not done  Assets:  Communication Skills Desire for Improvement Housing Intimacy  ADL's:  Intact  Cognition: WNL  Sleep:  Fair   Metabolic Disorder Labs: Lab Results  Component Value Date   HGBA1C 5.0 08/21/2020   No results found for: "PROLACTIN" No results found for: "CHOL", "TRIG", "HDL", "CHOLHDL", "VLDL", "LDLCALC" Lab Results  Component Value Date   TSH 0.940 04/28/2022   TSH 0.646 07/03/2021    Therapeutic Level Labs: No results found for: "LITHIUM" No results found for: "VALPROATE" No results found for: "CBMZ"   Screenings: GAD-7    Flowsheet Row Office Visit from 08/22/2022 in [facility] - A Dept Of [place]. [facility] Office Visit from 07/29/2022 in [facility] Office Visit from 07/16/2021 in [facility] for [facility] at [facility] Office Visit from 07/03/2021 in [facility] - A Dept Of [place]. [facility] Office Visit from 12/18/2020 in [facility] - A Dept Of [place]. [facility]  Total GAD-7 Score 21 19 20 19 8       PHQ2-9    Flowsheet Row Office Visit from 08/22/2022 in [facility] - A Dept Of [place]. [facility]  Office Visit from 07/29/2022 in [facility] Office Visit from 07/22/2022 in [facility] - A Dept Of [place]. [facility] Office Visit from 06/20/2022 in [facility] - A Dept Of [place]. [facility] Office Visit from 04/14/2022 in [facility] - A Dept Of [place]. [facility]  PHQ-2 Total Score 5 6 6 5 4   PHQ-9 Total Score 22 23 24 20 19       Flowsheet Row ED to Hosp-Admission (Discharged) from 05/23/2023 in [facility]  SURGICAL Office Visit from 07/29/2022 in [facility] ED from 07/18/2022 in [facility]  C-SSRS RISK CATEGORY No Risk Low Risk Error: Question 6 not populated       Collaboration of Care: Collaboration of Care: Medication Management AEB medication prescription and Other provider involved in patient's care AEB PCP, GI, and hospital chart review  Patient/Guardian was advised Release of Information must be obtained prior to any record release in order to collaborate their care with an outside provider. Patient/Guardian was advised if they have not already done so to contact the registration department to sign all necessary forms in order for [name] to release information regarding their care.   Consent: Patient/Guardian gives verbal consent for treatment and assignment of benefits for services provided during this visit. Patient/Guardian expressed understanding and agreed to proceed.    Stasia Cavalier, MD 06/15/2023, 2:57 PM   Virtual Visit via Video Note  I connected with Akili Geidel on 06/15/23 at  8:30  AM EDT by a video enabled telemedicine application and verified that I am speaking with the correct person using two identifiers.  Location: Patient: Home Provider: Home Office   I discussed the limitations of evaluation and management by telemedicine and the availability  of in person appointments. The patient expressed understanding and agreed to proceed.   I discussed the assessment and treatment plan with the patient. The patient was provided an opportunity to ask questions and all were answered. The patient agreed with the plan and demonstrated an understanding of the instructions.   The patient was advised to call back or seek an in-person evaluation if the symptoms worsen or if the condition fails to improve as anticipated.  I provided 25 minutes of non-face-to-face time during this encounter.   Stasia Cavalier, MD

## 2023-06-18 ENCOUNTER — Encounter (HOSPITAL_COMMUNITY): Payer: Self-pay

## 2023-06-18 ENCOUNTER — Encounter (HOSPITAL_COMMUNITY): Admitting: Psychiatry

## 2023-06-18 NOTE — Progress Notes (Signed)
 This encounter was created in error - please disregard.  Patient did not show up for her appointment. Attempted to contact via telephone and a voicemail reminder was left for the patient about her appointment. This is patients second no show and she was instructed to reach out to schedule further follow ups in the future.

## 2023-06-27 ENCOUNTER — Other Ambulatory Visit (HOSPITAL_COMMUNITY): Payer: Self-pay | Admitting: Psychiatry

## 2023-06-27 DIAGNOSIS — F411 Generalized anxiety disorder: Secondary | ICD-10-CM

## 2023-06-27 DIAGNOSIS — F3181 Bipolar II disorder: Secondary | ICD-10-CM

## 2023-07-07 DIAGNOSIS — K6289 Other specified diseases of anus and rectum: Secondary | ICD-10-CM | POA: Diagnosis not present

## 2023-07-07 DIAGNOSIS — Z933 Colostomy status: Secondary | ICD-10-CM | POA: Diagnosis not present

## 2023-07-07 DIAGNOSIS — Z9049 Acquired absence of other specified parts of digestive tract: Secondary | ICD-10-CM | POA: Diagnosis not present

## 2023-07-07 DIAGNOSIS — K631 Perforation of intestine (nontraumatic): Secondary | ICD-10-CM | POA: Diagnosis not present

## 2023-07-07 DIAGNOSIS — J45909 Unspecified asthma, uncomplicated: Secondary | ICD-10-CM | POA: Diagnosis not present

## 2023-07-07 DIAGNOSIS — Z09 Encounter for follow-up examination after completed treatment for conditions other than malignant neoplasm: Secondary | ICD-10-CM | POA: Diagnosis not present

## 2023-07-07 DIAGNOSIS — K6389 Other specified diseases of intestine: Secondary | ICD-10-CM | POA: Diagnosis not present

## 2023-07-07 DIAGNOSIS — R569 Unspecified convulsions: Secondary | ICD-10-CM | POA: Diagnosis not present

## 2023-07-09 ENCOUNTER — Other Ambulatory Visit (HOSPITAL_COMMUNITY): Payer: Self-pay | Admitting: Psychiatry

## 2023-07-09 DIAGNOSIS — F3181 Bipolar II disorder: Secondary | ICD-10-CM

## 2023-07-09 DIAGNOSIS — F411 Generalized anxiety disorder: Secondary | ICD-10-CM

## 2023-07-14 ENCOUNTER — Telehealth (HOSPITAL_BASED_OUTPATIENT_CLINIC_OR_DEPARTMENT_OTHER): Admitting: Psychiatry

## 2023-07-14 ENCOUNTER — Telehealth (HOSPITAL_COMMUNITY): Payer: Self-pay | Admitting: Professional

## 2023-07-14 DIAGNOSIS — F411 Generalized anxiety disorder: Secondary | ICD-10-CM

## 2023-07-14 DIAGNOSIS — F3181 Bipolar II disorder: Secondary | ICD-10-CM | POA: Diagnosis not present

## 2023-07-14 MED ORDER — VENLAFAXINE HCL ER 150 MG PO CP24: 150.0000 mg | ORAL_CAPSULE | Freq: Every day | ORAL | 0 refills | Status: DC

## 2023-07-14 MED ORDER — ARIPIPRAZOLE 2 MG PO TABS: 2.0000 mg | ORAL_TABLET | Freq: Every day | ORAL | 0 refills | Status: DC

## 2023-07-14 MED ORDER — LAMOTRIGINE 100 MG PO TABS
100.0000 mg | ORAL_TABLET | Freq: Every day | ORAL | 0 refills | Status: DC
Start: 2023-07-14 — End: 2023-09-29

## 2023-07-14 NOTE — Progress Notes (Unsigned)
 BH MD/PA/NP OP Progress Note  07/14/2023 2:31 PM Barbara Simon  MRN:  [id number]  Visit Diagnosis:    ICD-10-CM   1. Bipolar 2 disorder (HCC)  F31.81 venlafaxine  XR (EFFEXOR -XR) 150 MG 24 hr capsule    lamoTRIgine  (LAMICTAL ) 100 MG tablet    ARIPiprazole  (ABILIFY ) 2 MG tablet    2. GAD (generalized anxiety disorder)  F41.1 venlafaxine  XR (EFFEXOR -XR) 150 MG 24 hr capsule    lamoTRIgine  (LAMICTAL ) 100 MG tablet    ARIPiprazole  (ABILIFY ) 2 MG tablet      Assessment:  Barbara Simon is a 48 y.o. female with a history of bipolar disorder 2 disorder, opioid use disorder on suboxone  maintenance, psychogenic seizures, GAD, and asthma who presented to [facility] at [facility] for initial evaluation on 07/29/2022.  During initial evaluation patient reported symptoms of insomnia, irritability, mood lability, dysphoria, fatigue, weight loss, amotivation, and passive SI without intent or plan.  Safety planning was reviewed and patient is aware of crisis resources.  She does have a past history of hypomania including decreased sleep and increased energy outside of the context of substance use.  Patient does have a significant past history of substance use including cocaine, opiates, marijuana and benzodiazepines.  She denies any cocaine use and opiate use disorder is stable on Suboxone .  Patient does use marijuana regularly and benzodiazepines infrequently.  Education was provided and patient was recommended to discontinue the benzodiazepine and marijuana use.  Patient also is experiencing seizure-like symptoms including tremors, rigidity, disorientation, and intermittent loss of consciousness without associated tongue biting or loss of bowel/bladder control.  Patient meets criteria for bipolar 2 disorder and generalized anxiety disorder.  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    Barbara Simon presents for  follow-up evaluation. Today, 07/14/23, patient reports    Psychotherapeutic interventions were used during today's session. From 9:35 AM to 10:00 AM. Therapeutic interventions included empathic listening, supportive therapy, cognitive and behavioral therapy, motivational interviewing. Used supportive interviewing techniques to provide emotional validation. Worked on cognitive reframing techniques and recommendations made for behavioral activation.  Improvement was evidenced by patient's participation and identified commitment to therapy goals.    Plan: - Continue Lamictal  100 mg daily for bipolar disorder - Continue Venlafaxine  150 mg daily - Continue Abilify  2 mg QD for bipolar disorder - Can restart trazodone  25 mg QHS in the future if finances improve - Continue Suboxone  8-2mg  films every day-BID (patient self tapering) managed by PCP, patient only taking 1 film a day currently, could consider Sublocade  - CMP, CBC, alkaline phosphatase, TSH, T4 reviewed - Continue to follow with neurology - PHP referral - Therapy referral - Crisis resources reviewed - Follow up in a month  Chief Complaint:  Chief Complaint  Patient presents with   Follow-up   HPI: Barbara Simon presents reporting that there has been a lot going on. She was hospitalized 3 times in the past 4 months with one being for over a month. She was airlifted to [facility] and was in the ICU for over a week.  She has been in a bit of shock since the procedure. A lot of anxiety/grief about the whole thing  Feels like she has lost her identity as a woman.   Kids not supportive, and her oldest said she wished she died. Barbara Simon is frustrated that her kids are so resentful towards her. She acknowledges that she has struggled with substance use during their upbringing.  She has noticed more memory difficulties since the surgery. Word finding    Still taking the Lamictal  and Abilify       the medicine has been working a bit and she  felt some improvement in her mental state, but her energy is still low. The only adverse side effect she noted was some mild dryness in her mouth. Overall she finds this manageable and we discussed the importance of drinking more water to help manage this. Sleep has been poor though it improves when she takes trazodone . Patient is unable to afford a refill on this for now but will reconsider if her financial situation improves in the future.   Barbara Simon has endorsed a recent increase in her stress after her neighbor shot her sisters dog. Patient was very close to this dog and her own dog was out there as well which scared Barbara Simon. She has started to have seizures again after the event. Support was provided. Patient does feel that things will start to improve as she gets some closure and more time from the event.   Discussed titrating the Effexor  today which patient was open to. Risks and benefits of the increase were reviewed.   Past Psychiatric History: Information collected from pt, medical record Prior hx "bipolar II". Has had hypomanic but not manic episodes outside of periods of substance abuse.  Patient's manic episodes appear to only occurred in context of substance use.  Patient has 2 prior psychiatric hospitalizations in 2005 and 2010 to [facility].  Has tried Lamictal , Depakote, Seroquel , Abilify , Celexa , Lexapro , Cymbalta, Remeron, trazodone , Xanax  Sober on buprenorphine  from opiates. Uses marijuana, home grown. She takes a couple Xanax from her mom when she is overwhelmed. Counseling was provided in regards to both. She denies   Past Medical History:  Past Medical History:  Diagnosis Date   Abnormal Pap smear    LSIL   Anxiety    Asthma    Bipolar 1 disorder (HCC)    Depression    Hepatitis C    two years ago was dx   Ovarian cyst    Seizure (HCC)    Thyrotoxicosis     Past Surgical History:  Procedure Laterality Date   ABDOMINAL SURGERY     APPENDECTOMY     TUBAL LIGATION       Family History:  Family History  Problem Relation Age of Onset   Cancer Maternal Grandmother    Cancer Mother        bladder   Diabetes Mother    Diabetes Brother    Diabetes Sister    Cerebral palsy Daughter    Cancer Maternal Aunt     Social History:  Social History   Socioeconomic History   Marital status: Married    Spouse name: Not on file   Number of children: Not on file   Years of education: Not on file   Highest education level: Not on file  Occupational History   Not on file  Tobacco Use   Smoking status: Former    Current packs/day: 0.50    Types: Cigarettes   Smokeless tobacco: Never   Tobacco comments:    1 pack lasts 2 days   Vaping Use   Vaping status: Never Used  Substance and Sexual Activity   Alcohol use: No   Drug use: Yes    Types: Marijuana    Comment: Occasionally.   Sexual activity: Not Currently    Birth control/protection: Surgical    Comment: tubal  Other Topics Concern   Not on file  Social History Narrative   Are you right handed or left handed? right   Are you currently employed ? 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 no   Who lives with you?  family   What type of home do you live in: 1 story or 2 story?  2 story lives up stairs 15-20 steps       Social Drivers of Health   Financial Resource Strain: Medium Risk (05/16/2023)   Received from [facility]   Overall Financial Resource Strain (CARDIA)    Difficulty of Paying Living Expenses: Somewhat hard  Food Insecurity: Food Insecurity Present (05/24/2023)   Hunger Vital Sign    Worried About Running Out of Food in the Last Year: Sometimes true    Ran Out of Food in the Last Year: Often true  Transportation Needs: No Transportation Needs (05/24/2023)   PRAPARE - Administrator, Civil Service (Medical): No    Lack of Transportation (Non-Medical): No  Physical Activity: Insufficiently Active (07/16/2021)   Exercise Vital Sign     Days of Exercise per Week: 2 days    Minutes of Exercise per Session: 20 min  Stress: Stress Concern Present (07/16/2021)   [employer] of Occupational Health - Occupational Stress Questionnaire    Feeling of Stress : Very much  Social Connections: Socially Integrated (07/16/2021)   Social Connection and Isolation Panel [NHANES]    Frequency of Communication with Friends and Family: More than three times a week    Frequency of Social Gatherings with Friends and Family: More than three times a week    Attends Religious Services: More than 4 times per year    Active Member of [employer] or Organizations: Yes    Attends Banker Meetings: More than 4 times per year    Marital Status: Living with partner    Allergies:  Allergies  Allergen Reactions   Toradol [Ketorolac Tromethamine] Hives and Itching   Adhesive [Tape] Itching and Rash    Current Medications: Current Outpatient Medications  Medication Sig Dispense Refill   acetaminophen  (TYLENOL ) 500 MG tablet Take 500 mg by mouth every 6 (six) hours as needed for moderate pain.     albuterol  (VENTOLIN  HFA) 108 (90 Base) MCG/ACT inhaler TAKE 2 PUFFS BY MOUTH EVERY 6 HOURS AS NEEDED FOR WHEEZE OR SHORTNESS OF BREATH 18 each 2   ARIPiprazole  (ABILIFY ) 2 MG tablet Take 1 tablet (2 mg total) by mouth daily. 90 tablet 0   Aspirin-Acetaminophen -Caffeine (GOODY HEADACHE PO) Take by mouth.     budesonide -formoterol  (SYMBICORT ) 80-4.5 MCG/ACT inhaler Inhale 2 puffs into the lungs in the morning and at bedtime. Also uses as a rescue inhaler every 4 hours as needed for shortness of breath or wheezing 1 each 12   buprenorphine -naloxone  (SUBOXONE ) 8-2 mg SUBL SL tablet Place 1 tablet under the tongue 3 (three) times daily. 90 tablet 2   ferrous sulfate  325 (65 FE) MG tablet Take 1 tablet (325 mg total) by mouth daily with breakfast. Take after you finish your antibiotics 30 tablet 0   fluticasone  (FLONASE ) 50 MCG/ACT nasal spray Place 1  spray into both nostrils daily. 9.9 mL 1   gabapentin  (NEURONTIN ) 300 MG capsule TAKE 3 CAPSULES (900MG  TOTAL) BY MOUTH 3 TIMES DAILY 180 capsule 5   guaiFENesin -dextromethorphan  (ROBITUSSIN DM) 100-10 MG/5ML syrup Take 5 mLs by mouth every 4 (  four) hours as needed for cough. 118 mL 0   ipratropium (ATROVENT ) 0.02 % nebulizer solution Take 2.5 mLs (0.5 mg total) by nebulization every 6 (six) hours as needed for wheezing or shortness of breath. 125 mL 3   lamoTRIgine  (LAMICTAL ) 100 MG tablet Take 1 tablet (100 mg total) by mouth daily. 90 tablet 0   naloxone  (NARCAN ) nasal spray 4 mg/0.1 mL SMARTSIG:Both Nares     pantoprazole  (PROTONIX ) 40 MG tablet Take 1 tablet (40 mg total) by mouth daily. 90 tablet 3   polyethylene glycol powder (GLYCOLAX /MIRALAX ) 17 GM/SCOOP powder 1-4 scoops daily or as needed 255 g 11   venlafaxine  XR (EFFEXOR -XR) 150 MG 24 hr capsule Take 1 capsule (150 mg total) by mouth daily with breakfast. 90 capsule 0   No current facility-administered medications for this visit.     Psychiatric Specialty Exam: Review of Systems  There were no vitals taken for this visit.There is no height or weight on file to calculate BMI.  General Appearance: Casual  Eye Contact:  Fair  Speech:  Normal Rate  Volume:  Normal  Mood:  Depressed  Affect:  Appropriate and Congruent  Thought Process:  Coherent  Orientation:  Full (Time, Place, and Person)  Thought Content: Logical   Suicidal Thoughts:  No  Homicidal Thoughts:  No  Memory:  Immediate;   Fair  Judgement:  Fair  Insight:  Fair  Psychomotor Activity:  Decreased  Concentration:  Concentration: Fair  Recall:  [employer] of Knowledge: Fair  Language: Good  Akathisia:  No    AIMS (if indicated): not done  Assets:  Communication Skills Desire for Improvement Housing Intimacy  ADL's:  Intact  Cognition: WNL  Sleep:  Fair   Metabolic Disorder Labs: Lab Results  Component Value Date   HGBA1C 5.0 08/21/2020   No  results found for: "PROLACTIN" No results found for: "CHOL", "TRIG", "HDL", "CHOLHDL", "VLDL", "LDLCALC" Lab Results  Component Value Date   TSH 0.940 04/28/2022   TSH 0.646 07/03/2021    Therapeutic Level Labs: No results found for: "LITHIUM" No results found for: "VALPROATE" No results found for: "CBMZ"   Screenings: GAD-7    Flowsheet Row Office Visit from 08/22/2022 in [facility] - A Dept Of [place]. [facility] Office Visit from 07/29/2022 in [facility] Office Visit from 07/16/2021 in [facility] Office Visit from 07/03/2021 in [facility] - A Dept Of [place]. [facility] Office Visit from 12/18/2020 in [facility] - A Dept Of [place]. [facility]  Total GAD-7 Score 21 19 20 19 8       PHQ2-9    Flowsheet Row Office Visit from 08/22/2022 in [facility] - A Dept Of [place]. [facility] Office Visit from 07/29/2022 in [facility] Office Visit from 07/22/2022 in [facility] - A Dept Of [place]. [facility] Office Visit from 06/20/2022 in [facility] Internal [facility] - A Dept Of [place]. [facility] Office Visit from 04/14/2022 in [facility] Internal [facility] - A Dept Of . [facility]  PHQ-2 Total Score 5 6 6 5 4   PHQ-9 Total Score 22 23 24 20 19       Flowsheet Row ED to Hosp-Admission (Discharged) from 05/23/2023 in [facility]  SURGICAL [facility] ED from 07/18/2022 in [facility]  C-SSRS RISK CATEGORY No Risk Low Risk Error: Question 6 not populated       Collaboration of Care: Collaboration of Care: Medication Management AEB medication prescription and Other provider involved in  patient's care AEB PCP chart review  Patient/Guardian was advised Release of Information must be obtained prior to any record release in order to collaborate their care with an outside provider. Patient/Guardian was advised if they have not already done so to contact the registration department to sign all necessary forms in order for us  to release information regarding their care.   Consent: Patient/Guardian gives verbal consent for treatment and assignment of benefits for services provided during this visit. Patient/Guardian expressed understanding and agreed to proceed.    Yves Herb, MD 07/14/2023, 2:31 PM   Virtual Visit via Video Note  I connected with Barbara Simon on 07/14/23 at  2:00 PM EDT by a video enabled telemedicine application and verified that I am speaking with the correct person using two identifiers.  Location: Patient: Home Provider: Home Office   I discussed the limitations of evaluation and management by telemedicine and the availability of in person appointments. The patient expressed understanding and agreed to proceed.   I discussed the assessment and treatment plan with the patient. The patient was provided an opportunity to ask questions and all were answered. The patient agreed with the plan and demonstrated an understanding of the instructions.   The patient was advised to call back or seek an in-person evaluation if the symptoms worsen or if the condition fails to improve as anticipated.  I provided 25 minutes of non-face-to-face time during this encounter.   Yves Herb, MD

## 2023-07-15 ENCOUNTER — Encounter (HOSPITAL_COMMUNITY): Payer: Self-pay | Admitting: Psychiatry

## 2023-07-23 DIAGNOSIS — T148XXA Other injury of unspecified body region, initial encounter: Secondary | ICD-10-CM | POA: Diagnosis not present

## 2023-07-23 DIAGNOSIS — Z933 Colostomy status: Secondary | ICD-10-CM | POA: Diagnosis not present

## 2023-07-23 DIAGNOSIS — Z09 Encounter for follow-up examination after completed treatment for conditions other than malignant neoplasm: Secondary | ICD-10-CM | POA: Diagnosis not present

## 2023-07-23 DIAGNOSIS — R6883 Chills (without fever): Secondary | ICD-10-CM | POA: Diagnosis not present

## 2023-07-23 DIAGNOSIS — F319 Bipolar disorder, unspecified: Secondary | ICD-10-CM | POA: Diagnosis not present

## 2023-07-23 DIAGNOSIS — R1032 Left lower quadrant pain: Secondary | ICD-10-CM | POA: Diagnosis not present

## 2023-07-23 DIAGNOSIS — R109 Unspecified abdominal pain: Secondary | ICD-10-CM | POA: Diagnosis not present

## 2023-07-23 DIAGNOSIS — R140 Abdominal distension (gaseous): Secondary | ICD-10-CM | POA: Diagnosis not present

## 2023-07-23 DIAGNOSIS — K651 Peritoneal abscess: Secondary | ICD-10-CM | POA: Diagnosis not present

## 2023-07-23 DIAGNOSIS — R1011 Right upper quadrant pain: Secondary | ICD-10-CM | POA: Diagnosis not present

## 2023-07-23 DIAGNOSIS — R0602 Shortness of breath: Secondary | ICD-10-CM | POA: Diagnosis not present

## 2023-07-23 DIAGNOSIS — R1030 Lower abdominal pain, unspecified: Secondary | ICD-10-CM | POA: Diagnosis not present

## 2023-07-23 DIAGNOSIS — R1012 Left upper quadrant pain: Secondary | ICD-10-CM | POA: Diagnosis not present

## 2023-07-23 DIAGNOSIS — R29898 Other symptoms and signs involving the musculoskeletal system: Secondary | ICD-10-CM | POA: Diagnosis not present

## 2023-07-23 DIAGNOSIS — R1084 Generalized abdominal pain: Secondary | ICD-10-CM | POA: Diagnosis not present

## 2023-07-24 DIAGNOSIS — R140 Abdominal distension (gaseous): Secondary | ICD-10-CM | POA: Diagnosis not present

## 2023-07-24 DIAGNOSIS — R109 Unspecified abdominal pain: Secondary | ICD-10-CM | POA: Diagnosis not present

## 2023-08-03 ENCOUNTER — Ambulatory Visit: Payer: Self-pay

## 2023-08-03 ENCOUNTER — Emergency Department (HOSPITAL_COMMUNITY)
Admission: EM | Admit: 2023-08-03 | Discharge: 2023-08-03 | Disposition: A | Discharge status: Home / Self Care | Attending: Emergency Medicine | Admitting: Emergency Medicine

## 2023-08-03 ENCOUNTER — Other Ambulatory Visit (HOSPITAL_COMMUNITY)

## 2023-08-03 ENCOUNTER — Emergency Department (HOSPITAL_COMMUNITY)

## 2023-08-03 ENCOUNTER — Other Ambulatory Visit: Payer: Self-pay

## 2023-08-03 ENCOUNTER — Encounter (HOSPITAL_COMMUNITY): Payer: Self-pay | Admitting: *Deleted

## 2023-08-03 DIAGNOSIS — S199XXA Unspecified injury of neck, initial encounter: Secondary | ICD-10-CM | POA: Diagnosis not present

## 2023-08-03 DIAGNOSIS — S0083XA Contusion of other part of head, initial encounter: Secondary | ICD-10-CM | POA: Diagnosis not present

## 2023-08-03 DIAGNOSIS — S3991XA Unspecified injury of abdomen, initial encounter: Secondary | ICD-10-CM | POA: Diagnosis not present

## 2023-08-03 DIAGNOSIS — S8002XA Contusion of left knee, initial encounter: Secondary | ICD-10-CM | POA: Diagnosis not present

## 2023-08-03 DIAGNOSIS — S8992XA Unspecified injury of left lower leg, initial encounter: Secondary | ICD-10-CM | POA: Diagnosis not present

## 2023-08-03 DIAGNOSIS — S299XXA Unspecified injury of thorax, initial encounter: Secondary | ICD-10-CM | POA: Diagnosis not present

## 2023-08-03 DIAGNOSIS — R109 Unspecified abdominal pain: Secondary | ICD-10-CM | POA: Diagnosis not present

## 2023-08-03 DIAGNOSIS — S20212A Contusion of left front wall of thorax, initial encounter: Secondary | ICD-10-CM | POA: Insufficient documentation

## 2023-08-03 DIAGNOSIS — S82002A Unspecified fracture of left patella, initial encounter for closed fracture: Secondary | ICD-10-CM | POA: Diagnosis not present

## 2023-08-03 DIAGNOSIS — R600 Localized edema: Secondary | ICD-10-CM | POA: Diagnosis not present

## 2023-08-03 DIAGNOSIS — S0990XA Unspecified injury of head, initial encounter: Secondary | ICD-10-CM | POA: Diagnosis not present

## 2023-08-03 DIAGNOSIS — Y92410 Unspecified street and highway as the place of occurrence of the external cause: Secondary | ICD-10-CM | POA: Insufficient documentation

## 2023-08-03 DIAGNOSIS — R918 Other nonspecific abnormal finding of lung field: Secondary | ICD-10-CM | POA: Diagnosis not present

## 2023-08-03 DIAGNOSIS — S3993XA Unspecified injury of pelvis, initial encounter: Secondary | ICD-10-CM | POA: Diagnosis not present

## 2023-08-03 LAB — URINALYSIS, ROUTINE W REFLEX MICROSCOPIC
Bacteria, UA: NONE SEEN
Bilirubin Urine: NEGATIVE
Glucose, UA: NEGATIVE mg/dL
Hgb urine dipstick: NEGATIVE
Ketones, ur: NEGATIVE mg/dL
Nitrite: NEGATIVE
Protein, ur: NEGATIVE mg/dL
Specific Gravity, Urine: 1.031 — ABNORMAL HIGH (ref 1.005–1.030)
pH: 5 (ref 5.0–8.0)

## 2023-08-03 LAB — CBC
HCT: 37.3 % (ref 36.0–46.0)
Hemoglobin: 11.8 g/dL — ABNORMAL LOW (ref 12.0–15.0)
MCH: 27.8 pg (ref 26.0–34.0)
MCHC: 31.6 g/dL (ref 30.0–36.0)
MCV: 87.8 fL (ref 80.0–100.0)
Platelets: 329 10*3/uL (ref 150–400)
RBC: 4.25 MIL/uL (ref 3.87–5.11)
RDW: 17.2 % — ABNORMAL HIGH (ref 11.5–15.5)
WBC: 7.8 10*3/uL (ref 4.0–10.5)
nRBC: 0 % (ref 0.0–0.2)

## 2023-08-03 LAB — COMPREHENSIVE METABOLIC PANEL WITH GFR
ALT: 14 U/L (ref 0–44)
AST: 17 U/L (ref 15–41)
Albumin: 3.2 g/dL — ABNORMAL LOW (ref 3.5–5.0)
Alkaline Phosphatase: 98 U/L (ref 38–126)
Anion gap: 7 (ref 5–15)
BUN: 15 mg/dL (ref 6–20)
CO2: 23 mmol/L (ref 22–32)
Calcium: 8.9 mg/dL (ref 8.9–10.3)
Chloride: 105 mmol/L (ref 98–111)
Creatinine, Ser: 0.86 mg/dL (ref 0.44–1.00)
GFR, Estimated: >60 mL/min (ref >60)
Glucose, Bld: 104 mg/dL — ABNORMAL HIGH (ref 70–99)
Potassium: 3.8 mmol/L (ref 3.5–5.1)
Sodium: 135 mmol/L (ref 135–145)
Total Bilirubin: 0.2 mg/dL (ref 0.0–1.2)
Total Protein: 6.5 g/dL (ref 6.5–8.1)

## 2023-08-03 LAB — POC URINE PREG, ED: Preg Test, Ur: NEGATIVE

## 2023-08-03 LAB — I-STAT CHEM 8, ED
BUN: 14 mg/dL (ref 6–20)
Calcium, Ion: 1.17 mmol/L (ref 1.15–1.40)
Chloride: 105 mmol/L (ref 98–111)
Creatinine, Ser: 0.9 mg/dL (ref 0.44–1.00)
Glucose, Bld: 107 mg/dL — ABNORMAL HIGH (ref 70–99)
HCT: 35 % — ABNORMAL LOW (ref 36.0–46.0)
Hemoglobin: 11.9 g/dL — ABNORMAL LOW (ref 12.0–15.0)
Potassium: 4 mmol/L (ref 3.5–5.1)
Sodium: 139 mmol/L (ref 135–145)
TCO2: 23 mmol/L (ref 22–32)

## 2023-08-03 LAB — PROTIME-INR
INR: 0.9 (ref 0.8–1.2)
Prothrombin Time: 12.8 s (ref 11.4–15.2)

## 2023-08-03 LAB — SAMPLE TO BLOOD BANK

## 2023-08-03 LAB — ETHANOL: Alcohol, Ethyl (B): <15 mg/dL (ref <15)

## 2023-08-03 LAB — LACTIC ACID, PLASMA: Lactic Acid, Venous: 1.4 mmol/L (ref 0.5–1.9)

## 2023-08-03 MED ORDER — IOHEXOL 300 MG/ML  SOLN
100.0000 mL | Freq: Once | INTRAMUSCULAR | Status: AC | PRN
  Administered 2023-08-03: 100 mL via INTRAVENOUS

## 2023-08-03 MED ORDER — METHOCARBAMOL 500 MG PO TABS: 500.0000 mg | ORAL_TABLET | Freq: Two times a day (BID) | ORAL | 0 refills | Status: DC | PRN

## 2023-08-03 MED ORDER — FENTANYL CITRATE PF 50 MCG/ML IJ SOSY
50.0000 ug | PREFILLED_SYRINGE | Freq: Once | INTRAMUSCULAR | Status: AC
  Administered 2023-08-03: 50 ug via INTRAVENOUS
  Filled 2023-08-03: qty 1

## 2023-08-03 NOTE — Discharge Instructions (Signed)
 Your testing today showed that you do not have any broken bones, your knee looks good but because of the swelling and the bruising we will put you in a knee brace and have you follow-up with the orthopedist.  I did order a CT scan just in case there was a small fracture that was missed, the orthopedist can follow-up the results of this with you.  Because of the allergies that you have to anti-inflammatories I would recommend that you take Tylenol  for pain, ice for swelling and Robaxin  for muscle spasms.  Thank you for allowing us  to treat you in the emergency department today.  After reviewing your examination and potential testing that was done it appears that you are safe to go home.  I would like for you to follow-up with your doctor within the next several days, have them obtain your records and follow-up with them to review all potential tests and results from your visit.  If you should develop severe or worsening symptoms return to the emergency department immediately  [facility] Primary Care Doctor List    Alberteen Huge, MD. Specialty: [facility] Medicine Contact information: [address]  [place] [address]  [phone]   Charlotta Cook, MD. Specialty: [facility] Medicine Contact information: [address]  [place] [address]  [phone]   Clary Crown, MD Specialty: Internal Medicine Contact information: [address] [place] [address]  [phone]   Lucendia Rusk, MD. Specialty: Internal Medicine Contact information: [address]  [place] [address]  [phone]    [facility] (Dr. Burdette Carolin) Specialty: Family Medicine Contact information: [address]  [place] [address]  [phone]   Artemisa Bile, MD. Specialty: Internal Medicine Contact information: [address]  [address]  [place] [address]  [phone]   [facility]  [address] [place], [place]  [address] [phone]  Services [facility] offers a variety of basic health services.  People needing more complex services will be directed to a physician online. Using these virtual visits, doctors can evaluate and prescribe medicine and treatments. There will be no medication on-site, though [facility] will help patients fill their prescriptions at little to no cost.   [facility] PRIMARY CARE [address] [place]  [address]  [phone]     [facility] Outpatient Behavioral Health at [facility] [address] [place]  [address]  [phone]   For More information please go to: [email or web address]

## 2023-08-03 NOTE — ED Notes (Signed)
 Unable to collect urine at this time. Pt stated, " I do not have to pee right now."
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
## 2023-08-03 NOTE — ED Notes (Signed)
 Still unable to use the bathroom at this time
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
## 2023-08-03 NOTE — ED Notes (Signed)
 Pt refused crutches.

## 2023-08-07 ENCOUNTER — Other Ambulatory Visit: Payer: Self-pay

## 2023-08-07 DIAGNOSIS — J4530 Mild persistent asthma, uncomplicated: Secondary | ICD-10-CM

## 2023-08-07 NOTE — Telephone Encounter (Signed)
 Barbara Simon

## 2023-08-08 MED ORDER — ALBUTEROL SULFATE HFA 108 (90 BASE) MCG/ACT IN AERS: 1.0000 | INHALATION_SPRAY | Freq: Four times a day (QID) | RESPIRATORY_TRACT | 2 refills | Status: DC | PRN

## 2023-08-09 ENCOUNTER — Other Ambulatory Visit: Payer: Self-pay | Admitting: Internal Medicine

## 2023-08-11 ENCOUNTER — Other Ambulatory Visit: Payer: Self-pay | Admitting: Student

## 2023-08-11 ENCOUNTER — Telehealth: Payer: Self-pay

## 2023-08-11 DIAGNOSIS — J4530 Mild persistent asthma, uncomplicated: Secondary | ICD-10-CM

## 2023-08-11 NOTE — Telephone Encounter (Signed)
 Barbara Simon (Key: [id number]) PA Case ID #: [id number] Rx #: [id number] Need Help? Call us  at [phone] Outcome Denied today by [employer] 2017 Denied Drug Albuterol  Sulfate HFA 108 (90 Base)MCG/ACT aerosol ePA cloud logo Form [employer] Electronic Prior Authorization Form Original Claim Info 75  No additional information has been provided on why the request has been denied.

## 2023-08-11 NOTE — Telephone Encounter (Signed)
 Medication sent to pharmacy

## 2023-08-11 NOTE — Telephone Encounter (Signed)
 Prior Authorization for patient (Albuterol  Sulfate HFA 108 (90 Base)MCG/ACT aerosol) came through on cover my meds was submitted with last office notes awaiting approval or denial.  ZOX:[id number]

## 2023-08-12 MED ORDER — ALBUTEROL SULFATE HFA 108 (90 BASE) MCG/ACT IN AERS: 2.0000 | INHALATION_SPRAY | Freq: Four times a day (QID) | RESPIRATORY_TRACT | 3 refills | Status: AC | PRN

## 2023-08-12 NOTE — Telephone Encounter (Signed)
 Per patients insurance the request has been denied, the patient has to try 2 of the preferred drugs which are ventolin  HFA (brand name) and Xopenex HFA Inhaler (brand name)

## 2023-08-12 NOTE — Addendum Note (Signed)
 Addended by: Traniece Boffa on: 08/12/2023 10:53 AM   Modules accepted: Orders

## 2023-08-17 ENCOUNTER — Encounter: Payer: Self-pay | Admitting: Internal Medicine

## 2023-08-17 ENCOUNTER — Ambulatory Visit (INDEPENDENT_AMBULATORY_CARE_PROVIDER_SITE_OTHER): Payer: Self-pay | Admitting: Internal Medicine

## 2023-08-17 VITALS — BP 103/72 | HR 101 | Temp 98.5°F | Ht 63.0 in | Wt 139.8 lb

## 2023-08-17 DIAGNOSIS — Z433 Encounter for attention to colostomy: Secondary | ICD-10-CM | POA: Insufficient documentation

## 2023-08-17 DIAGNOSIS — Z933 Colostomy status: Secondary | ICD-10-CM | POA: Diagnosis not present

## 2023-08-17 DIAGNOSIS — F1190 Opioid use, unspecified, uncomplicated: Secondary | ICD-10-CM | POA: Diagnosis not present

## 2023-08-17 MED ORDER — BUPRENORPHINE HCL-NALOXONE HCL 8-2 MG SL SUBL: 1.0000 | SUBLINGUAL_TABLET | Freq: Three times a day (TID) | SUBLINGUAL | 0 refills | Status: DC

## 2023-08-17 NOTE — Patient Instructions (Signed)
 Barbara Simon,   It was a pleasure seeing you today.   I am refilling your suboxone  today, and also placing a referral to GI for a colonoscopy. We will plan to see you again in about 3 months. Please make sure to call the clinic when you need another suboxone  refill.   Thanks,  Dr Esaw Heckler

## 2023-08-17 NOTE — Addendum Note (Signed)
 Addended by: Jayson Michael on: 08/17/2023 01:35 PM   Modules accepted: Orders

## 2023-08-17 NOTE — Assessment & Plan Note (Signed)
 Patient here today for OUD follow up and suboxone  refill. She missed her last appointment due to lots of recent health complications including perforated bowel requiring colostomy. Due to her health concerns and many other life complications, she does admitted to using meth multiple times this week. I feel comfortable ordering a 1 month supply of her suboxone  today. I instructed her to call for a refill before she runs out of her current suboxone  supply.  - Suboxone  8-2mg  TID for 30 day supply ordered - [facility] today

## 2023-08-17 NOTE — Progress Notes (Signed)
 Subjective:  CC: suboxone  follow up  HPI:  Barbara Simon is a 48 y.o. female with a past medical history stated below and presents today for above. Please see problem based assessment and plan for additional details.  Past Medical History:  Diagnosis Date   Abnormal Pap smear    LSIL   Anxiety    Asthma    Bipolar 1 disorder (HCC)    Depression    Hepatitis C    two years ago was dx   Ovarian cyst    Seizure (HCC)    Thyrotoxicosis    Current Outpatient Medications on File Prior to Visit  Medication Sig Dispense Refill   acetaminophen  (TYLENOL ) 500 MG tablet Take 500 mg by mouth every 6 (six) hours as needed for moderate pain.     albuterol  (VENTOLIN  HFA) 108 (90 Base) MCG/ACT inhaler Inhale 2 puffs into the lungs every 6 (six) hours as needed for wheezing or shortness of breath. 8 g 3   ARIPiprazole  (ABILIFY ) 2 MG tablet Take 1 tablet (2 mg total) by mouth daily. 90 tablet 0   Aspirin-Acetaminophen -Caffeine (GOODY HEADACHE PO) Take by mouth.     budesonide -formoterol  (SYMBICORT ) 80-4.5 MCG/ACT inhaler Inhale 2 puffs into the lungs in the morning and at bedtime. Also uses as a rescue inhaler every 4 hours as needed for shortness of breath or wheezing 1 each 12   ferrous sulfate  325 (65 FE) MG tablet Take 1 tablet (325 mg total) by mouth daily with breakfast. Take after you finish your antibiotics 30 tablet 0   fluticasone  (FLONASE ) 50 MCG/ACT nasal spray SPRAY 1 SPRAY INTO BOTH NOSTRILS DAILY. 16 mL 1   gabapentin  (NEURONTIN ) 300 MG capsule TAKE 3 CAPSULES (900MG  TOTAL) BY MOUTH 3 TIMES DAILY 180 capsule 5   guaiFENesin -dextromethorphan  (ROBITUSSIN DM) 100-10 MG/5ML syrup Take 5 mLs by mouth every 4 (four) hours as needed for cough. 118 mL 0   ipratropium (ATROVENT ) 0.02 % nebulizer solution Take 2.5 mLs (0.5 mg total) by nebulization every 6 (six) hours as needed for wheezing or shortness of breath. 125 mL 3   lamoTRIgine  (LAMICTAL ) 100 MG tablet Take 1 tablet (100 mg  total) by mouth daily. 90 tablet 0   methocarbamol  (ROBAXIN ) 500 MG tablet Take 1 tablet (500 mg total) by mouth 2 (two) times daily as needed for muscle spasms. 20 tablet 0   naloxone  (NARCAN ) nasal spray 4 mg/0.1 mL SMARTSIG:Both Nares     pantoprazole  (PROTONIX ) 40 MG tablet Take 1 tablet (40 mg total) by mouth daily. 90 tablet 3   polyethylene glycol powder (GLYCOLAX /MIRALAX ) 17 GM/SCOOP powder 1-4 scoops daily or as needed 255 g 11   venlafaxine  XR (EFFEXOR -XR) 150 MG 24 hr capsule Take 1 capsule (150 mg total) by mouth daily with breakfast. 90 capsule 0   No current facility-administered medications on file prior to visit.   Review of Systems: ROS negative except for as is noted on the assessment and plan.  Objective:   Vitals:   08/17/23 1020  BP: 103/72  Pulse: (!) 101  Temp: 98.5 F (36.9 C)  TempSrc: Oral  SpO2: 99%  Weight: 139 lb 12.8 oz (63.4 kg)  Height: 5\' 3"  (1.6 m)   Physical Exam: Constitutional: well-appearing, in no acute distress Cardiovascular: regular rate and rhythm Pulmonary/Chest: normal work of breathing on room air MSK: normal bulk and tone  Assessment & Plan:   Opioid use disorder Patient here today for OUD follow up and suboxone  refill.  She missed her last appointment due to lots of recent health complications including perforated bowel requiring colostomy. Due to her health concerns and many other life complications, she does admitted to using meth multiple times this week. I feel comfortable ordering a 1 month supply of her suboxone  today. I instructed her to call for a refill before she runs out of her current suboxone  supply.  - Suboxone  8-2mg  TID for 30 day supply ordered - [facility] today  Colostomy care [facility]) Patient stated she needs a colonoscopy prior to her colostomy takedown. Referral to GI for colonoscopy placed.     Patient discussed with Dr. Tyrell Gallo MD [facility] Internal Medicine  PGY-1 Pager:  [phone] Date 08/17/2023  Time 1:05 PM

## 2023-08-17 NOTE — Assessment & Plan Note (Signed)
 Patient stated she needs a colonoscopy prior to her colostomy takedown. Referral to GI for colonoscopy placed.

## 2023-08-18 NOTE — Progress Notes (Signed)
 Internal Medicine Clinic Attending  Case discussed with the resident at the time of the visit.  We reviewed the resident's history and exam and pertinent patient test results.  I agree with the assessment, diagnosis, and plan of care documented in the resident's note.

## 2023-08-19 LAB — TOXASSURE SELECT,+ANTIDEPR,UR

## 2023-08-20 ENCOUNTER — Ambulatory Visit: Admitting: Adult Health

## 2023-08-20 DIAGNOSIS — Z0289 Encounter for other administrative examinations: Secondary | ICD-10-CM

## 2023-08-20 NOTE — Telephone Encounter (Signed)
 Spoke with the patient in regards to another Referral being placed.  Also spoke with Dr. Esaw Heckler who will be reaching out to the patient in regards to keeping her current GI appt with [facility].  Copied from CRM [id number]. Topic: Referral - Request for Referral >> Aug 19, 2023 10:01 AM Barbara Simon wrote: Dr Esaw Heckler stated at last visit he will send a referral for a colonoscopy . Pt Pilley would like to know the status of that request.   Please advise
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
## 2023-08-27 ENCOUNTER — Telehealth (HOSPITAL_COMMUNITY): Admitting: Psychiatry

## 2023-08-27 DIAGNOSIS — F411 Generalized anxiety disorder: Secondary | ICD-10-CM | POA: Diagnosis not present

## 2023-08-27 DIAGNOSIS — F3181 Bipolar II disorder: Secondary | ICD-10-CM

## 2023-08-27 MED ORDER — VENLAFAXINE HCL ER 75 MG PO CP24
225.0000 mg | ORAL_CAPSULE | Freq: Every day | ORAL | 2 refills | Status: DC
Start: 2023-08-27 — End: 2023-09-29

## 2023-08-28 ENCOUNTER — Encounter (HOSPITAL_COMMUNITY): Payer: Self-pay | Admitting: Psychiatry

## 2023-09-01 DIAGNOSIS — Z09 Encounter for follow-up examination after completed treatment for conditions other than malignant neoplasm: Secondary | ICD-10-CM | POA: Diagnosis not present

## 2023-09-01 DIAGNOSIS — K6389 Other specified diseases of intestine: Secondary | ICD-10-CM | POA: Diagnosis not present

## 2023-09-02 ENCOUNTER — Other Ambulatory Visit: Payer: Self-pay | Admitting: Internal Medicine

## 2023-09-02 DIAGNOSIS — Z129 Encounter for screening for malignant neoplasm, site unspecified: Secondary | ICD-10-CM

## 2023-09-03 ENCOUNTER — Telehealth (HOSPITAL_COMMUNITY): Payer: Self-pay | Admitting: Professional

## 2023-09-03 ENCOUNTER — Encounter: Payer: Self-pay | Admitting: Internal Medicine

## 2023-09-03 NOTE — Telephone Encounter (Signed)
 See call log

## 2023-09-07 ENCOUNTER — Telehealth: Payer: Self-pay | Admitting: *Deleted

## 2023-09-07 DIAGNOSIS — K631 Perforation of intestine (nontraumatic): Secondary | ICD-10-CM | POA: Diagnosis not present

## 2023-09-07 DIAGNOSIS — Z933 Colostomy status: Secondary | ICD-10-CM | POA: Diagnosis not present

## 2023-09-07 NOTE — Telephone Encounter (Signed)
 Mammogram appointment 10/13/2023 @ 9:40 am arrive 9:20 am / breast center [phone] / appointment mailed to patient.

## 2023-09-08 ENCOUNTER — Telehealth (HOSPITAL_COMMUNITY): Payer: Self-pay | Admitting: Professional

## 2023-09-08 NOTE — Telephone Encounter (Signed)
 See call log

## 2023-09-17 DIAGNOSIS — H5213 Myopia, bilateral: Secondary | ICD-10-CM | POA: Diagnosis not present

## 2023-09-22 ENCOUNTER — Encounter

## 2023-09-22 ENCOUNTER — Other Ambulatory Visit (HOSPITAL_COMMUNITY): Payer: Self-pay | Admitting: Psychiatry

## 2023-09-22 DIAGNOSIS — F411 Generalized anxiety disorder: Secondary | ICD-10-CM

## 2023-09-22 DIAGNOSIS — F3181 Bipolar II disorder: Secondary | ICD-10-CM

## 2023-09-22 NOTE — Progress Notes (Deleted)
 Patient name: Barbara Simon Date of birth: Dec 05, 1975 Date of visit: 09/22/23  Type of visit: {imcvsttype:33115}   Subjective   Chief concern: No chief complaint on file.   Barbara Simon is a 48 y.o. female with a PMHx of *** who presents to [facility] clinic {imcrsn:33113}   Patient Active Problem List   Diagnosis Date Noted   Colostomy care (HCC) 08/17/2023   CAP (community acquired pneumonia) 05/25/2023   Community acquired pneumonia 05/24/2023   Sepsis (HCC) 05/24/2023   Bipolar 2 disorder (HCC) 07/29/2022   GAD (generalized anxiety disorder) 07/29/2022   Hot flashes 07/28/2022   Irregular menses 07/28/2022   DUB (dysfunctional uterine bleeding) 07/28/2022   Elevated serum creatinine 07/28/2022   Tired 07/28/2022   Viral URI 07/22/2022   Rectocele, Grade 3 04/28/2022   Perimenopause 04/28/2022   Vomiting 04/14/2022   Left lumbar radiculopathy 02/22/2022   Eczema 09/19/2021   Swelling of lower extremity 09/19/2021   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/03/2021   Posterior vaginal wall prolapse 06/14/2021   Healthcare maintenance 05/13/2021   History of colonic polyps 2019 01/08/2021   Asthma 12/24/2020   Tobacco use 11/20/2020   Nasal polyp 11/20/2020   Stress at home 09/25/2020   Bipolar II disorder (HCC) 07/24/2020   Peripheral neuropathy 07/24/2020   Carpal tunnel syndrome, bilateral 07/24/2020   Opioid use disorder 12/20/2015   Cervical high risk HPV (human papillomavirus) test positive 03/29/2013   History of hepatitis C 02/15/2013   Major depression 02/03/2013     Past Surgical History:  Procedure Laterality Date   ABDOMINAL SURGERY     APPENDECTOMY     TUBAL LIGATION      ROS  Current Outpatient Medications  Medication Instructions   acetaminophen  (TYLENOL ) 500 mg, Every 6 hours PRN   albuterol  (VENTOLIN  HFA) 108 (90 Base) MCG/ACT inhaler 2 puffs, Inhalation, Every 6 hours PRN   ARIPiprazole   (ABILIFY ) 2 mg, Oral, Daily   Aspirin-Acetaminophen -Caffeine (GOODY HEADACHE PO) Take by mouth.   budesonide -formoterol  (SYMBICORT ) 80-4.5 MCG/ACT inhaler 2 puffs, Inhalation, 2 times daily, Also uses as a rescue inhaler every 4 hours as needed for shortness of breath or wheezing   buprenorphine -naloxone  (SUBOXONE ) 8-2 mg SUBL SL tablet 1 tablet, Sublingual, 3 times daily   FeroSul 325 mg, Oral, Daily with breakfast, Take after you finish your antibiotics   fluticasone  (FLONASE ) 50 MCG/ACT nasal spray SPRAY 1 SPRAY INTO BOTH NOSTRILS DAILY.   gabapentin  (NEURONTIN ) 300 MG capsule TAKE 3 CAPSULES (900MG  TOTAL) BY MOUTH 3 TIMES DAILY   guaiFENesin -dextromethorphan  (ROBITUSSIN DM) 100-10 MG/5ML syrup 5 mLs, Oral, Every 4 hours PRN   ipratropium (ATROVENT ) 0.5 mg, Nebulization, Every 6 hours PRN   lamoTRIgine  (LAMICTAL ) 100 mg, Oral, Daily   methocarbamol  (ROBAXIN ) 500 mg, Oral, 2 times daily PRN   naloxone  (NARCAN ) nasal spray 4 mg/0.1 mL SMARTSIG:Both Nares   pantoprazole  (PROTONIX ) 40 mg, Oral, Daily   polyethylene glycol powder (GLYCOLAX /MIRALAX ) 17 GM/SCOOP powder 1-4 scoops daily or as needed   venlafaxine  XR (EFFEXOR -XR) 225 mg, Oral, Daily with breakfast    Social History   Tobacco Use   Smoking status: Former    Current packs/day: 0.50    Types: Cigarettes   Smokeless tobacco: Never   Tobacco comments:    1 pack lasts 2 days   Vaping Use   Vaping status: Never Used  Substance Use Topics   Alcohol use: No   Drug use: Yes  Types: Marijuana    Comment: Occasionally.      Objective  There were no vitals filed for this visit.There is no height or weight on file to calculate BMI.   Physical Exam  {Labs (Optional):23779}    Assessment & Plan  Problem List Items Addressed This Visit   None   No follow-ups on file.  Patient discussed with Dr. {imcattendings:33109}, who also saw and evaluated the patient.  Armando Rossetti, MD [facility]  PGY-1 09/22/2023, 8:37  AM

## 2023-09-28 NOTE — Progress Notes (Unsigned)
 BH MD/PA/NP OP Progress Note  09/29/2023 1:39 PM Barbara Simon  MRN:  [id number]  Visit Diagnosis:    ICD-10-CM   1. Bipolar 2 disorder (HCC)  F31.81 lamoTRIgine  (LAMICTAL ) 100 MG tablet    ARIPiprazole  (ABILIFY ) 2 MG tablet    venlafaxine  XR (EFFEXOR -XR) 75 MG 24 hr capsule    2. GAD (generalized anxiety disorder)  F41.1 lamoTRIgine  (LAMICTAL ) 100 MG tablet    ARIPiprazole  (ABILIFY ) 2 MG tablet    venlafaxine  XR (EFFEXOR -XR) 75 MG 24 hr capsule       Assessment:  Barbara Simon is a 48 y.o. female with a history of bipolar disorder 2 disorder, opioid use disorder on suboxone  maintenance, psychogenic seizures, GAD, and asthma who presented to [facility] at [facility] for initial evaluation on 07/29/2022.  During initial evaluation patient reported symptoms of insomnia, irritability, mood lability, dysphoria, fatigue, weight loss, amotivation, and passive SI without intent or plan.  Safety planning was reviewed and patient is aware of crisis resources.  She does have a past history of hypomania including decreased sleep and increased energy outside of the context of substance use.  Patient does have a significant past history of substance use including cocaine, opiates, marijuana and benzodiazepines.  She denies any cocaine use and opiate use disorder is stable on Suboxone .  Patient does use marijuana regularly and benzodiazepines infrequently.  Education was provided and patient was recommended to discontinue the benzodiazepine and marijuana use.  Patient also is experiencing seizure-like symptoms including tremors, rigidity, disorientation, and intermittent loss of consciousness without associated tongue biting or loss of bowel/bladder control.  Patient meets criteria for bipolar 2 disorder and generalized anxiety disorder.  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    Barbara Simon presents for  follow-up evaluation. Today, 09/29/23, patient reports continued neurovegetative symptoms of depression and anxiety.  Anxiety symptoms had a transient increase with return of pseudoseizures in the interim.  This correlated with patient attempted to return to work and had been unable to keep up.  There has been slight improvement in mood lability in the interim.  Patient tolerated the increase venlafaxine  well denying any adverse side effects.  She continues to take her medications consistently.  Did review the urine screen the patient admitted to methamphetamine use.  Notes that she had used it in attempt to numb herself.  Patient is interested in maintaining sobriety moving forward and is aware that it will only make her mental health symptoms worse.  Furthermore some of the ongoing interpersonal conflict is related to the suspected substance use and continuing that would only exacerbate the interpersonal conflict with her kids.  We will continue on current regimen and follow up in 6 weeks.  Patient was also referred to [facility].  Psychotherapeutic interventions were used during today's session. From 1:06 PM to 1:26 PM. Therapeutic interventions included empathic listening, supportive therapy, cognitive and behavioral therapy, motivational interviewing. Used supportive interviewing techniques to provide emotional validation. Worked on cognitive reframing techniques and recommendations made for behavioral activation.  Improvement was evidenced by patient's participation and identified commitment to therapy goals.   Plan: - Continue Lamictal  100 mg daily for bipolar disorder - Continue Venlafaxine  225 mg daily - Continue Abilify  2 mg QD for bipolar disorder - Can restart trazodone  25 mg QHS in the future if finances improve - Continue Suboxone  8-2mg  films every day-BID (patient self tapering) managed by PCP - CMP, CBC, alkaline phosphatase,  TSH, T4 reviewed - Continue to follow with neurology - PHP referral -  Therapy resources provided - Crisis resources reviewed - Follow up in a month  Chief Complaint:  Chief Complaint  Patient presents with   Follow-up   HPI: Barbara Simon presents reporting that things are still pretty bad.  She is endorsing low mood, anhedonia, amotivation, decreased appetite, increased insomnia, negative self thoughts, feelings of hopelessness.  She denies any SI at this time.  The 1 positive that she identified from the past month is that her moods are slightly less labile.  She tolerated the venlafaxine  increase and denied any adverse side effects.  Patient endorses the ongoing psychosocial stressors including current homelessness, ongoing interpersonal conflict with her kids, body image concerns, and still dealing with the colostomy bag.  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  She had been unable to sustain and however is her anxiety increased and the seizure-like episodes returned once she started working again.  Patient reported feeling worthless on her inability to support herself or her husband.  She has filed for disability now.  We discussed her urine toxicology that was positive for methamphetamine back in the beginning of June.  Patient admitted that she had used shortly prior to that and had not disclosed that this provider due to embarrassment.  She reports using the methamphetamine as a way to numb herself to try and escape the anguish of her current situation.  She reports having used couple times since then though only once after her appointment last month.  We discussed the negative impacts with substance use and how it is only going to make her psychosocial stressors worse in addition to her mood.  Patient acknowledged this and reports being aware that to get back towards maintaining her sobriety.  Had mentioned substance use programs as well as CDIOP however patient did not feel like these were necessary at this time.  Furthermore she  does not have the ability to attend an in person program.  Did recommend the [facility] program which patient had declined previously due to concern of losing phone.  She was able to keep her phone active and is open to giving PHP a try.  Past Psychiatric History: Information collected from pt, medical record Prior hx bipolar II. Has had hypomanic but not manic episodes outside of periods of substance abuse.  Patient's manic episodes appear to only occurred in context of substance use.  Patient has 2 prior psychiatric hospitalizations in 2005 and 2010 to [facility].  Has tried Lamictal , Depakote, Seroquel , Abilify , Celexa , Lexapro , Cymbalta, Remeron, trazodone , Xanax  Sober on buprenorphine  from opiates. Uses marijuana, home grown. She takes a couple Xanax from her mom when she is overwhelmed. Counseling was provided in regards to both. She denies   Past Medical History:  Past Medical History:  Diagnosis Date   Abnormal Pap smear    LSIL   Anxiety    Asthma    Bipolar 1 disorder (HCC)    Depression    Hepatitis C    two years ago was dx   Ovarian cyst    Seizure (HCC)    Thyrotoxicosis     Past Surgical History:  Procedure Laterality Date   ABDOMINAL SURGERY     APPENDECTOMY     TUBAL LIGATION      Family History:  Family History  Problem Relation Age of Onset   Cancer Maternal Grandmother    Cancer Mother  bladder   Diabetes Mother    Diabetes Brother    Diabetes Sister    Cerebral palsy Daughter    Cancer Maternal Aunt     Social History:  Social History   Socioeconomic History   Marital status: Married    Spouse name: Not on file   Number of children: Not on file   Years of education: Not on file   Highest education level: Not on file  Occupational History   Not on file  Tobacco Use   Smoking status: Former    Current packs/day: 0.50    Types: Cigarettes   Smokeless tobacco: Never   Tobacco comments:    1 pack lasts 2 days   Vaping Use   Vaping status:  Never Used  Substance and Sexual Activity   Alcohol use: No   Drug use: Yes    Types: Marijuana    Comment: Occasionally.   Sexual activity: Not Currently    Birth control/protection: Surgical    Comment: tubal  Other Topics Concern   Not on file  Social History Narrative   Are you right handed or left handed? right   Are you currently employed ? 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 no   Who lives with you?  family   What type of home do you live in: 1 story or 2 story?  2 story lives up stairs 15-20 steps       Social Drivers of Health   Financial Resource Strain: Medium Risk (05/16/2023)   Received from [facility]   Overall Financial Resource Strain (CARDIA)    Difficulty of Paying Living Expenses: Somewhat hard  Food Insecurity: Food Insecurity Present (05/24/2023)   Hunger Vital Sign    Worried About Running Out of Food in the Last Year: Sometimes true    Ran Out of Food in the Last Year: Often true  Transportation Needs: No Transportation Needs (05/24/2023)   PRAPARE - Administrator, Civil Service (Medical): No    Lack of Transportation (Non-Medical): No  Physical Activity: Insufficiently Active (07/16/2021)   Exercise Vital Sign    Days of Exercise per Week: 2 days    Minutes of Exercise per Session: 20 min  Stress: Stress Concern Present (07/16/2021)   [employer] of Occupational Health - Occupational Stress Questionnaire    Feeling of Stress : Very much  Social Connections: Socially Integrated (07/16/2021)   Social Connection and Isolation Panel    Frequency of Communication with Friends and Family: More than three times a week    Frequency of Social Gatherings with Friends and Family: More than three times a week    Attends Religious Services: More than 4 times per year    Active Member of [employer] or Organizations: Yes    Attends Banker Meetings: More than 4 times per year    Marital Status: Living  with partner    Allergies:  Allergies  Allergen Reactions   Toradol [Ketorolac Tromethamine] Hives and Itching   Adhesive [Tape] Itching and Rash    Current Medications: Current Outpatient Medications  Medication Sig Dispense Refill   acetaminophen  (TYLENOL ) 500 MG tablet Take 500 mg by mouth every 6 (six) hours as needed for moderate pain.     albuterol  (VENTOLIN  HFA) 108 (90 Base) MCG/ACT inhaler Inhale 2 puffs into the lungs every 6 (six) hours as needed for wheezing or shortness of breath. 8  g 3   ARIPiprazole  (ABILIFY ) 2 MG tablet Take 1 tablet (2 mg total) by mouth daily. 90 tablet 0   Aspirin-Acetaminophen -Caffeine (GOODY HEADACHE PO) Take by mouth.     budesonide -formoterol  (SYMBICORT ) 80-4.5 MCG/ACT inhaler Inhale 2 puffs into the lungs in the morning and at bedtime. Also uses as a rescue inhaler every 4 hours as needed for shortness of breath or wheezing 1 each 12   buprenorphine -naloxone  (SUBOXONE ) 8-2 mg SUBL SL tablet Place 1 tablet under the tongue 3 (three) times daily. 90 tablet 0   ferrous sulfate  325 (65 FE) MG tablet Take 1 tablet (325 mg total) by mouth daily with breakfast. Take after you finish your antibiotics 30 tablet 0   fluticasone  (FLONASE ) 50 MCG/ACT nasal spray SPRAY 1 SPRAY INTO BOTH NOSTRILS DAILY. 16 mL 1   gabapentin  (NEURONTIN ) 300 MG capsule TAKE 3 CAPSULES (900MG  TOTAL) BY MOUTH 3 TIMES DAILY 180 capsule 5   guaiFENesin -dextromethorphan  (ROBITUSSIN DM) 100-10 MG/5ML syrup Take 5 mLs by mouth every 4 (four) hours as needed for cough. 118 mL 0   ipratropium (ATROVENT ) 0.02 % nebulizer solution Take 2.5 mLs (0.5 mg total) by nebulization every 6 (six) hours as needed for wheezing or shortness of breath. 125 mL 3   lamoTRIgine  (LAMICTAL ) 100 MG tablet Take 1 tablet (100 mg total) by mouth daily. 90 tablet 0   methocarbamol  (ROBAXIN ) 500 MG tablet Take 1 tablet (500 mg total) by mouth 2 (two) times daily as needed for muscle spasms. 20 tablet 0   naloxone   (NARCAN ) nasal spray 4 mg/0.1 mL SMARTSIG:Both Nares     pantoprazole  (PROTONIX ) 40 MG tablet Take 1 tablet (40 mg total) by mouth daily. 90 tablet 3   polyethylene glycol powder (GLYCOLAX /MIRALAX ) 17 GM/SCOOP powder 1-4 scoops daily or as needed 255 g 11   venlafaxine  XR (EFFEXOR -XR) 75 MG 24 hr capsule Take 3 capsules (225 mg total) by mouth daily with breakfast. 90 capsule 2   No current facility-administered medications for this visit.     Psychiatric Specialty Exam: Review of Systems  There were no vitals taken for this visit.There is no height or weight on file to calculate BMI.  General Appearance: Casual  Eye Contact:  Fair  Speech:  Normal Rate  Volume:  Normal  Mood:  Anxious and Depressed  Affect:  Appropriate, Congruent, and Tearful  Thought Process:  Coherent  Orientation:  Full (Time, Place, and Person)  Thought Content: Logical   Suicidal Thoughts:  Yes.  without intent/plan  Homicidal Thoughts:  No  Memory:  Immediate;   Fair  Judgement:  Fair  Insight:  Fair  Psychomotor Activity:  Decreased  Concentration:  Concentration: Fair  Recall:  [employer] of Knowledge: Fair  Language: Good  Akathisia:  No    AIMS (if indicated): not done  Assets:  Communication Skills Desire for Improvement Housing Intimacy  ADL's:  Intact  Cognition: WNL  Sleep:  Fair   Metabolic Disorder Labs: Lab Results  Component Value Date   HGBA1C 5.0 08/21/2020   No results found for: PROLACTIN No results found for: CHOL, TRIG, HDL, CHOLHDL, VLDL, LDLCALC Lab Results  Component Value Date   TSH 0.940 04/28/2022   TSH 0.646 07/03/2021    Therapeutic Level Labs: No results found for: LITHIUM No results found for: VALPROATE No results found for: CBMZ   Screenings: GAD-7    Flowsheet Row Office Visit from 08/22/2022 in [facility] - A Dept Of [facility]  [facility] Office [facility] Visit from 07/16/2021 in [facility] for [facility] Office Visit from 07/03/2021 in [facility] Internal [facility] - A Dept Of [place]. [facility] Office Visit from 12/18/2020 in [facility] - A Dept Of [place]. [facility]  Total GAD-7 Score 21 19 20 19 8    PHQ2-9    Flowsheet Row Office Visit from 08/22/2022 in [facility] - A Dept Of [place]. [facility] Office Visit from 07/29/2022 in [facility] Office Visit from 07/22/2022 in [facility] - A Dept Of [place]. [facility] Office Visit from 06/20/2022 in [facility]tr - A Dept Of [place]. [facility] Office Visit from 04/14/2022 in [facility] Internal Med [facility]tr - A Dept Of [place]. [facility]  PHQ-2 Total Score 5 6 6 5 4   PHQ-9 Total Score 22 23 24 20 19    Flowsheet Row ED from 08/03/2023 in [facility] Emergency Department at [facility] ED to Hosp-Admission (Discharged) from 05/23/2023 in [facility]ICAL Office Visit from 07[facility]29/2022 [facility]  C-SSRS RISK CATEGORY No Risk No Risk Low Risk    Collaboration of Care: Collaboration of Care: Medication Management AEB medication prescription and Other provider involved in patient's care AEB PCP and ED chart review  Patient/Guardian was advised Release of Information must be obtained prior to any record release in order to collaborate their care with an outside provider. Patient/Guardian was advised if they have not already done so to contact the registration department to sign all necessary forms in order for us  to release information regarding their care.   Consent: Patient/Guardian gives verbal consent for treatment and assignment of benefits for services provided during this visit. Patient/Guardian  expressed understanding and agreed to proceed.    Arvella CHRISTELLA Finder, MD 09/29/2023, 1:39 PM   Virtual Visit via Video Note  I connected with Reighan Spike on 09/29/23 at  1:00 PM EDT by a video enabled telemedicine application and verified that I am speaking with the correct person using two identifiers.  Location: Patient: Home Provider: Home Office   I discussed the limitations of evaluation and management by telemedicine and the availability of in person appointments. The patient expressed understanding and agreed to proceed.   I discussed the assessment and treatment plan with the patient. The patient was provided an opportunity to ask questions and all were answered. The patient agreed with the plan and demonstrated an understanding of the instructions.   The patient was advised to call back or seek an in-person evaluation if the symptoms worsen or if the condition fails to improve as anticipated.  I provided 35 minutes of non-face-to-face time during this encounter.   Arvella CHRISTELLA Finder, MD

## 2023-09-29 ENCOUNTER — Telehealth (HOSPITAL_BASED_OUTPATIENT_CLINIC_OR_DEPARTMENT_OTHER): Admitting: Psychiatry

## 2023-09-29 ENCOUNTER — Encounter (HOSPITAL_COMMUNITY): Payer: Self-pay | Admitting: Psychiatry

## 2023-09-29 DIAGNOSIS — F411 Generalized anxiety disorder: Secondary | ICD-10-CM | POA: Diagnosis not present

## 2023-09-29 DIAGNOSIS — F3181 Bipolar II disorder: Secondary | ICD-10-CM | POA: Diagnosis not present

## 2023-09-29 MED ORDER — VENLAFAXINE HCL ER 75 MG PO CP24: 225.0000 mg | ORAL_CAPSULE | Freq: Every day | ORAL | 2 refills | Status: DC

## 2023-09-29 MED ORDER — ARIPIPRAZOLE 2 MG PO TABS: 2.0000 mg | ORAL_TABLET | Freq: Every day | ORAL | 0 refills | Status: DC

## 2023-09-29 MED ORDER — LAMOTRIGINE 100 MG PO TABS: 100.0000 mg | ORAL_TABLET | Freq: Every day | ORAL | 0 refills | Status: DC

## 2023-10-05 ENCOUNTER — Telehealth (HOSPITAL_COMMUNITY): Payer: Self-pay | Admitting: Professional

## 2023-10-05 NOTE — Telephone Encounter (Signed)
 See call log

## 2023-10-08 ENCOUNTER — Other Ambulatory Visit: Payer: Self-pay

## 2023-10-08 ENCOUNTER — Other Ambulatory Visit (HOSPITAL_COMMUNITY): Payer: Self-pay | Admitting: Psychiatry

## 2023-10-08 DIAGNOSIS — F411 Generalized anxiety disorder: Secondary | ICD-10-CM

## 2023-10-08 DIAGNOSIS — F3181 Bipolar II disorder: Secondary | ICD-10-CM

## 2023-10-09 DIAGNOSIS — G629 Polyneuropathy, unspecified: Secondary | ICD-10-CM | POA: Diagnosis not present

## 2023-10-09 DIAGNOSIS — J45909 Unspecified asthma, uncomplicated: Secondary | ICD-10-CM | POA: Diagnosis not present

## 2023-10-09 DIAGNOSIS — G935 Compression of brain: Secondary | ICD-10-CM | POA: Diagnosis not present

## 2023-10-09 DIAGNOSIS — K631 Perforation of intestine (nontraumatic): Secondary | ICD-10-CM | POA: Diagnosis not present

## 2023-10-12 ENCOUNTER — Other Ambulatory Visit: Payer: Self-pay

## 2023-10-12 DIAGNOSIS — J4530 Mild persistent asthma, uncomplicated: Secondary | ICD-10-CM

## 2023-10-12 DIAGNOSIS — K631 Perforation of intestine (nontraumatic): Secondary | ICD-10-CM | POA: Diagnosis not present

## 2023-10-12 DIAGNOSIS — J45909 Unspecified asthma, uncomplicated: Secondary | ICD-10-CM | POA: Diagnosis not present

## 2023-10-12 DIAGNOSIS — Z933 Colostomy status: Secondary | ICD-10-CM | POA: Diagnosis not present

## 2023-10-12 DIAGNOSIS — G935 Compression of brain: Secondary | ICD-10-CM | POA: Diagnosis not present

## 2023-10-12 DIAGNOSIS — G629 Polyneuropathy, unspecified: Secondary | ICD-10-CM | POA: Diagnosis not present

## 2023-10-12 MED ORDER — FLUTICASONE PROPIONATE 50 MCG/ACT NA SUSP: 1.0000 | Freq: Every day | NASAL | 11 refills | Status: DC

## 2023-10-12 NOTE — Telephone Encounter (Signed)
 Rx will need to be sent by a different provider ([place],Juberg,Patel,Koomson)

## 2023-10-13 ENCOUNTER — Ambulatory Visit
Admission: RE | Admit: 2023-10-13 | Discharge: 2023-10-13 | Disposition: A | Discharge status: Home / Self Care | Source: Ambulatory Visit | Attending: Internal Medicine | Admitting: Internal Medicine

## 2023-10-13 DIAGNOSIS — Z1231 Encounter for screening mammogram for malignant neoplasm of breast: Secondary | ICD-10-CM | POA: Diagnosis not present

## 2023-10-13 DIAGNOSIS — Z129 Encounter for screening for malignant neoplasm, site unspecified: Secondary | ICD-10-CM

## 2023-10-19 ENCOUNTER — Other Ambulatory Visit: Payer: Self-pay

## 2023-10-19 DIAGNOSIS — J4530 Mild persistent asthma, uncomplicated: Secondary | ICD-10-CM

## 2023-10-19 DIAGNOSIS — G6289 Other specified polyneuropathies: Secondary | ICD-10-CM

## 2023-10-19 DIAGNOSIS — J069 Acute upper respiratory infection, unspecified: Secondary | ICD-10-CM

## 2023-10-21 MED ORDER — GABAPENTIN 300 MG PO CAPS: ORAL_CAPSULE | ORAL | 5 refills | Status: DC

## 2023-10-21 MED ORDER — IPRATROPIUM BROMIDE 0.02 % IN SOLN
0.5000 mg | Freq: Four times a day (QID) | RESPIRATORY_TRACT | 3 refills | Status: AC | PRN
Start: 2023-10-21 — End: ?

## 2023-10-29 ENCOUNTER — Other Ambulatory Visit: Payer: Self-pay

## 2023-10-29 DIAGNOSIS — F1190 Opioid use, unspecified, uncomplicated: Secondary | ICD-10-CM

## 2023-10-29 MED ORDER — BUPRENORPHINE HCL-NALOXONE HCL 8-2 MG SL SUBL: 1.0000 | SUBLINGUAL_TABLET | Freq: Three times a day (TID) | SUBLINGUAL | 0 refills | Status: DC

## 2023-10-29 NOTE — Telephone Encounter (Signed)
 Copied from CRM #[id number]. Topic: Clinical - Medication Refill >> Oct 29, 2023  1:01 PM Vivian Z wrote: Medication: buprenorphine -naloxone  (SUBOXONE ) 8-2 mg SUBL SL tablet  Patient mentioned that pharmacy sent refill request a few days ago.   Has the patient contacted their pharmacy? Yes (Agent: If no, request that the patient contact the pharmacy for the refill. If patient does not wish to contact the pharmacy document the reason why and proceed with request.) (Agent: If yes, when and what did the pharmacy advise?)  This is the patient's preferred pharmacy:  [id number] - [place], [place] - [address] AT [facility] [address] [place] [address] Phone: [phone] Fax: [phone]  Is this the correct pharmacy for this prescription? Yes If no, delete pharmacy and type the correct one.   Has the prescription been filled recently? No  Is the patient out of the medication? Yes  Has the patient been seen for an appointment in the last year OR does the patient have an upcoming appointment? Yes  Can we respond through MyChart? No  Agent: Please be advised that Rx refills may take up to 3 business days. We ask that you follow-up with your pharmacy.

## 2023-11-05 ENCOUNTER — Encounter (INDEPENDENT_AMBULATORY_CARE_PROVIDER_SITE_OTHER): Payer: Self-pay

## 2023-11-06 ENCOUNTER — Telehealth (HOSPITAL_COMMUNITY): Payer: Self-pay | Admitting: Licensed Clinical Social Worker

## 2023-11-06 NOTE — Telephone Encounter (Signed)
 See call log

## 2023-11-09 NOTE — Progress Notes (Unsigned)
 BH MD/PA/NP OP Progress Note  11/12/2023 4:31 PM Barbara Simon  MRN:  [id number]  Visit Diagnosis:    ICD-10-CM   1. Bipolar 2 disorder (HCC)  F31.81 lamoTRIgine  (LAMICTAL ) 100 MG tablet    ARIPiprazole  (ABILIFY ) 2 MG tablet    venlafaxine  XR (EFFEXOR -XR) 75 MG 24 hr capsule    2. GAD (generalized anxiety disorder)  F41.1 lamoTRIgine  (LAMICTAL ) 100 MG tablet    ARIPiprazole  (ABILIFY ) 2 MG tablet    venlafaxine  XR (EFFEXOR -XR) 75 MG 24 hr capsule      Assessment:  Barbara Simon is a 48 y.o. female with a history of bipolar disorder 2 disorder, opioid use disorder on suboxone  maintenance, psychogenic seizures, GAD, and asthma who presented to [facility] for initial evaluation on 07/29/2022.  During initial evaluation patient reported symptoms of insomnia, irritability, mood lability, dysphoria, fatigue, weight loss, amotivation, and passive SI without intent or plan.  Safety planning was reviewed and patient is aware of crisis resources.  She does have a past history of hypomania including decreased sleep and increased energy outside of the context of substance use.  Patient does have a significant past history of substance use including cocaine, opiates, marijuana and benzodiazepines.  She denies any cocaine use and opiate use disorder is stable on Suboxone .  Patient does use marijuana regularly and benzodiazepines infrequently.  Education was provided and patient was recommended to discontinue the benzodiazepine and marijuana use.  Patient also is experiencing seizure-like symptoms including tremors, rigidity, disorientation, and intermittent loss of consciousness without associated tongue biting or loss of bowel/bladder control.  Patient meets criteria for bipolar 2 disorder and generalized anxiety disorder.  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    Barbara Simon presents for follow-up  evaluation. Today, 11/12/23, patient reports    continued neurovegetative symptoms of depression and anxiety.  Anxiety symptoms had a transient increase with return of pseudoseizures in the interim.  This correlated with patient attempted to return to work and had been unable to keep up.  There has been slight improvement in mood lability in the interim.  Patient tolerated the increase venlafaxine  well denying any adverse side effects.  She continues to take her medications consistently.  Did review the urine screen the patient admitted to methamphetamine use.  Notes that she had used it in attempt to numb herself.  Patient is interested in maintaining sobriety moving forward and is aware that it will only make her mental health symptoms worse.  Furthermore some of the ongoing interpersonal conflict is related to the suspected substance use and continuing that would only exacerbate the interpersonal conflict with her kids.  We will continue on current regimen and follow up in 6 weeks.  Patient was also referred to [facility].  Psychotherapeutic interventions were used during today's session. From 3:41 PM to 3:59 PM. Therapeutic interventions included empathic listening, supportive therapy, cognitive and behavioral therapy, motivational interviewing. Used supportive interviewing techniques to provide emotional validation. Worked on cognitive reframing techniques and recommendations made for behavioral activation.  Improvement was evidenced by patient's participation and identified commitment to therapy goals.   Plan: - Continue Lamictal  100 mg daily for bipolar disorder - Continue Venlafaxine  225 mg daily - Continue Abilify  2 mg QD for bipolar disorder - Can restart trazodone  25 mg QHS in the future if finances improve - Continue Suboxone  8-2mg  films every day-BID (patient self tapering) managed by PCP - CMP, CBC,  alkaline phosphatase, TSH, T4 reviewed - Continue to follow with neurology - PHP referral, lacks  transportation for in person - Therapy resources provided - Crisis resources reviewed - Follow up in a month  Chief Complaint:  Chief Complaint  Patient presents with  . Follow-up   HPI: Barbara Simon presents reporting that she is doing. The stressors have been the same, with the addition of her husband missing court last week. They had no idea he had been scheduled for a date possibly due to having no set address. They currently are going back and forth between 2 places. Luckily he was not taken back to jail for that and they have been able to get him an attorney. This makes her say that today is a good day, but she is not sure what she will do if he goes back to jail.  Colostomy bag has been difficult as it is coming off whenever she moves. Does have the coloscopy next week, but has anxiety on what if something happens during the procedure or the eventual surgery for when she get the colonoscopy. Especially since she had the bag    Positive is she has been allowed to go to her grandsons birthday party. His mother is going pick them up and take Barbara Simon to the beach where they will celebrate it. Older 2 kids still not talking to her, but they have texted her once which gives her some hope that things may eventually approve. Still a lot of sadness that she has not been able to see her new grandson   Still sober from everything other then marijuana, but has struggled with thoughts and cravings to relapse on methamphetamine.     things are still pretty bad.  She is endorsing low mood, anhedonia, amotivation, decreased appetite, increased insomnia, negative self thoughts, feelings of hopelessness.  She denies any SI at this time.  The 1 positive that she identified from the past month is that her moods are slightly less labile.  She tolerated the venlafaxine  increase and denied any adverse side effects.  Patient endorses the ongoing psychosocial stressors including current homelessness, ongoing  interpersonal conflict with her kids, body image concerns, and still dealing with the colostomy bag.  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  She had been unable to sustain and however is her anxiety increased and the seizure-like episodes returned once she started working again.  Patient reported feeling worthless on her inability to support herself or her husband.  She has filed for disability now.  We discussed her urine toxicology that was positive for methamphetamine back in the beginning of June.  Patient admitted that she had used shortly prior to that and had not disclosed that this provider due to embarrassment.  She reports using the methamphetamine as a way to numb herself to try and escape the anguish of her current situation.  She reports having used couple times since then though only once after her appointment last month.  We discussed the negative impacts with substance use and how it is only going to make her psychosocial stressors worse in addition to her mood.  Patient acknowledged this and reports being aware that to get back towards maintaining her sobriety.  Had mentioned substance use programs as well as CDIOP however patient did not feel like these were necessary at this time.  Furthermore she does not have the ability to attend an in person program.  Did recommend the  PHP program which patient had declined previously due to concern of losing phone.  She was able to keep her phone active and is open to giving PHP a try.  Past Psychiatric History: Information collected from pt, medical record Prior hx bipolar II. Has had hypomanic but not manic episodes outside of periods of substance abuse.  Patient's manic episodes appear to only occurred in context of substance use.  Patient has 2 prior psychiatric hospitalizations in 2005 and 2010 to [facility].  Has tried Lamictal , Depakote, Seroquel , Abilify , Celexa , Lexapro , Cymbalta, Remeron, trazodone ,  Xanax  Sober on buprenorphine  from opiates. Uses marijuana, home grown. She takes a couple Xanax from her mom when she is overwhelmed. Counseling was provided in regards to both. She denies   Past Medical History:  Past Medical History:  Diagnosis Date  . Abnormal Pap smear    LSIL  . Anxiety   . Asthma   . Bipolar 1 disorder (HCC)   . Depression   . Hepatitis C    two years ago was dx  . Ovarian cyst   . Seizure (HCC)   . Thyrotoxicosis     Past Surgical History:  Procedure Laterality Date  . ABDOMINAL SURGERY    . APPENDECTOMY    . TUBAL LIGATION      Family History:  Family History  Problem Relation Age of Onset  . Cancer Mother        bladder  . Diabetes Mother   . Diabetes Sister   . Cerebral palsy Daughter   . Breast cancer Maternal Aunt 30 - 39  . Cancer Maternal Aunt   . Breast cancer Maternal Grandmother   . Cancer Maternal Grandmother   . Diabetes Brother     Social History:  Social History   Socioeconomic History  . Marital status: Married    Spouse name: Not on file  . Number of children: Not on file  . Years of education: Not on file  . Highest education level: Not on file  Occupational History  . Not on file  Tobacco Use  . Smoking status: Former    Current packs/day: 0.50    Types: Cigarettes  . Smokeless tobacco: Never  . Tobacco comments:    1 pack lasts 2 days   Vaping Use  . Vaping status: Never Used  Substance and Sexual Activity  . Alcohol use: No  . Drug use: Yes    Types: Marijuana    Comment: Occasionally.  . Sexual activity: Not Currently    Birth control/protection: Surgical    Comment: tubal  Other Topics Concern  . Not on file  Social History Narrative   Are you right handed or left handed? right   Are you currently employed ? 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 no   Who lives with you?  family   What type of home do you live in: 1 story or 2 story?  2 story lives up  stairs 15-20 steps       Social Drivers of Health   Financial Resource Strain: Medium Risk (05/16/2023)   Received from [facility]   Overall Financial Resource Strain (CARDIA)   . Difficulty of Paying Living Expenses: Somewhat hard  Food Insecurity: Food Insecurity Present (05/24/2023)   Hunger Vital Sign   . Worried About Programme researcher, broadcasting/film/video in the Last Year: Sometimes true   . Ran Out of Food in the Last  Year: Often true  Transportation Needs: No Transportation Needs (05/24/2023)   PRAPARE - Transportation   . Lack of Transportation (Medical): No   . Lack of Transportation (Non-Medical): No  Physical Activity: Insufficiently Active (07/16/2021)   Exercise Vital Sign   . Days of Exercise per Week: 2 days   . Minutes of Exercise per Session: 20 min  Stress: Stress Concern Present (07/16/2021)   [employer] of Occupational Health - Occupational Stress Questionnaire   . Feeling of Stress : Very much  Social Connections: Socially Integrated (07/16/2021)   Social Connection and Isolation Panel   . Frequency of Communication with Friends and Family: More than three times a week   . Frequency of Social Gatherings with Friends and Family: More than three times a week   . Attends Religious Services: More than 4 times per year   . Active Member of Clubs or Organizations: Yes   . Attends Banker Meetings: More than 4 times per year   . Marital Status: Living with partner    Allergies:  Allergies  Allergen Reactions  . Toradol [Ketorolac Tromethamine] Hives and Itching  . Adhesive [Tape] Itching and Rash    Current Medications: Current Outpatient Medications  Medication Sig Dispense Refill  . acetaminophen  (TYLENOL ) 500 MG tablet Take 500 mg by mouth every 6 (six) hours as needed for moderate pain.    . albuterol  (VENTOLIN  HFA) 108 (90 Base) MCG/ACT inhaler Inhale 2 puffs into the lungs every 6 (six) hours as needed for wheezing or shortness of breath. 8 g 3  .  ARIPiprazole  (ABILIFY ) 2 MG tablet Take 1 tablet (2 mg total) by mouth daily. 90 tablet 0  . Aspirin-Acetaminophen -Caffeine (GOODY HEADACHE PO) Take by mouth.    . budesonide -formoterol  (SYMBICORT ) 80-4.5 MCG/ACT inhaler Inhale 2 puffs into the lungs in the morning and at bedtime. Also uses as a rescue inhaler every 4 hours as needed for shortness of breath or wheezing 1 each 12  . buprenorphine -naloxone  (SUBOXONE ) 8-2 mg SUBL SL tablet Place 1 tablet under the tongue 3 (three) times daily. 90 tablet 0  . ferrous sulfate  325 (65 FE) MG tablet Take 1 tablet (325 mg total) by mouth daily with breakfast. Take after you finish your antibiotics 30 tablet 0  . fluticasone  (FLONASE ) 50 MCG/ACT nasal spray PLACE 1 SPRAY INTO BOTH NOSTRILS DAILY. SPRAY 1 SPRAY INTO BOTH NOSTRILS DAILY. 16 mL 11  . gabapentin  (NEURONTIN ) 300 MG capsule TAKE 3 CAPSULES (900MG  TOTAL) BY MOUTH 3 TIMES DAILY 180 capsule 5  . guaiFENesin -dextromethorphan  (ROBITUSSIN DM) 100-10 MG/5ML syrup Take 5 mLs by mouth every 4 (four) hours as needed for cough. 118 mL 0  . ipratropium (ATROVENT ) 0.02 % nebulizer solution Take 2.5 mLs (0.5 mg total) by nebulization every 6 (six) hours as needed for wheezing or shortness of breath. 125 mL 3  . lamoTRIgine  (LAMICTAL ) 100 MG tablet Take 1 tablet (100 mg total) by mouth daily. 90 tablet 0  . methocarbamol  (ROBAXIN ) 500 MG tablet Take 1 tablet (500 mg total) by mouth 2 (two) times daily as needed for muscle spasms. 20 tablet 0  . naloxone  (NARCAN ) nasal spray 4 mg/0.1 mL SMARTSIG:Both Nares    . pantoprazole  (PROTONIX ) 40 MG tablet Take 1 tablet (40 mg total) by mouth daily. 90 tablet 3  . polyethylene glycol powder (GLYCOLAX /MIRALAX ) 17 GM/SCOOP powder 1-4 scoops daily or as needed 255 g 11  . venlafaxine  XR (EFFEXOR -XR) 75 MG 24 hr capsule Take 3 capsules (225  mg total) by mouth daily with breakfast. 90 capsule 2   No current facility-administered medications for this visit.     Psychiatric  Specialty Exam: Review of Systems  There were no vitals taken for this visit.There is no height or weight on file to calculate BMI.  General Appearance: Casual  Eye Contact:  Fair  Speech:  Normal Rate  Volume:  Normal  Mood:  Anxious and Depressed  Affect:  Appropriate and Congruent  Thought Process:  Coherent  Orientation:  Full (Time, Place, and Person)  Thought Content: Logical   Suicidal Thoughts:  Yes.  without intent/plan  Homicidal Thoughts:  No  Memory:  Immediate;   Fair  Judgement:  Fair  Insight:  Fair  Psychomotor Activity:  Decreased  Concentration:  Concentration: Fair  Recall:  [employer] of Knowledge: Fair  Language: Good  Akathisia:  No    AIMS (if indicated): not done  Assets:  Communication Skills Desire for Improvement Housing Intimacy  ADL's:  Intact  Cognition: WNL  Sleep:  Fair   Metabolic Disorder Labs: Lab Results  Component Value Date   HGBA1C 5.0 08/21/2020   No results found for: PROLACTIN No results found for: CHOL, TRIG, HDL, CHOLHDL, VLDL, LDLCALC Lab Results  Component Value Date   TSH 0.940 04/28/2022   TSH 0.646 07/03/2021    Therapeutic Level Labs: No results found for: LITHIUM No results found for: VALPROATE No results found for: CBMZ   Screenings: GAD-7    Flowsheet Row Office Visit from 08/22/2022 in [facility] - A Dept Of [place]. [facility] Office Visit from 07/29/2022 in [facility] Office Visit from 07/16/2021 in [facility] for [facility] at [facility] Office Visit from 07/03/2021 in [facility] - A Dept Of [place]. [facility] Office Visit from 12/18/2020 in [facility] - A Dept Of [place]. [facility]  Total GAD-7 Score 21 19 20 19 8    PHQ2-9    Flowsheet Row Office Visit from 08/22/2022 in [facility] - A Dept Of [place]. [facility]  Office Visit from 07/29/2022 in [facility] Office Visit from 07/22/2022 in [facility] - A Dept Of [place]. [facility] Office Visit from 06/20/2022 in [facility] - A Dept Of [place]. [facility] Office Visit from 04/14/2022 in [facility] - A Dept Of [place]. [facility]  PHQ-2 Total Score 5 6 6 5 4   PHQ-9 Total Score 22 23 24 20 19    Flowsheet Row ED from 08/03/2023 in [facility] Emergency Department at [facility] ED to Hosp-Admission (Discharged) from 05/23/2023 in [facility]  SUR[facility]ICAL [facility]  C-SSRS RISK CATEGORY No Risk No Risk Low Risk    Collaboration of Care: Collaboration of Care: Medication Management AEB medication prescription and Other provider involved in patient's care AEB PCP and ED chart review  Patient/Guardian was advised Release of Information must be obtained prior to any record release in order to collaborate their care with an outside provider. Patient/Guardian was advised if they have not already done so to contact the registration department to sign all necessary forms in order for us  to release information regarding their care.   Consent: Patient/Guardian gives  verbal consent for treatment and assignment of benefits for services provided during this visit. Patient/Guardian expressed understanding and agreed to proceed.    Arvella CHRISTELLA Finder, MD 11/12/2023, 4:31 PM   Virtual Visit via Video Note  I connected with Rozella Wisler on 11/12/23 at  3:30 PM EDT by a video enabled telemedicine application and verified that I am speaking with the correct person using two identifiers.  Location: Patient: Home Provider: Home Office   I discussed the limitations of evaluation and management by telemedicine and the availability of in person appointments. The  patient expressed understanding and agreed to proceed.   I discussed the assessment and treatment plan with the patient. The patient was provided an opportunity to ask questions and all were answered. The patient agreed with the plan and demonstrated an understanding of the instructions.   The patient was advised to call back or seek an in-person evaluation if the symptoms worsen or if the condition fails to improve as anticipated.  I provided 25 minutes of non-face-to-face time during this encounter.   Arvella CHRISTELLA Finder, MD

## 2023-11-11 ENCOUNTER — Telehealth (HOSPITAL_COMMUNITY): Payer: Self-pay | Admitting: Professional

## 2023-11-11 DIAGNOSIS — G935 Compression of brain: Secondary | ICD-10-CM | POA: Diagnosis not present

## 2023-11-11 DIAGNOSIS — G629 Polyneuropathy, unspecified: Secondary | ICD-10-CM | POA: Diagnosis not present

## 2023-11-11 DIAGNOSIS — Z933 Colostomy status: Secondary | ICD-10-CM | POA: Diagnosis not present

## 2023-11-11 DIAGNOSIS — K631 Perforation of intestine (nontraumatic): Secondary | ICD-10-CM | POA: Diagnosis not present

## 2023-11-11 DIAGNOSIS — J45909 Unspecified asthma, uncomplicated: Secondary | ICD-10-CM | POA: Diagnosis not present

## 2023-11-11 NOTE — Telephone Encounter (Signed)
 See call log

## 2023-11-12 ENCOUNTER — Telehealth (HOSPITAL_COMMUNITY): Admitting: Psychiatry

## 2023-11-12 ENCOUNTER — Encounter (HOSPITAL_COMMUNITY): Payer: Self-pay | Admitting: Psychiatry

## 2023-11-12 DIAGNOSIS — F3181 Bipolar II disorder: Secondary | ICD-10-CM

## 2023-11-12 DIAGNOSIS — F411 Generalized anxiety disorder: Secondary | ICD-10-CM | POA: Diagnosis not present

## 2023-11-12 MED ORDER — LAMOTRIGINE 100 MG PO TABS: 100.0000 mg | ORAL_TABLET | Freq: Every day | ORAL | 0 refills | Status: DC

## 2023-11-12 MED ORDER — VENLAFAXINE HCL ER 75 MG PO CP24: 225.0000 mg | ORAL_CAPSULE | Freq: Every day | ORAL | 2 refills | Status: DC

## 2023-11-12 MED ORDER — ARIPIPRAZOLE 2 MG PO TABS
2.0000 mg | ORAL_TABLET | Freq: Every day | ORAL | 0 refills | Status: DC
Start: 2023-11-12 — End: 2023-12-31

## 2023-11-20 DIAGNOSIS — K769 Liver disease, unspecified: Secondary | ICD-10-CM | POA: Diagnosis not present

## 2023-11-20 DIAGNOSIS — Z7951 Long term (current) use of inhaled steroids: Secondary | ICD-10-CM | POA: Diagnosis not present

## 2023-11-20 DIAGNOSIS — Z9049 Acquired absence of other specified parts of digestive tract: Secondary | ICD-10-CM | POA: Diagnosis not present

## 2023-11-20 DIAGNOSIS — G4089 Other seizures: Secondary | ICD-10-CM | POA: Diagnosis not present

## 2023-11-20 DIAGNOSIS — N951 Menopausal and female climacteric states: Secondary | ICD-10-CM | POA: Diagnosis not present

## 2023-11-20 DIAGNOSIS — B1920 Unspecified viral hepatitis C without hepatic coma: Secondary | ICD-10-CM | POA: Diagnosis not present

## 2023-11-20 DIAGNOSIS — K9419 Other complications of enterostomy: Secondary | ICD-10-CM | POA: Diagnosis not present

## 2023-11-20 DIAGNOSIS — Z79891 Long term (current) use of opiate analgesic: Secondary | ICD-10-CM | POA: Diagnosis not present

## 2023-11-20 DIAGNOSIS — Z9851 Tubal ligation status: Secondary | ICD-10-CM | POA: Diagnosis not present

## 2023-11-20 DIAGNOSIS — Z1211 Encounter for screening for malignant neoplasm of colon: Secondary | ICD-10-CM | POA: Diagnosis not present

## 2023-11-20 DIAGNOSIS — Z87891 Personal history of nicotine dependence: Secondary | ICD-10-CM | POA: Diagnosis not present

## 2023-11-20 DIAGNOSIS — Z860100 Personal history of colon polyps, unspecified: Secondary | ICD-10-CM | POA: Diagnosis not present

## 2023-11-20 DIAGNOSIS — J45909 Unspecified asthma, uncomplicated: Secondary | ICD-10-CM | POA: Diagnosis not present

## 2023-11-20 DIAGNOSIS — Z79899 Other long term (current) drug therapy: Secondary | ICD-10-CM | POA: Diagnosis not present

## 2023-11-20 DIAGNOSIS — K635 Polyp of colon: Secondary | ICD-10-CM | POA: Diagnosis not present

## 2023-11-20 DIAGNOSIS — D124 Benign neoplasm of descending colon: Secondary | ICD-10-CM | POA: Diagnosis not present

## 2023-11-20 DIAGNOSIS — Z886 Allergy status to analgesic agent status: Secondary | ICD-10-CM | POA: Diagnosis not present

## 2023-11-20 DIAGNOSIS — Z7982 Long term (current) use of aspirin: Secondary | ICD-10-CM | POA: Diagnosis not present

## 2023-11-20 DIAGNOSIS — Z8719 Personal history of other diseases of the digestive system: Secondary | ICD-10-CM | POA: Diagnosis not present

## 2023-11-20 DIAGNOSIS — Z01818 Encounter for other preprocedural examination: Secondary | ICD-10-CM | POA: Diagnosis not present

## 2023-11-20 DIAGNOSIS — Z933 Colostomy status: Secondary | ICD-10-CM | POA: Diagnosis not present

## 2023-12-14 DIAGNOSIS — G935 Compression of brain: Secondary | ICD-10-CM | POA: Diagnosis not present

## 2023-12-14 DIAGNOSIS — K631 Perforation of intestine (nontraumatic): Secondary | ICD-10-CM | POA: Diagnosis not present

## 2023-12-14 DIAGNOSIS — Z933 Colostomy status: Secondary | ICD-10-CM | POA: Diagnosis not present

## 2023-12-14 DIAGNOSIS — J45909 Unspecified asthma, uncomplicated: Secondary | ICD-10-CM | POA: Diagnosis not present

## 2023-12-14 DIAGNOSIS — G629 Polyneuropathy, unspecified: Secondary | ICD-10-CM | POA: Diagnosis not present

## 2023-12-17 DIAGNOSIS — K9419 Other complications of enterostomy: Secondary | ICD-10-CM | POA: Diagnosis not present

## 2023-12-17 DIAGNOSIS — Z5321 Procedure and treatment not carried out due to patient leaving prior to being seen by health care provider: Secondary | ICD-10-CM | POA: Diagnosis not present

## 2023-12-17 DIAGNOSIS — R509 Fever, unspecified: Secondary | ICD-10-CM | POA: Diagnosis not present

## 2023-12-17 DIAGNOSIS — R109 Unspecified abdominal pain: Secondary | ICD-10-CM | POA: Diagnosis not present

## 2023-12-17 DIAGNOSIS — Z043 Encounter for examination and observation following other accident: Secondary | ICD-10-CM | POA: Diagnosis not present

## 2023-12-17 DIAGNOSIS — R1012 Left upper quadrant pain: Secondary | ICD-10-CM | POA: Diagnosis not present

## 2023-12-17 DIAGNOSIS — J45909 Unspecified asthma, uncomplicated: Secondary | ICD-10-CM | POA: Diagnosis not present

## 2023-12-17 DIAGNOSIS — Z87891 Personal history of nicotine dependence: Secondary | ICD-10-CM | POA: Diagnosis not present

## 2023-12-17 DIAGNOSIS — R110 Nausea: Secondary | ICD-10-CM | POA: Diagnosis not present

## 2023-12-19 DIAGNOSIS — F1510 Other stimulant abuse, uncomplicated: Secondary | ICD-10-CM | POA: Diagnosis not present

## 2023-12-19 DIAGNOSIS — F1120 Opioid dependence, uncomplicated: Secondary | ICD-10-CM | POA: Diagnosis not present

## 2023-12-19 DIAGNOSIS — R1032 Left lower quadrant pain: Secondary | ICD-10-CM | POA: Diagnosis not present

## 2023-12-19 DIAGNOSIS — Z743 Need for continuous supervision: Secondary | ICD-10-CM | POA: Diagnosis not present

## 2023-12-19 DIAGNOSIS — G8929 Other chronic pain: Secondary | ICD-10-CM | POA: Diagnosis not present

## 2023-12-19 DIAGNOSIS — R569 Unspecified convulsions: Secondary | ICD-10-CM | POA: Diagnosis not present

## 2023-12-19 DIAGNOSIS — R1012 Left upper quadrant pain: Secondary | ICD-10-CM | POA: Diagnosis not present

## 2023-12-20 DIAGNOSIS — R1012 Left upper quadrant pain: Secondary | ICD-10-CM | POA: Diagnosis not present

## 2023-12-21 ENCOUNTER — Other Ambulatory Visit: Payer: Self-pay

## 2023-12-21 DIAGNOSIS — G6289 Other specified polyneuropathies: Secondary | ICD-10-CM

## 2023-12-21 DIAGNOSIS — F1190 Opioid use, unspecified, uncomplicated: Secondary | ICD-10-CM

## 2023-12-21 NOTE — Telephone Encounter (Unsigned)
 Copied from CRM [id number]. Topic: Clinical - Medication Refill >> Dec 21, 2023  3:19 PM Shamecia H wrote: Medication:  buprenorphine -naloxone  (SUBOXONE ) 8-2 mg SUBL SL tablet ,  gabapentin  (NEURONTIN ) 300 MG capsule  Has the patient contacted their pharmacy? Yes (Agent: If no, request that the patient contact the pharmacy for the refill. If patient does not wish to contact the pharmacy document the reason why and proceed with request.) (Agent: If yes, when and what did the pharmacy advise?)  This is the patient's preferred pharmacy:  [id number] - [place], [place] - [address] [place] [address] Phone: [phone] Fax: [phone]  Is this the correct pharmacy for this prescription? Yes If no, delete pharmacy and type the correct one.   Has the prescription been filled recently? No  Is the patient out of the medication? Yes  Has the patient been seen for an appointment in the last year OR does the patient have an upcoming appointment? Yes  Can we respond through MyChart? Yes  Agent: Please be advised that Rx refills may take up to 3 business days. We ask that you follow-up with your pharmacy.

## 2023-12-23 MED ORDER — GABAPENTIN 300 MG PO CAPS: ORAL_CAPSULE | ORAL | 5 refills | Status: AC

## 2023-12-23 MED ORDER — BUPRENORPHINE HCL-NALOXONE HCL 8-2 MG SL SUBL: 1.0000 | SUBLINGUAL_TABLET | Freq: Three times a day (TID) | SUBLINGUAL | 0 refills | Status: DC

## 2023-12-26 ENCOUNTER — Other Ambulatory Visit: Payer: Self-pay | Admitting: Student

## 2023-12-26 DIAGNOSIS — F1190 Opioid use, unspecified, uncomplicated: Secondary | ICD-10-CM

## 2023-12-26 MED ORDER — BUPRENORPHINE HCL-NALOXONE HCL 8-2 MG SL SUBL: 1.0000 | SUBLINGUAL_TABLET | Freq: Three times a day (TID) | SUBLINGUAL | 0 refills | Status: AC

## 2023-12-26 MED ORDER — BUPRENORPHINE HCL-NALOXONE HCL 8-2 MG SL SUBL: 1.0000 | SUBLINGUAL_TABLET | Freq: Three times a day (TID) | SUBLINGUAL | 0 refills | Status: DC

## 2023-12-28 NOTE — Progress Notes (Unsigned)
 BH MD/PA/NP OP Progress Note  12/31/2023 3:51 PM Barbara Simon  MRN:  [id number]  Visit Diagnosis:    ICD-10-CM   1. Bipolar 2 disorder (HCC)  F31.81 lamoTRIgine  (LAMICTAL ) 100 MG tablet    ARIPiprazole  (ABILIFY ) 2 MG tablet    venlafaxine  XR (EFFEXOR -XR) 75 MG 24 hr capsule    2. GAD (generalized anxiety disorder)  F41.1 lamoTRIgine  (LAMICTAL ) 100 MG tablet    ARIPiprazole  (ABILIFY ) 2 MG tablet    venlafaxine  XR (EFFEXOR -XR) 75 MG 24 hr capsule       Assessment:  Barbara Simon is a 48 y.o. female with a history of bipolar disorder 2 disorder, opioid use disorder on suboxone  maintenance, psychogenic seizures, GAD, and asthma who presented to [facility] at [facility] for initial evaluation on 07/29/2022.  During initial evaluation patient reported symptoms of insomnia, irritability, mood lability, dysphoria, fatigue, weight loss, amotivation, and passive SI without intent or plan.  Safety planning was reviewed and patient is aware of crisis resources.  She does have a past history of hypomania including decreased sleep and increased energy outside of the context of substance use.  Patient does have a significant past history of substance use including cocaine, opiates, marijuana and benzodiazepines.  She denies any cocaine use and opiate use disorder is stable on Suboxone .  Patient does use marijuana regularly and benzodiazepines infrequently.  Education was provided and patient was recommended to discontinue the benzodiazepine and marijuana use.  Patient also is experiencing seizure-like symptoms including tremors, rigidity, disorientation, and intermittent loss of consciousness without associated tongue biting or loss of bowel/bladder control.  Patient meets criteria for bipolar 2 disorder and generalized anxiety disorder.  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    Barbara Simon presents for  follow-up evaluation. Today, 12/31/23, patient reports ongoing neurovegetative symptoms of depression.  These are related to the continued psychosocial stressors and interpersonal stress with her partner.  There has been some pending improvement in the psychosocial stressors and patient is scheduled to have ostomy bag removed in a month.  There is associated anxiety with the procedure and concern around complications.  Session was cut short due to patient having another doctor's appointment.  Will continue on current regimen and follow-up in 2 months.  Plan: - Continue Lamictal  100 mg daily for bipolar disorder - Continue Venlafaxine  225 mg daily - Continue Abilify  2 mg QD for bipolar disorder - Can restart trazodone  25 mg QHS in the future if finances improve - Continue Suboxone  8-2mg  films every day-BID (patient self tapering) managed by PCP - CMP, CBC, alkaline phosphatase, TSH, T4 reviewed - Continue to follow with neurology - PHP referral, lacks transportation for in person - Therapy resources provided - Crisis resources reviewed - Follow up in 2 months  Chief Complaint:  Chief Complaint  Patient presents with   Follow-up   HPI: Patient's appointment was cut short as she had another doctor's appointment.  Barbara Simon presents reporting that her depression has been ongoing the last 2 months. Her daughter got married 3-4 weeks ago and Barbara Simon was not invited. Patient was hoping her daughter would change her mind as they got closer to the date, but she did not end up doing so.  Barbara Simon is upset that she did not get to take part in this and with the distance between her and her kids.  1 positive patient was able identify is that she had a tentative date  to remove the ostomy bag.  They scheduled the surgery for November and will move forward as long as the test she completes today is all right.  Patient is nervous and scared about the test coming back with something and the other about the  surgery itself. Barbara Simon is worried about the infections she got last time, particularly since her current living environment is not as clean or stable as it had been for the initial surgery.  Had disability doctors appointment last week, which Barbara Simon thinks went ok.   Past Psychiatric History: Information collected from pt, medical record Prior hx bipolar II. Has had hypomanic but not manic episodes outside of periods of substance abuse.  Patient's manic episodes appear to only occurred in context of substance use.  Patient has 2 prior psychiatric hospitalizations in 2005 and 2010 to [facility].  Has tried Lamictal , Depakote, Seroquel , Abilify , Celexa , Lexapro , Cymbalta, Remeron, trazodone , Xanax  Sober on buprenorphine  from opiates. Uses marijuana, home grown. She takes a couple Xanax from her mom when she is overwhelmed. Counseling was provided in regards to both. She denies   Past Medical History:  Past Medical History:  Diagnosis Date   Abnormal Pap smear    LSIL   Anxiety    Asthma    Bipolar 1 disorder (HCC)    Depression    Hepatitis C    two years ago was dx   Ovarian cyst    Seizure (HCC)    Thyrotoxicosis     Past Surgical History:  Procedure Laterality Date   ABDOMINAL SURGERY     APPENDECTOMY     TUBAL LIGATION      Family History:  Family History  Problem Relation Age of Onset   Cancer Mother        bladder   Diabetes Mother    Diabetes Sister    Cerebral palsy Daughter    Breast cancer Maternal Aunt 28 - 69   Cancer Maternal Aunt    Breast cancer Maternal Grandmother    Cancer Maternal Grandmother    Diabetes Brother     Social History:  Social History   Socioeconomic History   Marital status: Married    Spouse name: Not on file   Number of children: Not on file   Years of education: Not on file   Highest education level: Not on file  Occupational History   Not on file  Tobacco Use   Smoking status: Former    Current packs/day: 0.50    Types:  Cigarettes   Smokeless tobacco: Never   Tobacco comments:    1 pack lasts 2 days   Vaping Use   Vaping status: Never Used  Substance and Sexual Activity   Alcohol use: No   Drug use: Yes    Types: Marijuana    Comment: Occasionally.   Sexual activity: Not Currently    Birth control/protection: Surgical    Comment: tubal  Other Topics Concern   Not on file  Social History Narrative   Are you right handed or left handed? right   Are you currently employed ? 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 no   Who lives with you?  family   What type of home do you live in: 1 story or 2 story?  2 story lives up stairs 15-20 steps       Social Drivers of Health   Financial Resource Strain: Medium Risk (05/16/2023)  Received from [facility]   Overall Financial Resource Strain (CARDIA)    Difficulty of Paying Living Expenses: Somewhat hard  Food Insecurity: Food Insecurity Present (05/24/2023)   Hunger Vital Sign    Worried About Running Out of Food in the Last Year: Sometimes true    Ran Out of Food in the Last Year: Often true  Transportation Needs: No Transportation Needs (05/24/2023)   PRAPARE - Administrator, Civil Service (Medical): No    Lack of Transportation (Non-Medical): No  Physical Activity: Insufficiently Active (07/16/2021)   Exercise Vital Sign    Days of Exercise per Week: 2 days    Minutes of Exercise per Session: 20 min  Stress: Stress Concern Present (07/16/2021)   [employer] of Occupational Health - Occupational Stress Questionnaire    Feeling of Stress : Very much  Social Connections: Socially Integrated (07/16/2021)   Social Connection and Isolation Panel    Frequency of Communication with Friends and Family: More than three times a week    Frequency of Social Gatherings with Friends and Family: More than three times a week    Attends Religious Services: More than 4 times per year    Active Member of [employer] or  Organizations: Yes    Attends Banker Meetings: More than 4 times per year    Marital Status: Living with partner    Allergies:  Allergies  Allergen Reactions   Toradol [Ketorolac Tromethamine] Hives and Itching   Adhesive [Tape] Itching and Rash    Current Medications: Current Outpatient Medications  Medication Sig Dispense Refill   acetaminophen  (TYLENOL ) 500 MG tablet Take 500 mg by mouth every 6 (six) hours as needed for moderate pain.     albuterol  (VENTOLIN  HFA) 108 (90 Base) MCG/ACT inhaler Inhale 2 puffs into the lungs every 6 (six) hours as needed for wheezing or shortness of breath. 8 g 3   ARIPiprazole  (ABILIFY ) 2 MG tablet Take 1 tablet (2 mg total) by mouth daily. 90 tablet 0   Aspirin-Acetaminophen -Caffeine (GOODY HEADACHE PO) Take by mouth.     budesonide -formoterol  (SYMBICORT ) 80-4.5 MCG/ACT inhaler Inhale 2 puffs into the lungs in the morning and at bedtime. Also uses as a rescue inhaler every 4 hours as needed for shortness of breath or wheezing 1 each 12   buprenorphine -naloxone  (SUBOXONE ) 8-2 mg SUBL SL tablet Place 1 tablet under the tongue 3 (three) times daily. 90 tablet 0   ferrous sulfate  325 (65 FE) MG tablet Take 1 tablet (325 mg total) by mouth daily with breakfast. Take after you finish your antibiotics 30 tablet 0   fluticasone  (FLONASE ) 50 MCG/ACT nasal spray PLACE 1 SPRAY INTO BOTH NOSTRILS DAILY. SPRAY 1 SPRAY INTO BOTH NOSTRILS DAILY. 16 mL 11   gabapentin  (NEURONTIN ) 300 MG capsule TAKE 3 CAPSULES (900MG  TOTAL) BY MOUTH 3 TIMES DAILY 180 capsule 5   ipratropium (ATROVENT ) 0.02 % nebulizer solution Take 2.5 mLs (0.5 mg total) by nebulization every 6 (six) hours as needed for wheezing or shortness of breath. 125 mL 3   lamoTRIgine  (LAMICTAL ) 100 MG tablet Take 1 tablet (100 mg total) by mouth daily. 90 tablet 0   naloxone  (NARCAN ) nasal spray 4 mg/0.1 mL SMARTSIG:Both Nares     pantoprazole  (PROTONIX ) 40 MG tablet Take 1 tablet (40 mg total) by  mouth daily. 90 tablet 3   polyethylene glycol powder (GLYCOLAX /MIRALAX ) 17 GM/SCOOP powder 1-4 scoops daily or as needed 255 g 11   venlafaxine  XR (EFFEXOR -XR)  75 MG 24 hr capsule Take 3 capsules (225 mg total) by mouth daily with breakfast. 90 capsule 2   No current facility-administered medications for this visit.     Psychiatric Specialty Exam: Review of Systems  There were no vitals taken for this visit.There is no height or weight on file to calculate BMI.  General Appearance: Casual  Eye Contact:  Fair  Speech:  Normal Rate  Volume:  Normal  Mood:  Anxious and Depressed  Affect:  Appropriate and Congruent  Thought Process:  Coherent  Orientation:  Full (Time, Place, and Person)  Thought Content: Logical   Suicidal Thoughts:  Yes.  without intent/plan  Homicidal Thoughts:  No  Memory:  Immediate;   Fair  Judgement:  Fair  Insight:  Fair  Psychomotor Activity:  Decreased  Concentration:  Concentration: Fair  Recall:  [employer] of Knowledge: Fair  Language: Good  Akathisia:  No    AIMS (if indicated): not done  Assets:  Communication Skills Desire for Improvement Housing Intimacy  ADL's:  Intact  Cognition: WNL  Sleep:  Fair   Metabolic Disorder Labs: Lab Results  Component Value Date   HGBA1C 5.0 08/21/2020   No results found for: PROLACTIN No results found for: CHOL, TRIG, HDL, CHOLHDL, VLDL, LDLCALC Lab Results  Component Value Date   TSH 0.940 04/28/2022   TSH 0.646 07/03/2021    Therapeutic Level Labs: No results found for: LITHIUM No results found for: VALPROATE No results found for: CBMZ   Screenings: GAD-7    Flowsheet Row Office Visit from 08/22/2022 in [facility] - A Dept Of [place]. [facility] Office Visit from 07/29/2022 in [facility] Office Visit from 07[facility]16/2021 in [facility] for [facility] at [facility] Office Visit from 07/03/2021 in  [facility] Internal Med [facility] - A Dept Of [place]. [facility] Office Visit from 12/18/2020 in [facility] - A Dept Of [place]. [facility]  Total GAD-7 Score 21 19 20 19 8    PHQ2-9    Flowsheet Row Office Visit from 08/22/2022 in [facility] - A Dept Of [place]. [facility] Office Visit from 07/29/2022 in [facility] Office Visit from 07/22/2022 in [facility] Internal Med [facility]tr - A Dept Of [place]. [facility] Office Visit from 06/20/2022 in [facility] - A Dept Of [employer]. [facility] Office Visit from 04/14/2022 in [facility] - A Dept Of . [facility]  PHQ-2 Total Score 5 6 6 5 4   PHQ-9 Total Score 22 23 24 20 19    Flowsheet Row ED from 08/03/2023 in [facility] Emergency Department at [facility] ED to Hosp-Admission (Discharged) from 05[facility]23/2023 in [facility] Visit [facility]  C-SSRS RISK CATEGORY No Risk No Risk Low Risk    Collaboration of Care: Collaboration of Care: Medication Management AEB medication prescription and Other provider involved in patient's care AEB general surgery, oncology, and ED chart review  Patient/Guardian was advised Release of Information must be obtained prior to any record release in order to collaborate their care with an outside provider. Patient/Guardian was advised if they have not already done so to contact the registration department to sign all necessary forms in order for us  to  release information regarding their care.   Consent: Patient/Guardian gives verbal consent for treatment and assignment of benefits for services provided during this visit. Patient/Guardian expressed understanding and agreed to proceed.    Arvella CHRISTELLA Finder, MD 12/31/2023, 3:51 PM   Virtual Visit via Video  Note  I connected with Ameri Linenberger on 12/31/23 at  3:30 PM EDT by a video enabled telemedicine application and verified that I am speaking with the correct person using two identifiers.  Location: Patient: Home Provider: Home Office   I discussed the limitations of evaluation and management by telemedicine and the availability of in person appointments. The patient expressed understanding and agreed to proceed.   I discussed the assessment and treatment plan with the patient. The patient was provided an opportunity to ask questions and all were answered. The patient agreed with the plan and demonstrated an understanding of the instructions.   The patient was advised to call back or seek an in-person evaluation if the symptoms worsen or if the condition fails to improve as anticipated.  I provided 25 minutes of non-face-to-face time during this encounter.   Arvella CHRISTELLA Finder, MD

## 2023-12-29 ENCOUNTER — Telehealth: Payer: Self-pay

## 2023-12-29 ENCOUNTER — Ambulatory Visit: Admitting: Student

## 2023-12-29 NOTE — Progress Notes (Deleted)
 CC: ***  HPI:  [name] is a 48 y.o. female with a past medical history of asthma, history of treated hepatitis C, bipolar 2 disorder, OUD who presents for follow-up appointment.  Please see assessment and plan for full HPI.  Medications:  Bipolar 2 disorder: Abilify  2 mg daily, Lamictal  100 mg daily, Effexor  225 mg daily Asthma: Atrovent  nebulizer, albuterol  2 puffs every 6 hours as needed, Symbicort  2 puffs twice daily Allergic rhinitis: Flonase  1 spray daily GERD: Protonix  40 mg daily  Opioid use disorder: Suboxone  8 mg - 2 mg 1 tablet 3 times daily, naloxone  spray Neuropathy: Gabapentin : 900 mg 3 times daily Pain: Tylenol  500 mg every 6 hours as needed Iron deficiency: Ferrous sulfate  325 mg daily Constipation: MiraLAX  17 g daily as needed   Has not been seen in the clinic since June 2025.  At that time addressed opioid use disorder.  Patient had missed many appointments because she had a colostomy.  Flu vaccine Pneumonia vaccine  Tox assure at that time showed methamphetamine, Suboxone  THC  Past Medical History:  Diagnosis Date   Abnormal Pap smear    LSIL   Anxiety    Asthma    Bipolar 1 disorder (HCC)    Depression    Hepatitis C    two years ago was dx   Ovarian cyst    Seizure (HCC)    Thyrotoxicosis      Current Outpatient Medications:    acetaminophen  (TYLENOL ) 500 MG tablet, Take 500 mg by mouth every 6 (six) hours as needed for moderate pain., Disp: , Rfl:    albuterol  (VENTOLIN  HFA) 108 (90 Base) MCG/ACT inhaler, Inhale 2 puffs into the lungs every 6 (six) hours as needed for wheezing or shortness of breath., Disp: 8 g, Rfl: 3   ARIPiprazole  (ABILIFY ) 2 MG tablet, Take 1 tablet (2 mg total) by mouth daily., Disp: 90 tablet, Rfl: 0   Aspirin-Acetaminophen -Caffeine (GOODY HEADACHE PO), Take by mouth., Disp: , Rfl:    budesonide -formoterol  (SYMBICORT ) 80-4.5 MCG/ACT inhaler, Inhale 2 puffs into the lungs in the morning and at bedtime. Also uses  as a rescue inhaler every 4 hours as needed for shortness of breath or wheezing, Disp: 1 each, Rfl: 12   buprenorphine -naloxone  (SUBOXONE ) 8-2 mg SUBL SL tablet, Place 1 tablet under the tongue 3 (three) times daily., Disp: 90 tablet, Rfl: 0   ferrous sulfate  325 (65 FE) MG tablet, Take 1 tablet (325 mg total) by mouth daily with breakfast. Take after you finish your antibiotics, Disp: 30 tablet, Rfl: 0   fluticasone  (FLONASE ) 50 MCG/ACT nasal spray, PLACE 1 SPRAY INTO BOTH NOSTRILS DAILY. SPRAY 1 SPRAY INTO BOTH NOSTRILS DAILY., Disp: 16 mL, Rfl: 11   gabapentin  (NEURONTIN ) 300 MG capsule, TAKE 3 CAPSULES (900MG  TOTAL) BY MOUTH 3 TIMES DAILY, Disp: 180 capsule, Rfl: 5   guaiFENesin -dextromethorphan  (ROBITUSSIN DM) 100-10 MG/5ML syrup, Take 5 mLs by mouth every 4 (four) hours as needed for cough., Disp: 118 mL, Rfl: 0   ipratropium (ATROVENT ) 0.02 % nebulizer solution, Take 2.5 mLs (0.5 mg total) by nebulization every 6 (six) hours as needed for wheezing or shortness of breath., Disp: 125 mL, Rfl: 3   lamoTRIgine  (LAMICTAL ) 100 MG tablet, Take 1 tablet (100 mg total) by mouth daily., Disp: 90 tablet, Rfl: 0   methocarbamol  (ROBAXIN ) 500 MG tablet, Take 1 tablet (500 mg total) by mouth 2 (two) times daily as needed for muscle spasms., Disp: 20 tablet, Rfl: 0  naloxone  (NARCAN ) nasal spray 4 mg/0.1 mL, SMARTSIG:Both Nares, Disp: , Rfl:    pantoprazole  (PROTONIX ) 40 MG tablet, Take 1 tablet (40 mg total) by mouth daily., Disp: 90 tablet, Rfl: 3   polyethylene glycol powder (GLYCOLAX /MIRALAX ) 17 GM/SCOOP powder, 1-4 scoops daily or as needed, Disp: 255 g, Rfl: 11   venlafaxine  XR (EFFEXOR -XR) 75 MG 24 hr capsule, Take 3 capsules (225 mg total) by mouth daily with breakfast., Disp: 90 capsule, Rfl: 2  Review of Systems:  ***  Constitutional: Eye: Respiratory: Cardiovascular: GI: MSK: GU: Skin: Neuro: Endocrine:   Physical Exam:  There were no vitals filed for this visit. *** General:  Patient is sitting comfortably in the room  Eyes: Pupils equal and reactive to light, EOM intact  Head: Normocephalic, atraumatic  Neck: Supple, nontender, full range of motion, No JVD Cardio: Regular rate and rhythm, no murmurs, rubs or gallops. 2+ pulses to bilateral upper and lower extremities  Chest: No chest tenderness Pulmonary: Clear to ausculation bilaterally with no rales, rhonchi, and crackles  Abdomen: Soft, nontender with normoactive bowel sounds with no rebound or guarding  Neuro: Alert and orientated x3. CN II-XII intact. Sensation intact to upper and lower extremities. 2+ patellar reflex.  Back: No midline tenderness, no step off or deformities noted. No paraspinal muscle tenderness.  Skin: No rashes noted  MSK: 5/5 strength to upper and lower extremities.    Assessment & Plan:   Assessment & Plan Opioid use disorder      Patient {GC/GE:[id number]::discussed with,seen with} Dr. {WJFZD:[id number]::Hlpoonli,Ynqqfjw,Floozw,Wjmzwimj,Tpoopjfd,Cpwrzwu}  Libby Blanch, DO Internal Medicine Resident PGY-3

## 2023-12-29 NOTE — Telephone Encounter (Signed)
 Please refer to message below.  Copied from CRM [id number]. Topic: Appointments - Scheduling Inquiry for Clinic >> Dec 29, 2023 12:40 PM Barbara Simon wrote: Reason for CRM: patient states she never received a call for her appointment this morning the wrong number was listed Patient updated number I changed it this afternoon, since she missed the call cause the epic had the wrong   Pt num [phone]

## 2023-12-30 ENCOUNTER — Other Ambulatory Visit: Payer: Self-pay | Admitting: Medical Genetics

## 2023-12-30 ENCOUNTER — Ambulatory Visit: Payer: Self-pay

## 2023-12-30 NOTE — Telephone Encounter (Signed)
 FYI Only or Action Required?: Action required by provider: clinical question for provider. Pt missed virtual appt yesterday d/t having wrong phone # on file. Pt reports appt was required in order to have suboxone  and gabapentin  refilled. Reports getting both refilled anyways, but needs to have appt rescheduled. Scheduled for acute visit on 10/27 for thumb pain since August (scheduled further out d/t transportation issues). Can she have both issues addressed at this appt? If not, please call pt to schedule separate appt.  Patient was last seen in primary care on 08/17/2023 by Francella Rogue, MD.  Called Nurse Triage reporting Fall.  Symptoms began several months ago.  Interventions attempted: OTC medications: Goodies powder, ibuprofen  and Other: Wrapped thumb.  Symptoms are: unchanged.  Triage Disposition: See PCP When Office is Open (Within 3 Days)  Patient/caregiver understands and will follow disposition?: No  Copied from CRM #[id number]. Topic: Clinical - Red Word Triage >> Dec 30, 2023  4:30 PM Chiquita SQUIBB wrote: Red Word that prompted transfer to Nurse Triage: Patient is calling in stating that she had a fall and her thumb is very painful. Reason for Disposition  [1] After 3 days AND [2] pain not improved  Answer Assessment - Initial Assessment Questions Pt declines seeing provider in next 3 days d/t transportation issues. Scheduled for soonest appt pt can attend on 10/27.   1. MECHANISM: How did the injury happen?      Was unloading a trailer and stumbled/fel backwards onto left thumb      2. ONSET: When did the injury happen? (e.g., minutes, hours ago)      August  3. LOCATION: What part of the finger is injured? Is the nail damaged?      Left thumb  4. APPEARANCE of the INJURY: What does the injury look like?      Normal  5. SEVERITY: Can you use the hand normally?  Can you bend your fingers into a ball and then fully open them?     Yes  6. SIZE: For cuts, bruises,  or swelling, ask: How large is it? (e.g., inches or centimeters;  entire finger)      None  7. PAIN: Is there pain? If Yes, ask: How bad is the pain?  (Scale 0-10; or none, mild, moderate, severe)     8/10  8. TETANUS: For any breaks in the skin, ask: When was your last tetanus booster?     N/a  9. OTHER SYMPTOMS: Do you have any other symptoms?     No  10. PREGNANCY: Is there any chance you are pregnant? When was your last menstrual period?       No  Protocols used: Finger Injury-A-AH

## 2023-12-31 ENCOUNTER — Telehealth (HOSPITAL_BASED_OUTPATIENT_CLINIC_OR_DEPARTMENT_OTHER): Payer: PRIVATE HEALTH INSURANCE | Admitting: Psychiatry

## 2023-12-31 ENCOUNTER — Encounter (HOSPITAL_COMMUNITY): Payer: Self-pay | Admitting: Psychiatry

## 2023-12-31 DIAGNOSIS — F3181 Bipolar II disorder: Secondary | ICD-10-CM

## 2023-12-31 DIAGNOSIS — K828 Other specified diseases of gallbladder: Secondary | ICD-10-CM | POA: Diagnosis not present

## 2023-12-31 DIAGNOSIS — F411 Generalized anxiety disorder: Secondary | ICD-10-CM | POA: Diagnosis not present

## 2023-12-31 MED ORDER — LAMOTRIGINE 100 MG PO TABS: 100.0000 mg | ORAL_TABLET | Freq: Every day | ORAL | 0 refills | Status: DC

## 2023-12-31 MED ORDER — VENLAFAXINE HCL ER 75 MG PO CP24: 225.0000 mg | ORAL_CAPSULE | Freq: Every day | ORAL | 2 refills | Status: DC

## 2023-12-31 MED ORDER — ARIPIPRAZOLE 2 MG PO TABS: 2.0000 mg | ORAL_TABLET | Freq: Every day | ORAL | 0 refills | Status: DC

## 2023-12-31 NOTE — Telephone Encounter (Signed)
 RTC to patient.  Message left that both issues can be addressed at her appointment on 01/11/2024.

## 2024-01-05 NOTE — Telephone Encounter (Signed)
-------------------------------------------------------------------------------   Summary: WOCN -------------------------------------------------------------------------------  WOCN Consult Services  Telephone Call  Service:WOCN     Reason For Call: - Pouching problem - Ostomy supplies  Assessment:  Pt reports she is still struggling with keeping a pouch on for longer than 1-2 days. She thinks the Convatec system [phone] is working the best so far and is asking for an ew RX be sent to a new DME- [facility] as she continues to have issues with her current supplier.   Actions Taken: -  Requested a new RX be sent to [facility] establishing her a s new patient  Convatec - one-piece system - [id number]- 20 per month  Eakin barrier ring - [id number]- 20 per month  [employer]c (576608) adhesive remover wipe, 50 per month  Hollister Adhesive remover spray (7737), 2 oz per month  Hollister (7300) ostomy belt small, 1 per month  Hollister [phone]) stoma powder, 1 per month  75M (3342) skin barrier wipes, 50 per month    Instructions Provided: -  Please call [phone] if you have not heard from [facility] in a reasonable amount of time.   Follow Up: -  Patient will contact outpatient WOCN for follow-up  Workup Time:  30 minutes  Asberry Palma MSN RN [facility] Consult Services  [phone] [email or web address]    Thank you for your message. I am a [employer] employee and may not check messages daily. Please allow 2-3 days for a reply. If your concern is urgent please call the clinic at [phone]

## 2024-01-10 NOTE — Patient Instructions (Incomplete)
 Thank you, Ms.Imo H Reisz for allowing us  to provide your care today. Today we discussed your left thumb pain.  In regards to your neck pain, look up what is called the suboccipital release techinque online. YouTube has many videos on this that can help relieve some of the muscle tension you have in your neck at home.   You can go to [facility] as a walk-in appoint for an X-ray of your left thumb and wrist.  Address: [address] [place], [place] [address] Phone: [phone]   My Chart Access: [email or web address]  Please follow-up in: 1 month to see how your thumb pain is doing    We look forward to seeing you next time. Please call our clinic at [phone] if you have any questions or concerns. The best time to call is Monday-Friday from 9am-4pm, but there is someone available 24/7. If after hours or the weekend, call the main hospital number and ask for the Internal Medicine Resident On-Call. If you need medication refills, please notify your pharmacy one week in advance and they will send us  a request.   Thank you for letting us  take part in your care. Wishing you the best!  Jashayla Glatfelter, DO 01/11/2024, 11:32 AM [facility]

## 2024-01-10 NOTE — Progress Notes (Unsigned)
   Acute Office Visit  Subjective:     Patient ID: Barbara Simon, female    DOB: [birth year], 48 y.o.   MRN: [id number]  No chief complaint on file.   HPI Barbara Simon is a 48 year old female with a past medical history of anxiety, asthma, and bipolar 1 disorder who presents today for an acute visit regarding her left thumb pain. In August, she was unloading a trailer when she fell backward and landed on her thumb. She is able to use her thumb normally but cites continuous 8/10 pain. Please see problem-based assessment and plan below for details.   ROS      Objective:    There were no vitals taken for this visit.   Physical Exam  No results found for any visits on 01/11/24.      Assessment & Plan:   Patient seen with {IMTSattending2025/2026:32924}.   Problem List Items Addressed This Visit   None   No orders of the defined types were placed in this encounter.  May need to also refill gabapentin  and suboxone  at this encounter as well per gladys note   No follow-ups on file.  Barbara Gora, DO Internal Medicine Resident, PGY-1 11:14 AM 01/10/2024

## 2024-01-11 ENCOUNTER — Ambulatory Visit (INDEPENDENT_AMBULATORY_CARE_PROVIDER_SITE_OTHER): Payer: Self-pay

## 2024-01-11 ENCOUNTER — Ambulatory Visit (HOSPITAL_BASED_OUTPATIENT_CLINIC_OR_DEPARTMENT_OTHER)
Admission: RE | Admit: 2024-01-11 | Discharge: 2024-01-11 | Disposition: A | Discharge status: Home / Self Care | Source: Ambulatory Visit | Attending: Internal Medicine | Admitting: Internal Medicine

## 2024-01-11 ENCOUNTER — Telehealth: Payer: Self-pay | Admitting: *Deleted

## 2024-01-11 VITALS — BP 108/76 | HR 91 | Temp 97.4°F | Ht 63.0 in | Wt 138.8 lb

## 2024-01-11 DIAGNOSIS — M25532 Pain in left wrist: Secondary | ICD-10-CM | POA: Diagnosis not present

## 2024-01-11 DIAGNOSIS — M79645 Pain in left finger(s): Secondary | ICD-10-CM | POA: Insufficient documentation

## 2024-01-11 NOTE — Telephone Encounter (Signed)
 Copied from CRM #[id number]. Topic: Clinical - Request for Lab/Test Order >> Jan 11, 2024 12:55 PM Chiquita SQUIBB wrote: Reason for CRM: Jacques from [facility] is calling in stating the patient is there for an x ray. Patient stated the doctor sent her over from her appointment to get an x ray but the order was not put in. Jacques is asking if this can be put in as soon as possible. His call back number is [phone]

## 2024-01-11 NOTE — Assessment & Plan Note (Addendum)
 Ongoing, developed 1 month ago after [facility] injury. Tenderness to palpation of thenar eminence and snuff box of left hand. Decreased grip strength found on exam. Concerns for potential navicular fracture vs stretch injury of abductor pollicus longus tendon. Conservative treatment with NSAIDs and bracing not working. Patient with difficulty of transportation, needs walk-in appointment for Xray to [facility].   -XR of left wrist, complete -Patient to walk into [facility] for imaging due to transportation concerns.

## 2024-01-13 ENCOUNTER — Ambulatory Visit: Payer: Self-pay

## 2024-01-13 NOTE — Progress Notes (Signed)
 XR of left wrist negative for any fracture. Recommended continued RICE therapy, and if no improvement in 2 weeks to consider referral to orthopedics for further management.

## 2024-01-15 DIAGNOSIS — Z933 Colostomy status: Secondary | ICD-10-CM | POA: Diagnosis not present

## 2024-01-17 ENCOUNTER — Telehealth: Admitting: Physician Assistant

## 2024-01-17 ENCOUNTER — Encounter: Payer: Self-pay | Admitting: Physician Assistant

## 2024-01-17 DIAGNOSIS — Z20822 Contact with and (suspected) exposure to covid-19: Secondary | ICD-10-CM | POA: Diagnosis not present

## 2024-01-17 MED ORDER — COVID-19 FLU A&B 3-IN-1 TEST VI KIT: 1.0000 | PACK | Freq: Once | 0 refills | Status: AC

## 2024-01-17 MED ORDER — IPRATROPIUM BROMIDE 0.03 % NA SOLN: 2.0000 | Freq: Two times a day (BID) | NASAL | 0 refills | Status: AC

## 2024-01-17 MED ORDER — BENZONATATE 100 MG PO CAPS: 100.0000 mg | ORAL_CAPSULE | Freq: Three times a day (TID) | ORAL | 0 refills | Status: AC | PRN

## 2024-01-17 NOTE — Patient Instructions (Signed)
 Barbara Simon, thank you for joining Barbara Velma Lunger, Barbara Simon for today's virtual visit.  While this provider is not your primary care provider (PCP), if your PCP is located in our provider database this encounter information will be shared with them immediately following your visit.   A [facility] MyChart account gives you access to today's visit and all your visits, tests, and labs performed at [facility]  click here if you don't have a [facility] MyChart account or go to [email or web address]  Consent: (Patient) Barbara Simon provided verbal consent for this virtual visit at the beginning of the encounter.  Current Medications:  Current Outpatient Medications:    acetaminophen  (TYLENOL ) 500 MG tablet, Take 500 mg by mouth every 6 (six) hours as needed for moderate pain., Disp: , Rfl:    albuterol  (VENTOLIN  HFA) 108 (90 Base) MCG/ACT inhaler, Inhale 2 puffs into the lungs every 6 (six) hours as needed for wheezing or shortness of breath., Disp: 8 g, Rfl: 3   ARIPiprazole  (ABILIFY ) 2 MG tablet, Take 1 tablet (2 mg total) by mouth daily., Disp: 90 tablet, Rfl: 0   Aspirin-Acetaminophen -Caffeine (GOODY HEADACHE PO), Take by mouth., Disp: , Rfl:    budesonide -formoterol  (SYMBICORT ) 80-4.5 MCG/ACT inhaler, Inhale 2 puffs into the lungs in the morning and at bedtime. Also uses as a rescue inhaler every 4 hours as needed for shortness of breath or wheezing, Disp: 1 each, Rfl: 12   buprenorphine -naloxone  (SUBOXONE ) 8-2 mg SUBL SL tablet, Place 1 tablet under the tongue 3 (three) times daily., Disp: 90 tablet, Rfl: 0   ferrous sulfate  325 (65 FE) MG tablet, Take 1 tablet (325 mg total) by mouth daily with breakfast. Take after you finish your antibiotics, Disp: 30 tablet, Rfl: 0   fluticasone  (FLONASE ) 50 MCG/ACT nasal spray, PLACE 1 SPRAY INTO BOTH NOSTRILS DAILY. SPRAY 1 SPRAY INTO BOTH NOSTRILS DAILY., Disp: 16 mL, Rfl: 11   gabapentin  (NEURONTIN ) 300 MG capsule, TAKE 3  CAPSULES (900MG  TOTAL) BY MOUTH 3 TIMES DAILY, Disp: 180 capsule, Rfl: 5   ipratropium (ATROVENT ) 0.02 % nebulizer solution, Take 2.5 mLs (0.5 mg total) by nebulization every 6 (six) hours as needed for wheezing or shortness of breath., Disp: 125 mL, Rfl: 3   lamoTRIgine  (LAMICTAL ) 100 MG tablet, Take 1 tablet (100 mg total) by mouth daily., Disp: 90 tablet, Rfl: 0   naloxone  (NARCAN ) nasal spray 4 mg/0.1 mL, SMARTSIG:Both Nares, Disp: , Rfl:    pantoprazole  (PROTONIX ) 40 MG tablet, Take 1 tablet (40 mg total) by mouth daily., Disp: 90 tablet, Rfl: 3   polyethylene glycol powder (GLYCOLAX /MIRALAX ) 17 GM/SCOOP powder, 1-4 scoops daily or as needed, Disp: 255 g, Rfl: 11   venlafaxine  XR (EFFEXOR -XR) 75 MG 24 hr capsule, Take 3 capsules (225 mg total) by mouth daily with breakfast., Disp: 90 capsule, Rfl: 2   Medications ordered in this encounter:  No orders of the defined types were placed in this encounter.    *If you need refills on other medications prior to your next appointment, please contact your pharmacy*  Follow-Up: Call back or seek an in-person evaluation if the symptoms worsen or if the condition fails to improve as anticipated.  [facility] [phone]  Other Instructions Please take the home COVID/Flu test as directed. Stay hydrated and try to rest. Take the Tessalon  for cough and keep up with your asthma medications. Start the Atrovent  nasal spray. Ok to alternate Tylenol  and Ibuprofen  as needed. We will talk  again once you have taken you home test.   If you have been instructed to have an in-person evaluation today at a local Urgent Care facility, please use the link below. It will take you to a list of all of our available [facility], including address, phone number and hours of operation. Please do not delay care.  [facility]  If you or a family member do not have a primary care provider, use the link below to schedule a visit  and establish care. When you choose a [facility] primary care physician or advanced practice provider, you gain a long-term partner in health. Find a Primary Care Provider  Learn more about [facility]'s in-office and virtual care options:  - Get Care Now

## 2024-01-17 NOTE — Progress Notes (Signed)
 Virtual Visit Consent   Barbara Simon, you are scheduled for a virtual visit with a [facility] provider today. Just as with appointments in the office, your consent must be obtained to participate. Your consent will be active for this visit and any virtual visit you may have with one of our providers in the next 365 days. If you have a MyChart account, a copy of this consent can be sent to you electronically.  As this is a virtual visit, video technology does not allow for your provider to perform a traditional examination. This may limit your provider's ability to fully assess your condition. If your provider identifies any concerns that need to be evaluated in person or the need to arrange testing (such as labs, EKG, etc.), we will make arrangements to do so. Although advances in technology are sophisticated, we cannot ensure that it will always work on either your end or our end. If the connection with a video visit is poor, the visit may have to be switched to a telephone visit. With either a video or telephone visit, we are not always able to ensure that we have a secure connection.  By engaging in this virtual visit, you consent to the provision of healthcare and authorize for your insurance to be billed (if applicable) for the services provided during this visit. Depending on your insurance coverage, you may receive a charge related to this service.  I need to obtain your verbal consent now. Are you willing to proceed with your visit today? Barbara Simon has provided verbal consent on 01/17/2024 for a virtual visit (video or telephone). Barbara Simon, [place]  Date: 01/17/2024 10:13 AM   Virtual Visit via Video Note   I, Barbara Simon, connected with  Barbara Simon  ([id number], 01-Jul-1975) on 01/17/24 at 10:00 AM EST by a video-enabled telemedicine application and verified that I am speaking with the correct person using two identifiers.  Location: Patient: Virtual Visit  Location Patient: Home Provider: Virtual Visit Location Provider: Home Office   I discussed the limitations of evaluation and management by telemedicine and the availability of in person appointments. The patient expressed understanding and agreed to proceed.    History of Present Illness: Barbara Simon is a 48 y.o. who identifies as a female who was assigned female at birth, and is being seen today for concern about possible COVID-19. Patient endorses symptom onset Friday morning (2 days ago) with nasal and chest congestion, waking up Saturday feeling horrible. Endorses decreased taste and smell with chills and body aches. Some cough. Denies chest pain. Some SOB with exertion only. Cough is productive of green phlegm. Denies recent travel or sick contact.  Has not taken home COVID test.  OTC -- Daytime cold and Flu.  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   CAP (community acquired pneumonia) 05/25/2023   Community acquired pneumonia 05/24/2023   Sepsis (HCC) 05/24/2023   Bipolar 2 disorder (HCC) 07/29/2022   GAD (generalized anxiety disorder) 07/29/2022   Hot flashes 07/28/2022   Irregular menses 07/28/2022   DUB (dysfunctional uterine bleeding) 07/28/2022   Elevated serum creatinine 07/28/2022   Tired 07/28/2022   Viral URI 07/22/2022   Rectocele, Grade 3 04/28/2022   Perimenopause 04/28/2022   Vomiting 04/14/2022   Left lumbar radiculopathy 02/22/2022   Eczema 09/19/2021   Swelling of lower extremity 09/19/2021  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/03/2021   Posterior vaginal wall prolapse 06/14/2021   Healthcare maintenance 05/13/2021   History of colonic polyps 2019 01/08/2021   Asthma 12/24/2020   Tobacco use 11/20/2020   Nasal polyp 11/20/2020   Stress at home 09/25/2020   Bipolar II disorder (HCC) 07/24/2020   Peripheral  neuropathy 07/24/2020   Carpal tunnel syndrome, bilateral 07/24/2020   Opioid use disorder 12/20/2015   Cervical high risk HPV (human papillomavirus) test positive 03/29/2013   History of hepatitis C 02/15/2013   Major depression 02/03/2013    Allergies:  Allergies  Allergen Reactions   Toradol [Ketorolac Tromethamine] Hives and Itching   Adhesive [Tape] Itching and Rash   Medications:  Current Outpatient Medications:    benzonatate  (TESSALON ) 100 MG capsule, Take 1 capsule (100 mg total) by mouth 3 (three) times daily as needed for cough., Disp: 30 capsule, Rfl: 0   Influenza-SARS At Home Test (COVID-19 FLU A&B 3-IN-1 TEST) KIT, 1 kit by In Vitro route once for 1 dose., Disp: 1 kit, Rfl: 0   ipratropium (ATROVENT ) 0.03 % nasal spray, Place 2 sprays into both nostrils every 12 (twelve) hours., Disp: 30 mL, Rfl: 0   acetaminophen  (TYLENOL ) 500 MG tablet, Take 500 mg by mouth every 6 (six) hours as needed for moderate pain., Disp: , Rfl:    albuterol  (VENTOLIN  HFA) 108 (90 Base) MCG/ACT inhaler, Inhale 2 puffs into the lungs every 6 (six) hours as needed for wheezing or shortness of breath., Disp: 8 g, Rfl: 3   ARIPiprazole  (ABILIFY ) 2 MG tablet, Take 1 tablet (2 mg total) by mouth daily., Disp: 90 tablet, Rfl: 0   Aspirin-Acetaminophen -Caffeine (GOODY HEADACHE PO), Take by mouth., Disp: , Rfl:    budesonide -formoterol  (SYMBICORT ) 80-4.5 MCG/ACT inhaler, Inhale 2 puffs into the lungs in the morning and at bedtime. Also uses as a rescue inhaler every 4 hours as needed for shortness of breath or wheezing, Disp: 1 each, Rfl: 12   buprenorphine -naloxone  (SUBOXONE ) 8-2 mg SUBL SL tablet, Place 1 tablet under the tongue 3 (three) times daily., Disp: 90 tablet, Rfl: 0   fluticasone  (FLONASE ) 50 MCG/ACT nasal spray, PLACE 1 SPRAY INTO BOTH NOSTRILS DAILY. SPRAY 1 SPRAY INTO BOTH NOSTRILS DAILY., Disp: 16 mL, Rfl: 11   gabapentin  (NEURONTIN ) 300 MG capsule, TAKE 3 CAPSULES (900MG  TOTAL) BY MOUTH 3  TIMES DAILY, Disp: 180 capsule, Rfl: 5   ipratropium (ATROVENT ) 0.02 % nebulizer solution, Take 2.5 mLs (0.5 mg total) by nebulization every 6 (six) hours as needed for wheezing or shortness of breath., Disp: 125 mL, Rfl: 3   lamoTRIgine  (LAMICTAL ) 100 MG tablet, Take 1 tablet (100 mg total) by mouth daily., Disp: 90 tablet, Rfl: 0   pantoprazole  (PROTONIX ) 40 MG tablet, Take 1 tablet (40 mg total) by mouth daily., Disp: 90 tablet, Rfl: 3   polyethylene glycol powder (GLYCOLAX /MIRALAX ) 17 GM/SCOOP powder, 1-4 scoops daily or as needed, Disp: 255 g, Rfl: 11   venlafaxine  XR (EFFEXOR -XR) 75 MG 24 hr capsule, Take 3 capsules (225 mg total) by mouth daily with breakfast., Disp: 90 capsule, Rfl: 2  Observations/Objective: Patient is well-developed, well-nourished in no acute distress.  Resting comfortably  at home.  Head is normocephalic, atraumatic.  No labored breathing.  Speech is clear and coherent with logical content.  Patient is alert and oriented at baseline.   Assessment and Plan: 1. Suspected COVID-19 virus infection (Primary) - benzonatate  (TESSALON ) 100 MG capsule; Take 1  capsule (100 mg total) by mouth 3 (three) times daily as needed for cough.  Dispense: 30 capsule; Refill: 0 - Influenza-SARS At Home Test (COVID-19 FLU A&B 3-IN-1 TEST) KIT; 1 kit by In Vitro route once for 1 dose.  Dispense: 1 kit; Refill: 0 - ipratropium (ATROVENT ) 0.03 % nasal spray; Place 2 sprays into both nostrils every 12 (twelve) hours.  Dispense: 30 mL; Refill: 0  Will have her test for COVID and flu as a precaution. Suspected COVID but will treat according to results. For now, increase fluids and rest. Will have her start Atrovent  nasal spray and Tessalon  for cough. She is to quarantine until results are in and we discuss next steps. Will add on appropriate antiviral medication based on treatment results and review quarantine.  Follow Up Instructions: I discussed the assessment and treatment plan with the  patient. The patient was provided an opportunity to ask questions and all were answered. The patient agreed with the plan and demonstrated an understanding of the instructions.  A copy of instructions were sent to the patient via MyChart unless otherwise noted below.   The patient was advised to call back or seek an in-person evaluation if the symptoms worsen or if the condition fails to improve as anticipated.    Barbara Velma Lunger, PA-C

## 2024-01-18 MED ORDER — PSEUDOEPH-BROMPHEN-DM 30-2-10 MG/5ML PO SYRP: 5.0000 mL | ORAL_SOLUTION | Freq: Four times a day (QID) | ORAL | 0 refills | Status: DC | PRN

## 2024-01-20 NOTE — Progress Notes (Signed)
Internal Medicine Clinic Attending  I was physically present during the key portions of the resident provided service and participated in the medical decision making of patient's management care. I reviewed pertinent patient test results.  The assessment, diagnosis, and plan were formulated together and I agree with the documentation in the resident's note.  Williams, Julie Anne, MD  

## 2024-02-03 ENCOUNTER — Telehealth: Payer: Self-pay

## 2024-02-03 NOTE — Telephone Encounter (Signed)
 Copied from CRM [id number]. Topic: Clinical - Medication Refill >> Feb 03, 2024 11:35 AM Suzette B wrote: Medication: Buprenorphine  HCl-Naloxone  HCl (SUBOXONE ) 8-2 MG FILM   Has the patient contacted their pharmacy? Yes: patient stated it was necessary to contact the office   This is the patient's preferred pharmacy:  [id number] [place], [place] - [address] [place] [address] Phone: [phone] Fax: [phone]      Is this the correct pharmacy for this prescription? Yes If no, delete pharmacy and type the correct one.   Has the prescription been filled recently? Yes 12/26/23  Is the patient out of the medication? No she has two left just getting out the hospital   Has the patient been seen for an appointment in the last year OR does the patient have an upcoming appointment? Yes  Can we respond through MyChart? Yes  Agent: Please be advised that Rx refills may take up to 3 business days. We ask that you follow-up with your pharmacy.

## 2024-02-04 NOTE — Telephone Encounter (Signed)
Not on current med list, unable to pend.

## 2024-02-05 ENCOUNTER — Ambulatory Visit: Payer: Self-pay

## 2024-02-05 MED ORDER — BUPRENORPHINE HCL-NALOXONE HCL 8-2 MG SL SUBL: 1.0000 | SUBLINGUAL_TABLET | Freq: Every day | SUBLINGUAL | 0 refills | Status: DC

## 2024-02-05 NOTE — Telephone Encounter (Signed)
 Called the hospital operator to page on call Dr. Shawn. Patient would like Suboxone  sent to [facility], sent [facility] today. I sent a secure chat message to Dr. Shawn making her aware of this request.

## 2024-02-05 NOTE — Telephone Encounter (Addendum)
 FYI Only or Action Required?: Action required by provider: suboxone  refill request for different pharmacy. Patient phone number  [phone] Patient was last seen in primary care on 01/17/2024 by Gladis Elsie BROCKS, PA-C.  Called Nurse Triage reporting Medication Problem.    Triage Disposition: Call PCP Now  Patient/caregiver understands and will follow disposition?: Yes  Copied from CRM #[id number]. Topic: Clinical - Prescription Issue >> Feb 05, 2024  5:44 PM Barbara Simon wrote: Reason for CRM: patient calling about medication refill that was sent to her pharmacy and she is asking for the doctor to call it into another pharmacy since her pharmacy doesn't have this available  Buprenorphine  HCl-Naloxone  HCl (SUBOXONE ) 8-2 MG FILM  she states the operator puts in a note for the doctor to call her and she then gets the prescription written and sent to another pharmacy  [phone]- patient phone number Reason for Disposition  [1] Prescription refill request for ESSENTIAL medicine (i.e., likelihood of harm to patient if not taken) AND [2] triager unable to refill per department policy  Answer Assessment - Initial Assessment Questions 1. DRUG NAME: What medicine do you need to have refilled?     subuxone  4. PRESCRIBER: Who prescribed it? Note: The prescribing doctor or group is responsible for refill approvals.Barbara     Simon 5. PHARMACY: Have you contacted your pharmacy (drugstore)? Note: Some pharmacies will contact the doctor (or NP/PA).      Please see updated [facility] in chart   [facility].  [facility] would [facility].   Patient will proceed to ED if unable to get refill.    Unable to successfully page oncall  Protocols used: Medication Refill and Renewal Call-A-AH

## 2024-02-05 NOTE — Telephone Encounter (Signed)
 PDMP reviewed. Suboxone  likely removed due to receiving Oxycodone  after current hospitalization. Refilled Suboxone 

## 2024-02-06 ENCOUNTER — Other Ambulatory Visit: Payer: Self-pay

## 2024-02-06 DIAGNOSIS — R030 Elevated blood-pressure reading, without diagnosis of hypertension: Secondary | ICD-10-CM | POA: Diagnosis not present

## 2024-02-06 DIAGNOSIS — T8140XA Infection following a procedure, unspecified, initial encounter: Secondary | ICD-10-CM | POA: Diagnosis not present

## 2024-02-06 MED ORDER — BUPRENORPHINE HCL-NALOXONE HCL 8-2 MG SL SUBL: 1.0000 | SUBLINGUAL_TABLET | Freq: Every day | SUBLINGUAL | 0 refills | Status: DC

## 2024-02-06 NOTE — Telephone Encounter (Signed)
 Per Dr. Angelene secure chat message, the medication was refilled and sent to [facility] per patient request, patient should be able to pick it up today when the pharmacy opens up.

## 2024-02-08 ENCOUNTER — Telehealth: Payer: Self-pay | Admitting: *Deleted

## 2024-02-08 NOTE — Telephone Encounter (Unsigned)
 Copied from CRM #[id number]. Topic: Clinical - Prescription Issue >> Feb 05, 2024  5:44 PM DeAngela L wrote: Reason for CRM: patient calling about medication refill that was sent to her pharmacy and she is asking for the doctor to call it into another pharmacy since her pharmacy doesn't have this available  Buprenorphine  HCl-Naloxone  HCl (SUBOXONE ) 8-2 MG FILM  she states the operator puts in a note for the doctor to call her and she then gets the prescription written and sent to another pharmacy >> Feb 08, 2024 12:58 PM Debby BROCKS wrote: Patient states that she received only 30 tablets for the medication but was advised that it should have been 90 tablets. She would like to know if this was an error or not

## 2024-02-09 NOTE — Telephone Encounter (Signed)
 Called pt - no answer; left message of office's return call.

## 2024-02-15 ENCOUNTER — Telehealth: Payer: Self-pay | Admitting: *Deleted

## 2024-02-15 NOTE — Telephone Encounter (Signed)
 Please call Pt Barbara Simon regarding her medication:buprenorphine -naloxone  (SUBOXONE ) 8-2 mg SUBL SL tablet

## 2024-02-15 NOTE — Telephone Encounter (Signed)
 Will forward to PCP/Team.                Copied from CRM #[id number]. Topic: Clinical - Prescription Issue >> Feb 15, 2024  3:18 PM Mercer PEDLAR wrote: Reason for CRM: Patient calling regarding prescription for buprenorphine -naloxone  (SUBOXONE ) 8-2 mg SUBL SL tablet. She stated that she has picked up two prescriptions with 30 pills each but she stated that she takes 3 pills a day which makes the 30 pills last for only 10 days. Therefore, she has 60 pills which will last for 20 days and she is requesting another 30 pills to make enough to lat 30 days taking 3 pills a day.   [facility] [phone] - [place], [place] - [address] AT [facility] [address], [place] [address] Phone: [phone]  Fax: [phone]

## 2024-02-16 MED ORDER — BUPRENORPHINE HCL-NALOXONE HCL 8-2 MG SL SUBL: 1.0000 | SUBLINGUAL_TABLET | Freq: Three times a day (TID) | SUBLINGUAL | 0 refills | Status: DC

## 2024-02-23 ENCOUNTER — Encounter (HOSPITAL_COMMUNITY): Payer: Self-pay | Admitting: Psychiatry

## 2024-02-23 ENCOUNTER — Telehealth (HOSPITAL_COMMUNITY): Payer: PRIVATE HEALTH INSURANCE | Admitting: Psychiatry

## 2024-02-23 DIAGNOSIS — F3181 Bipolar II disorder: Secondary | ICD-10-CM

## 2024-02-23 DIAGNOSIS — F411 Generalized anxiety disorder: Secondary | ICD-10-CM

## 2024-02-23 MED ORDER — ARIPIPRAZOLE 2 MG PO TABS: 2.0000 mg | ORAL_TABLET | Freq: Every day | ORAL | 0 refills | Status: AC

## 2024-02-23 MED ORDER — LAMOTRIGINE 100 MG PO TABS: 100.0000 mg | ORAL_TABLET | Freq: Every day | ORAL | 0 refills | Status: AC

## 2024-02-23 MED ORDER — VENLAFAXINE HCL ER 75 MG PO CP24: 225.0000 mg | ORAL_CAPSULE | Freq: Every day | ORAL | 2 refills | Status: AC

## 2024-02-23 NOTE — Progress Notes (Signed)
 BH MD/PA/NP OP Progress Note  02/23/2024 4:33 PM Barbara Simon  MRN:  [id number]  Visit Diagnosis:    ICD-10-CM   1. Bipolar 2 disorder (HCC)  F31.81 lamoTRIgine  (LAMICTAL ) 100 MG tablet    ARIPiprazole  (ABILIFY ) 2 MG tablet    venlafaxine  XR (EFFEXOR -XR) 75 MG 24 hr capsule    2. GAD (generalized anxiety disorder)  F41.1 lamoTRIgine  (LAMICTAL ) 100 MG tablet    ARIPiprazole  (ABILIFY ) 2 MG tablet    venlafaxine  XR (EFFEXOR -XR) 75 MG 24 hr capsule     Assessment:  Barbara Simon is a 48 y.o. female with a history of bipolar disorder 2 disorder, opioid use disorder on suboxone  maintenance, psychogenic seizures, GAD, and asthma who presented to [facility] for initial evaluation on 07/29/2022.  During initial evaluation patient reported symptoms of insomnia, irritability, mood lability, dysphoria, fatigue, weight loss, amotivation, and passive SI without intent or plan.  Safety planning was reviewed and patient is aware of crisis resources.  She does have a past history of hypomania including decreased sleep and increased energy outside of the context of substance use.  Patient does have a significant past history of substance use including cocaine, opiates, marijuana and benzodiazepines.  She denies any cocaine use and opiate use disorder is stable on Suboxone .  Patient does use marijuana regularly and benzodiazepines infrequently.  Education was provided and patient was recommended to discontinue the benzodiazepine and marijuana use.  Patient also is experiencing seizure-like symptoms including tremors, rigidity, disorientation, and intermittent loss of consciousness without associated tongue biting or loss of bowel/bladder control.  Patient meets criteria for bipolar 2 disorder and generalized anxiety disorder.  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    Barbara Simon presents for follow-up  evaluation. Today, 02/23/24, patient endorses neurovegetative symptoms of depression.  While psychosocial situation has begun to gradually improve patient received a negative outcome following her recent surgery.  She was not able to have the ostomy bag removed and there is potential that she will have it for the rest of her life.  Patient is still processing the grief related to this revelation.  She is taking her medications consistently denies adverse side effects.  She denies SI and does not feel medication adjustments would be beneficial at this time.  Patient would benefit from increased support including therapy and PHP.  This is limited by finances and transportation.  Can reconsider in the future as these improve.  Will follow-up in 6 weeks  Psychotherapeutic interventions were used during today's session. From 4:05 PM to 4:30 PM. Therapeutic interventions included empathic listening, supportive therapy, cognitive and behavioral therapy, motivational interviewing. Used supportive interviewing techniques to provide emotional validation. Worked on cognitive reframing techniques and unhelpful thoughts challenged as appropriate. Alternative thoughts developed with guidance. Reviewed some techniques to facilitate increased behavioral activation. Improvement was evidenced by patient's participation and identified commitment to therapy goals.   Plan: - Continue Lamictal  100 mg daily for bipolar disorder - Continue Venlafaxine  225 mg daily - Continue Abilify  2 mg QD for bipolar disorder - Can restart trazodone  25 mg QHS in the future if finances improve - Continue Suboxone  8-2mg  films every day-BID (patient self tapering) managed by PCP - CMP, CBC, alkaline phosphatase, TSH, T4 reviewed - Continue to follow with neurology - PHP referral, lacks transportation for in person - Therapy resources provided - Crisis resources reviewed - Follow up in 6 weeks  Chief  Complaint:  Chief Complaint  Patient  presents with   Follow-up   HPI: Barbara Simon presents reporting that she is trying to survive and not doing to good. She had the surgery in October and they were unable to remove the ostomy bag. She is really down that this was not taken care of and gets upset whenever she thinks about it. The idea of dealing with this for the rest of her life is really difficult. Feels let down as she reports being promised that she would be put back together. Then went through the surgery and all the pain for nothing. She wants to get a second opinion but is uncertain if that is something that can be done. Empathic listening techniques were used and support was provided. Worked on cognitive re framing to identify that she is not any less because of it.   Still in a camper, this one is a bit bigger, more space to move around. Her husband got a job which has been good. Some difficulty around the holidays as they shut down for Christmas and Thanksgiving.  Despite that Barbara Simon does identify that things are looking up some as far as they are living situation and finances go.  They even were able to recently get a car and are working on getting it tagged.  Patient reports that she was denied for her disability claim and is going to appeal it.  Still looking into this for her mental health and ongoing medical issues.  She is uncertain if she can work given that she gets the hand tremors and stuttering every time she gets upset.  Patient is taking her medications consistently and denies adverse side effects.  She is uncertain of the full benefit but thinks little will help given the current life situation.  Did recommend PHP and therapy options though transportation and finance limit this.  As they get a car back patient will reconsider PHP.  Past Psychiatric History: Information collected from pt, medical record Prior hx bipolar II. Has had hypomanic but not manic episodes outside of periods of substance abuse.  Patient's  manic episodes appear to only occurred in context of substance use.  Patient has 2 prior psychiatric hospitalizations in 2005 and 2010 to [facility].  Has tried Lamictal , Depakote, Seroquel , Abilify , Celexa , Lexapro , Cymbalta, Remeron, trazodone , Xanax  Sober on buprenorphine  from opiates. Uses marijuana, home grown. She takes a couple Xanax from her mom when she is overwhelmed. Counseling was provided in regards to both. She denies   Past Medical History:  Past Medical History:  Diagnosis Date   Abnormal Pap smear    LSIL   Anxiety    Asthma    Bipolar 1 disorder (HCC)    Depression    Hepatitis C    two years ago was dx   Ovarian cyst    Seizure (HCC)    Thyrotoxicosis     Past Surgical History:  Procedure Laterality Date   ABDOMINAL SURGERY     APPENDECTOMY     TUBAL LIGATION      Family History:  Family History  Problem Relation Age of Onset   Cancer Mother        bladder   Diabetes Mother    Diabetes Sister    Cerebral palsy Daughter    Breast cancer Maternal Aunt 66 - 57   Cancer Maternal Aunt    Breast cancer Maternal Grandmother    Cancer Maternal Grandmother    Diabetes Brother  Social History:  Social History   Socioeconomic History   Marital status: Married    Spouse name: Not on file   Number of children: Not on file   Years of education: Not on file   Highest education level: Not on file  Occupational History   Not on file  Tobacco Use   Smoking status: Former    Current packs/day: 0.50    Types: Cigarettes   Smokeless tobacco: Never   Tobacco comments:    1 pack lasts 2 days   Vaping Use   Vaping status: Never Used  Substance and Sexual Activity   Alcohol use: No   Drug use: Yes    Types: Marijuana    Comment: Occasionally.   Sexual activity: Not Currently    Birth control/protection: Surgical    Comment: tubal  Other Topics Concern   Not on file  Social History Narrative   Are you right handed or left handed? right   Are you  currently employed ? 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 no   Who lives with you?  family   What type of home do you live in: 1 story or 2 story?  2 story lives up stairs 15-20 steps       Social Drivers of Health   Financial Resource Strain: High Risk (01/24/2024)   Received from [facility]   Overall Financial Resource Strain (CARDIA)    How hard is it for you to pay for the very basics like food, housing, medical care, and heating?: Hard  Food Insecurity: Food Insecurity Present (01/24/2024)   [facility] from [facility]   Hunger Vital Sign    Within the past 12 months, you worried that your food would run out before you got the money to buy more.: Often true    Within the past 12 months, the food you bought just didn't last and you didn't have money to get more.: Often true  Transportation Needs: Unmet Transportation Needs (01/24/2024)   Received from [facility] - Transportation    Lack of Transportation (Medical): Yes    Lack of Transportation (Non-Medical): Yes  Physical Activity: Insufficiently Active (07/16/2021)   Exercise Vital Sign    Days of Exercise per Week: 2 days    Minutes of Exercise per Session: 20 min  Stress: Stress Concern Present (07/16/2021)   [employer] of Occupational Health - Occupational Stress Questionnaire    Feeling of Stress : Very much  Social Connections: Socially Integrated (07/16/2021)   Social Connection and Isolation Panel    Frequency of Communication with Friends and Family: More than three times a week    Frequency of Social Gatherings with Friends and Family: More than three times a week    Attends Religious Services: More than 4 times per year    Active Member of [employer] or Organizations: Yes    Attends Banker Meetings: More than 4 times per year    Marital Status: Living with partner    Allergies:  Allergies  Allergen Reactions   Toradol [Ketorolac  Tromethamine] Hives and Itching   Adhesive [Tape] Itching and Rash    Current Medications: Current Outpatient Medications  Medication Sig Dispense Refill   acetaminophen  (TYLENOL ) 500 MG tablet Take 500 mg by mouth every 6 (six) hours as needed for moderate pain.     albuterol  (VENTOLIN  HFA) 108 (90 Base) MCG/ACT inhaler  Inhale 2 puffs into the lungs every 6 (six) hours as needed for wheezing or shortness of breath. 8 g 3   ARIPiprazole  (ABILIFY ) 2 MG tablet Take 1 tablet (2 mg total) by mouth daily. 90 tablet 0   Aspirin-Acetaminophen -Caffeine (GOODY HEADACHE PO) Take by mouth.     benzonatate  (TESSALON ) 100 MG capsule Take 1 capsule (100 mg total) by mouth 3 (three) times daily as needed for cough. 30 capsule 0   brompheniramine-pseudoephedrine-DM 30-2-10 MG/5ML syrup Take 5 mLs by mouth 4 (four) times daily as needed. 120 mL 0   budesonide -formoterol  (SYMBICORT ) 80-4.5 MCG/ACT inhaler Inhale 2 puffs into the lungs in the morning and at bedtime. Also uses as a rescue inhaler every 4 hours as needed for shortness of breath or wheezing 1 each 12   buprenorphine -naloxone  (SUBOXONE ) 8-2 mg SUBL SL tablet Place 1 tablet under the tongue 3 (three) times daily. 90 tablet 0   fluticasone  (FLONASE ) 50 MCG/ACT nasal spray PLACE 1 SPRAY INTO BOTH NOSTRILS DAILY. SPRAY 1 SPRAY INTO BOTH NOSTRILS DAILY. 16 mL 11   gabapentin  (NEURONTIN ) 300 MG capsule TAKE 3 CAPSULES (900MG  TOTAL) BY MOUTH 3 TIMES DAILY 180 capsule 5   ipratropium (ATROVENT ) 0.02 % nebulizer solution Take 2.5 mLs (0.5 mg total) by nebulization every 6 (six) hours as needed for wheezing or shortness of breath. 125 mL 3   ipratropium (ATROVENT ) 0.03 % nasal spray Place 2 sprays into both nostrils every 12 (twelve) hours. 30 mL 0   lamoTRIgine  (LAMICTAL ) 100 MG tablet Take 1 tablet (100 mg total) by mouth daily. 90 tablet 0   pantoprazole  (PROTONIX ) 40 MG tablet Take 1 tablet (40 mg total) by mouth daily. 90 tablet 3   polyethylene glycol  powder (GLYCOLAX /MIRALAX ) 17 GM/SCOOP powder 1-4 scoops daily or as needed 255 g 11   venlafaxine  XR (EFFEXOR -XR) 75 MG 24 hr capsule Take 3 capsules (225 mg total) by mouth daily with breakfast. 90 capsule 2   No current facility-administered medications for this visit.     Psychiatric Specialty Exam: Review of Systems  There were no vitals taken for this visit.There is no height or weight on file to calculate BMI.  General Appearance: Casual  Eye Contact:  Fair  Speech:  Normal Rate  Volume:  Normal  Mood:  Anxious and Depressed  Affect:  Appropriate and Congruent  Thought Process:  Coherent  Orientation:  Full (Time, Place, and Person)  Thought Content: Logical   Suicidal Thoughts:  Yes.  without intent/plan  Homicidal Thoughts:  No  Memory:  Immediate;   Fair  Judgement:  Fair  Insight:  Fair  Psychomotor Activity:  Decreased  Concentration:  Concentration: Fair  Recall:  [employer] of Knowledge: Fair  Language: Good  Akathisia:  No    AIMS (if indicated): not done  Assets:  Communication Skills Desire for Improvement Housing Intimacy  ADL's:  Intact  Cognition: WNL  Sleep:  Fair   Metabolic Disorder Labs: Lab Results  Component Value Date   HGBA1C 5.0 08/21/2020   No results found for: PROLACTIN No results found for: CHOL, TRIG, HDL, CHOLHDL, VLDL, LDLCALC Lab Results  Component Value Date   TSH 0.940 04/28/2022   TSH 0.646 07/03/2021    Therapeutic Level Labs: No results found for: LITHIUM No results found for: VALPROATE No results found for: CBMZ   Screenings: GAD-7    Flowsheet Row Office Visit from 08/22/2022 in [facility] - A Dept Of [place].  [facility] Office Visit from 07/29/2022 in [facility] Visit from 07/16/2021 in [facility] for [facility] Office Visit from 07/03/2021 in [facility] - A Dept Of [place]  [facility] Office Visit from 12/18/2020 in [facility] - A Dept Of [place]. [facility]  Total GAD-7 Score 21 19 20 19 8    PHQ2-9    Flowsheet Row Office Visit from 01/11/2024 in [facility] - A Dept Of [place]. [facility] Office Visit from 08/22/2022 in [facility] - A Dept Of [place]. [facility] Office Visit from 07/29/2022 in [facility] Office Visit from 07/22/2022 in [facility] - A Dept Of [place]. [facility] Office Visit from 06/20/2022 in [facility] - A Dept Of [place]. [facility]  PHQ-2 Total Score 4 5 6 6 5   PHQ-9 Total Score 18 22 23 24 20    Flowsheet Row ED from 08/03/2023 in [facility] at [facility] to Hosp-Admission (Discharged) from 05/23/2023 in [facility]  [facility]  C-SSRS RISK CATEGORY No Risk No Risk Low Risk    Collaboration of Care: Collaboration of Care: Medication Management AEB medication prescription and Other provider involved in patient's care AEB general surgery, hospital, and ED chart review  Patient/Guardian was advised Release of Information must be obtained prior to any record release in order to collaborate their care with an outside provider. Patient/Guardian was advised if they have not already done so to contact the registration department to sign all necessary forms in order for us  to release information regarding their care.   Consent: Patient/Guardian gives verbal consent for treatment and assignment of benefits for services provided during this visit. Patient/Guardian expressed understanding and agreed to proceed.    Barbara CHRISTELLA Finder, MD 02/23/2024, 4:33 PM   Virtual Visit via Video Note  I connected with Barbara Simon on  02/23/24 at  4:00 PM EST by a video enabled telemedicine application and verified that I am speaking with the correct person using two identifiers.  Location: Patient: Home Provider: Home Office   I discussed the limitations of evaluation and management by telemedicine and the availability of in person appointments. The patient expressed understanding and agreed to proceed.   I discussed the assessment and treatment plan with the patient. The patient was provided an opportunity to ask questions and all were answered. The patient agreed with the plan and demonstrated an understanding of the instructions.   The patient was advised to call back or seek an in-person evaluation if the symptoms worsen or if the condition fails to improve as anticipated.  I provided 25 minutes of non-face-to-face time during this encounter.   Barbara CHRISTELLA Finder, MD

## 2024-03-18 ENCOUNTER — Telehealth: Payer: Self-pay | Admitting: Neurology

## 2024-03-18 NOTE — Telephone Encounter (Signed)
 Pt called in this afternoon and  she stated that her seizures has gotten really bad. Pt would like a call back.  Thanks

## 2024-03-18 NOTE — Telephone Encounter (Signed)
 Pt c/o: seizure Missed medications?  No. Sleep deprived?  No. She said if she sits down she goes to sleep  Alcohol intake?  No. Increased stress? Yes.   Any trigger?  No they just happen  Any change in medication color or shape? No. Back to their usual baseline self?  No.. If no, advise go to ER Current medications prescribed by Dr. Georjean:  lamoTRIgine  (LAMICTAL ) 100 MG tablet Take 1 tablet (100 mg total) by mouth daily ( pt said she was taken it but didn't know it was a seizure medication)  Pt stated that her seizures have been bad the past couple of days  Pt was advised that she needs to go to the ER because she is having back to back  seizures, she was asking if she could se another DR in the office that isnt so backed up she was informed that DR Georjean is the seizure Dr,  She then asked who could treat her neuropathy pt was advised that she called about her Seizure we needed to take care of this and that she needed to have her husband take her to the ER to be evaluated and for this 1st before worrying about another provider, Pt stated that she would have her husband take her to the hospital, even though last time she went they told her she was not having seizures because she could talk while moving her head,

## 2024-03-25 ENCOUNTER — Ambulatory Visit: Payer: Self-pay | Admitting: Student

## 2024-03-28 NOTE — Progress Notes (Unsigned)
 BH MD/PA/NP OP Progress Note  03/31/2024 2:13 PM Barbara Simon  MRN:  [id number]  Visit Diagnosis:    ICD-10-CM   1. Bipolar 2 disorder (HCC)  F31.81     2. GAD (generalized anxiety disorder)  F41.1 busPIRone  (BUSPAR ) 10 MG tablet      Assessment:  Barbara Simon is a 49 y.o. female with a history of bipolar disorder 2 disorder, opioid use disorder on suboxone  maintenance, psychogenic seizures, GAD, and asthma who presented to [facility] at [facility] for initial evaluation on 07/29/2022.  During initial evaluation patient reported symptoms of insomnia, irritability, mood lability, dysphoria, fatigue, weight loss, amotivation, and passive SI without intent or plan.  Safety planning was reviewed and patient is aware of crisis resources.  She does have a past history of hypomania including decreased sleep and increased energy outside of the context of substance use.  Patient does have a significant past history of substance use including cocaine, opiates, marijuana and benzodiazepines.  She denies any cocaine use and opiate use disorder is stable on Suboxone .  Patient does use marijuana regularly and benzodiazepines infrequently.  Education was provided and patient was recommended to discontinue the benzodiazepine and marijuana use.  Patient also is experiencing seizure-like symptoms including tremors, rigidity, disorientation, and intermittent loss of consciousness without associated tongue biting or loss of bowel/bladder control.  Patient meets criteria for bipolar 2 disorder and generalized anxiety disorder.  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    Barbara Simon presents for follow-up evaluation. Today, 03/31/24, patient is experiencing ongoing symptoms of depression including amotivation, anhedonia, fatigue, excessive sedation, negative self thoughts, and increased isolation.  These are related to the  ongoing psychosocial stressors and somatic stressors.  There has been a reoccurrence of seizure symptoms with increased frequency over the past week.  Patient has reach out to the neurologist though does not have an appointment upcoming until July.  She is taking medications consistently and denies adverse side effects.  We will start BuSpar  as adjunct treatment for anxiety symptoms.  Will continue on remainder of current regimen and follow-up in a month.  Psychotherapeutic interventions were used during today's session. From 1:34 PM to 2:01 PM. Therapeutic interventions included empathic listening, supportive therapy, cognitive and behavioral therapy, motivational interviewing. Used supportive interviewing techniques to provide emotional validation. Worked on cognitive reframing techniques and unhelpful thoughts challenged as appropriate. Alternative thoughts developed with guidance. Reviewed some techniques to facilitate increased behavioral activation and the adverse effect increased isolation has on depression. Improvement was evidenced by patient's participation and identified commitment to therapy goals.   Plan: - Continue Lamictal  100 mg daily for bipolar disorder - Continue Venlafaxine  225 mg daily - Start BuSpar  10 mg BID - Continue Abilify  2 mg QD for bipolar disorder - Can restart trazodone  25 mg QHS in the future if finances improve - Continue Suboxone  8-2mg  films every day-BID (patient self tapering) managed by PCP - CMP, CBC, alkaline phosphatase, TSH, T4 reviewed - Continue to follow with neurology - PHP referral, lacks transportation for in person - Therapy resources provided - Crisis resources reviewed - Follow up in a month  Chief Complaint:  Chief Complaint  Patient presents with   Follow-up   HPI: Barbara Simon presents reporting that things are not too good.  Still struggling with significant depressive symptoms in part related to the ongoing psychosocial stressors.  While her  husband did get a job there is  still significant financial stressors and they are still living in a camper.  Her husband also has the ongoing court case and they recently found out that he has a warrant having missed a court appearance.  Patient is frustrated reporting that the lawyer had not informed him that a court appearance was upcoming.  Now she is terrified that her husband will be arrested and she does not have any ability to bail him out.  Without him present she is afraid that she will waste away as she has no source of income.  In addition to the psychosocial stressors of the ongoing somatic concerns.  Patient still expresses significant frustration that she did not have the ostomy bag removed.  She reports being upset that this was never raised as a possibility prior to it occurring.  With the ostomy bag she still having trouble with it fitting and falling off.  Empathic listening techniques were used and support was provided.  Patient has also been having a flareup of her carpal tunnel and arthritis.  These have been an issue in the past and improved after injections a couple years ago.  She is reaching out to the doctor to have these evaluated again.  Her seizures have started up again a month ago and the last week has been a lot worse. Last week she spent the day in bed. She reached out to the neurologist who is not able to see them until July. She denies any trigger or increase in stress that could have led to the symptoms getting worse. In the past seizures were diagnosed as psychogenic.   Thought this going on Barbara Simon reports feeling constantly depressed and has been isolating more.  She is only leaving the camper to play with her dog and otherwise spends most of her time inside.  Patient has also stopped reaching out to family and grandkids.  We reviewed how increased isolation leads to increased depression and used motivational interviewing to encourage behavioral activation.  Discussed  medication options and recommended starting on BuSpar  as an adjunct to help manage anxiety symptoms.  Risk and benefits were reviewed.  Past Psychiatric History: Information collected from pt, medical record Prior hx bipolar II. Has had hypomanic but not manic episodes outside of periods of substance abuse.  Patient's manic episodes appear to only occurred in context of substance use.  Patient has 2 prior psychiatric hospitalizations in 2005 and 2010 to [facility].  Has tried Lamictal , Depakote, Seroquel , Abilify , Celexa , Lexapro , Cymbalta, Remeron, trazodone , Xanax  Sober on buprenorphine  from opiates. Uses marijuana, home grown. She takes a couple Xanax from her mom when she is overwhelmed. Relapsed on Meth in 2025. Counseling was provided in regards to both. She denies   Past Medical History:  Past Medical History:  Diagnosis Date   Abnormal Pap smear    LSIL   Anxiety    Asthma    Bipolar 1 disorder (HCC)    Depression    Hepatitis C    two years ago was dx   Ovarian cyst    Seizure (HCC)    Thyrotoxicosis     Past Surgical History:  Procedure Laterality Date   ABDOMINAL SURGERY     APPENDECTOMY     TUBAL LIGATION      Family History:  Family History  Problem Relation Age of Onset   Cancer Mother        bladder   Diabetes Mother    Diabetes Sister    Cerebral palsy Daughter  Breast cancer Maternal Aunt 30 - 39   Cancer Maternal Aunt    Breast cancer Maternal Grandmother    Cancer Maternal Grandmother    Diabetes Brother     Social History:  Social History   Socioeconomic History   Marital status: Married    Spouse name: Not on file   Number of children: Not on file   Years of education: Not on file   Highest education level: Not on file  Occupational History   Not on file  Tobacco Use   Smoking status: Former    Current packs/day: 0.50    Types: Cigarettes   Smokeless tobacco: Never   Tobacco comments:    1 pack lasts 2 days   Vaping Use   Vaping  status: Never Used  Substance and Sexual Activity   Alcohol use: No   Drug use: Yes    Types: Marijuana    Comment: Occasionally.   Sexual activity: Not Currently    Birth control/protection: Surgical    Comment: tubal  Other Topics Concern   Not on file  Social History Narrative   Are you right handed or left handed? right   Are you currently employed ? 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 no   Who lives with you?  family   What type of home do you live in: 1 story or 2 story?  2 story lives up stairs 15-20 steps       Social Drivers of Health   Tobacco Use: Medium Risk (03/31/2024)   Patient History    Smoking Tobacco Use: Former    Smokeless Tobacco Use: Never    Passive Exposure: Not on file  Financial Resource Strain: High Risk (01/24/2024)   Received from [facility]   Overall Financial Resource Strain (CARDIA)    How hard is it for you to pay for the very basics like food, housing, medical care, and heating?: Hard  Food Insecurity: Food Insecurity Present (01/24/2024)   [facility] from [facility]   Epic    Within the past 12 months, you worried that your food would run out before you got the money to buy more.: Often true    Within the past 12 months, the food you bought just didn't last and you didn't have money to get more.: Often true  Transportation Needs: Unmet Transportation Needs (01/24/2024)   Received from [facility] - Transportation    Lack of Transportation (Medical): Yes    Lack of Transportation (Non-Medical): Yes  Physical Activity: Insufficiently Active (07/16/2021)   Exercise Vital Sign    Days of Exercise per Week: 2 days    Minutes of Exercise per Session: 20 min  Stress: Stress Concern Present (07/16/2021)   [employer] of Occupational Health - Occupational Stress Questionnaire    Feeling of Stress : Very much  Social Connections: Socially Integrated (07/16/2021)   Social Connection and  Isolation Panel    Frequency of Communication with Friends and Family: More than three times a week    Frequency of Social Gatherings with Friends and Family: More than three times a week    Attends Religious Services: More than 4 times per year    Active Member of Clubs or Organizations: Yes    Attends Banker Meetings: More than 4 times per year    Marital Status: Living with partner  Depression (PHQ2-9): High Risk (01/11/2024)  Depression (PHQ2-9)    PHQ-2 Score: 18  Alcohol Screen: Low Risk (07/16/2021)   Alcohol Screen    Last Alcohol Screening Score (AUDIT): 0  Housing: Low Risk (05/24/2023)   Housing Stability Vital Sign    Unable to Pay for Housing in the Last Year: No    Number of Times Moved in the Last Year: 0    Homeless in the Last Year: No  Utilities: Not At Risk (05/24/2023)   AHC Utilities    Threatened with loss of utilities: No  Health Literacy: Not on file    Allergies:  Allergies  Allergen Reactions   Toradol [Ketorolac Tromethamine] Hives and Itching   Adhesive [Tape] Itching and Rash    Current Medications: Current Outpatient Medications  Medication Sig Dispense Refill   busPIRone  (BUSPAR ) 10 MG tablet Take 1 tablet (10 mg total) by mouth 2 (two) times daily. 60 tablet 2   acetaminophen  (TYLENOL ) 500 MG tablet Take 500 mg by mouth every 6 (six) hours as needed for moderate pain.     albuterol  (VENTOLIN  HFA) 108 (90 Base) MCG/ACT inhaler Inhale 2 puffs into the lungs every 6 (six) hours as needed for wheezing or shortness of breath. 8 g 3   ARIPiprazole  (ABILIFY ) 2 MG tablet Take 1 tablet (2 mg total) by mouth daily. 90 tablet 0   Aspirin-Acetaminophen -Caffeine (GOODY HEADACHE PO) Take by mouth.     benzonatate  (TESSALON ) 100 MG capsule Take 1 capsule (100 mg total) by mouth 3 (three) times daily as needed for cough. 30 capsule 0   brompheniramine-pseudoephedrine-DM 30-2-10 MG/5ML syrup Take 5 mLs by mouth 4 (four) times daily as needed. 120 mL 0    budesonide -formoterol  (SYMBICORT ) 80-4.5 MCG/ACT inhaler Inhale 2 puffs into the lungs in the morning and at bedtime. Also uses as a rescue inhaler every 4 hours as needed for shortness of breath or wheezing 1 each 12   buprenorphine -naloxone  (SUBOXONE ) 8-2 mg SUBL SL tablet Place 1 tablet under the tongue 3 (three) times daily. 90 tablet 0   fluticasone  (FLONASE ) 50 MCG/ACT nasal spray PLACE 1 SPRAY INTO BOTH NOSTRILS DAILY. SPRAY 1 SPRAY INTO BOTH NOSTRILS DAILY. 16 mL 11   gabapentin  (NEURONTIN ) 300 MG capsule TAKE 3 CAPSULES (900MG  TOTAL) BY MOUTH 3 TIMES DAILY 180 capsule 5   ipratropium (ATROVENT ) 0.02 % nebulizer solution Take 2.5 mLs (0.5 mg total) by nebulization every 6 (six) hours as needed for wheezing or shortness of breath. 125 mL 3   ipratropium (ATROVENT ) 0.03 % nasal spray Place 2 sprays into both nostrils every 12 (twelve) hours. 30 mL 0   lamoTRIgine  (LAMICTAL ) 100 MG tablet Take 1 tablet (100 mg total) by mouth daily. 90 tablet 0   pantoprazole  (PROTONIX ) 40 MG tablet Take 1 tablet (40 mg total) by mouth daily. 90 tablet 3   polyethylene glycol powder (GLYCOLAX /MIRALAX ) 17 GM/SCOOP powder 1-4 scoops daily or as needed 255 g 11   venlafaxine  XR (EFFEXOR -XR) 75 MG 24 hr capsule Take 3 capsules (225 mg total) by mouth daily with breakfast. 90 capsule 2   No current facility-administered medications for this visit.     Psychiatric Specialty Exam: Review of Systems  There were no vitals taken for this visit.There is no height or weight on file to calculate BMI.  General Appearance: Casual  Eye Contact:  Fair  Speech:  Normal Rate  Volume:  Normal  Mood:  Anxious and Depressed  Affect:  Appropriate and Congruent  Thought Process:  Coherent  Orientation:  Full (Time, Place, and Person)  Thought Content: Logical   Suicidal Thoughts:  Yes.  without intent/plan  Homicidal Thoughts:  No  Memory:  Immediate;   Fair  Judgement:  Fair  Insight:  Fair  Psychomotor Activity:   Decreased  Concentration:  Concentration: Fair  Recall:  [employer] of Knowledge: Fair  Language: Good  Akathisia:  No    AIMS (if indicated): not done  Assets:  Communication Skills Desire for Improvement Housing Intimacy  ADL's:  Intact  Cognition: WNL  Sleep:  Fair   Metabolic Disorder Labs: Lab Results  Component Value Date   HGBA1C 5.0 08/21/2020   No results found for: PROLACTIN No results found for: CHOL, TRIG, HDL, CHOLHDL, VLDL, LDLCALC Lab Results  Component Value Date   TSH 0.940 04/28/2022   TSH 0.646 07/03/2021    Therapeutic Level Labs: No results found for: LITHIUM No results found for: VALPROATE No results found for: CBMZ   Screenings: GAD-7    Flowsheet Row Office Visit from 08/22/2022 in [facility] - A Dept Of [place]. [facility] Office Visit from 07/29/2022 in [facility] Visit from 07/16/2021 in [facility] Office Visit from 07/03/2021 in [facility] - A Dept Of [place]. [facility] Office Visit from 12/18/2020 in [facility] - A Dept Of [facility]. [facility]  Total GAD-7 Score 21 19 20 19 8    PHQ2-9    Flowsheet Row Office Visit from 01/11/2024 in [facility] Med [facility] - A Dept Of [place]. [facility] Office Visit from 08/22/2022 in [facility] - A Dept Of [place]. [facility] Office Visit from 07/29/2022 in [facility] Office Visit from 07/22/2022 in [facility] - A Dept Of [place]. [facility] Office Visit from 06/20/2022 in [facility] - A Dept Of . [facility]  PHQ-2 Total Score 4 5 6 6 5   PHQ-9 Total Score 18 22 23 24 20    Flowsheet Row ED from 08/03/2023 in [facility] Department at [facility] ED to  Hosp-Admission (Discharged) from 05/23/2023 in [facility]  C-SSRS RISK CATEGORY No Risk No Risk Low Risk    Collaboration of Care: Collaboration of Care: Medication Management AEB medication prescription and Other provider involved in patient's care AEB general surgery, hospital, and ED chart review  Patient/Guardian was advised Release of Information must be obtained prior to any record release in order to collaborate their care with an outside provider. Patient/Guardian was advised if they have not already done so to contact the registration department to sign all necessary forms in order for us  to release information regarding their care.   Consent: Patient/Guardian gives verbal consent for treatment and assignment of benefits for services provided during this visit. Patient/Guardian expressed understanding and agreed to proceed.    Arvella CHRISTELLA Finder, MD 03/31/2024, 2:13 PM   Virtual Visit via Video Note  I connected with Barbara Simon on 03/31/24 at  1:30 PM EST by a video enabled telemedicine application and verified that I am speaking with the correct person using two identifiers.  Location: Patient: Home Provider: Home Office   I discussed the limitations of  evaluation and management by telemedicine and the availability of in person appointments. The patient expressed understanding and agreed to proceed.   I discussed the assessment and treatment plan with the patient. The patient was provided an opportunity to ask questions and all were answered. The patient agreed with the plan and demonstrated an understanding of the instructions.   The patient was advised to call back or seek an in-person evaluation if the symptoms worsen or if the condition fails to improve as anticipated.  I provided 25 minutes of non-face-to-face time during this encounter.   Arvella CHRISTELLA Finder,  MD

## 2024-03-31 ENCOUNTER — Encounter (HOSPITAL_COMMUNITY): Payer: Self-pay | Admitting: Psychiatry

## 2024-03-31 ENCOUNTER — Telehealth (HOSPITAL_BASED_OUTPATIENT_CLINIC_OR_DEPARTMENT_OTHER): Payer: PRIVATE HEALTH INSURANCE | Admitting: Psychiatry

## 2024-03-31 DIAGNOSIS — F411 Generalized anxiety disorder: Secondary | ICD-10-CM | POA: Diagnosis not present

## 2024-03-31 DIAGNOSIS — F3181 Bipolar II disorder: Secondary | ICD-10-CM

## 2024-03-31 MED ORDER — BUSPIRONE HCL 10 MG PO TABS: 10.0000 mg | ORAL_TABLET | Freq: Two times a day (BID) | ORAL | 2 refills | Status: AC

## 2024-04-01 ENCOUNTER — Ambulatory Visit: Admitting: Student

## 2024-04-01 NOTE — Progress Notes (Unsigned)
" ° °  Established Patient Office Visit  Subjective   Patient ID: Barbara Simon, female    DOB: June 05, 1975  Age: 49 y.o. MRN: [id number]  No chief complaint on file.   Barbara Simon is a 49 y.o. who presents to the clinic for ***. Please see problem based assessment and plan for additional details.   Patient Active Problem List   Diagnosis Date Noted   Pain of left thumb 01/11/2024   Colostomy care (HCC) 08/17/2023   CAP (community acquired pneumonia) 05/25/2023   Community acquired pneumonia 05/24/2023   Sepsis (HCC) 05/24/2023   Bipolar 2 disorder (HCC) 07/29/2022   GAD (generalized anxiety disorder) 07/29/2022   Hot flashes 07/28/2022   Irregular menses 07/28/2022   DUB (dysfunctional uterine bleeding) 07/28/2022   Elevated serum creatinine 07/28/2022   Tired 07/28/2022   Viral URI 07/22/2022   Rectocele, Grade 3 04/28/2022   Perimenopause 04/28/2022   Vomiting 04/14/2022   Left lumbar radiculopathy 02/22/2022   Eczema 09/19/2021   Swelling of lower extremity 09/19/2021   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/03/2021   Posterior vaginal wall prolapse 06/14/2021   Healthcare maintenance 05/13/2021   History of colonic polyps 2019 01/08/2021   Asthma 12/24/2020   Tobacco use 11/20/2020   Nasal polyp 11/20/2020   Stress at home 09/25/2020   Bipolar II disorder (HCC) 07/24/2020   Peripheral neuropathy 07/24/2020   Carpal tunnel syndrome, bilateral 07/24/2020   Opioid use disorder 12/20/2015   Cervical high risk HPV (human papillomavirus) test positive 03/29/2013   History of hepatitis C 02/15/2013   Major depression 02/03/2013      Objective:     There were no vitals taken for this visit. BP Readings from Last 3 Encounters:  01/11/24 108/76  08/17/23 103/72  08/03/23 95/66   Wt Readings from Last 3 Encounters:  01/11/24 138 lb 12.8 oz (63 kg)  08/17/23 139 lb 12.8 oz (63.4 kg)  08/03/23 140 lb (63.5 kg)       Physical Exam   No results found for any visits on 04/01/24.  Last metabolic panel Lab Results  Component Value Date   GLUCOSE 107 (H) 08/03/2023   NA 139 08/03/2023   K 4.0 08/03/2023   CL 105 08/03/2023   CO2 23 08/03/2023   BUN 14 08/03/2023   CREATININE 0.90 08/03/2023   GFRNONAA >60 08/03/2023   CALCIUM 8.9 08/03/2023   PHOS 3.6 07/03/2021   PROT 6.5 08/03/2023   ALBUMIN 3.2 (L) 08/03/2023   LABGLOB 1.8 04/28/2022   AGRATIO 2.4 (H) 04/28/2022   BILITOT 0.2 08/03/2023   ALKPHOS 98 08/03/2023   AST 17 08/03/2023   ALT 14 08/03/2023   ANIONGAP 7 08/03/2023   Last hemoglobin A1c Lab Results  Component Value Date   HGBA1C 5.0 08/21/2020      The ASCVD Risk score (Arnett DK, et al., 2019) failed to calculate for the following reasons:   Cannot find a previous HDL lab   Cannot find a previous total cholesterol lab   * - Cholesterol units were assumed    Assessment & Plan:   Problem List Items Addressed This Visit   None   No follow-ups on file.    Damien Lease, DO  "

## 2024-04-05 ENCOUNTER — Other Ambulatory Visit: Payer: Self-pay | Admitting: Medical Genetics

## 2024-04-05 DIAGNOSIS — Z006 Encounter for examination for normal comparison and control in clinical research program: Secondary | ICD-10-CM

## 2024-04-14 ENCOUNTER — Ambulatory Visit: Admitting: Student

## 2024-04-14 NOTE — Progress Notes (Unsigned)
" ° °  Established Patient Office Visit  Subjective   Patient ID: Barbara Simon, female    DOB: September 11, 1975  Age: 49 y.o. MRN: [id number]  No chief complaint on file.   Barbara Simon is a 49 y.o. who presents to the clinic for ***. Please see problem based assessment and plan for additional details.   Patient Active Problem List   Diagnosis Date Noted   Pain of left thumb 01/11/2024   Colostomy care (HCC) 08/17/2023   CAP (community acquired pneumonia) 05/25/2023   Community acquired pneumonia 05/24/2023   Sepsis (HCC) 05/24/2023   Bipolar 2 disorder (HCC) 07/29/2022   GAD (generalized anxiety disorder) 07/29/2022   Hot flashes 07/28/2022   Irregular menses 07/28/2022   DUB (dysfunctional uterine bleeding) 07/28/2022   Elevated serum creatinine 07/28/2022   Tired 07/28/2022   Viral URI 07/22/2022   Rectocele, Grade 3 04/28/2022   Perimenopause 04/28/2022   Vomiting 04/14/2022   Left lumbar radiculopathy 02/22/2022   Eczema 09/19/2021   Swelling of lower extremity 09/19/2021   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/03/2021   Posterior vaginal wall prolapse 06/14/2021   Healthcare maintenance 05/13/2021   History of colonic polyps 2019 01/08/2021   Asthma 12/24/2020   Tobacco use 11/20/2020   Nasal polyp 11/20/2020   Stress at home 09/25/2020   Bipolar II disorder (HCC) 07/24/2020   Peripheral neuropathy 07/24/2020   Carpal tunnel syndrome, bilateral 07/24/2020   Opioid use disorder 12/20/2015   Cervical high risk HPV (human papillomavirus) test positive 03/29/2013   History of hepatitis C 02/15/2013   Major depression 02/03/2013      Objective:     There were no vitals taken for this visit. BP Readings from Last 3 Encounters:  01/11/24 108/76  08/17/23 103/72  08/03/23 95/66   Wt Readings from Last 3 Encounters:  01/11/24 138 lb 12.8 oz (63 kg)  08/17/23 139 lb 12.8 oz (63.4 kg)  08/03/23 140 lb (63.5 kg)       Physical Exam   No results found for any visits on 04/14/24.  Last metabolic panel Lab Results  Component Value Date   GLUCOSE 107 (H) 08/03/2023   NA 139 08/03/2023   K 4.0 08/03/2023   CL 105 08/03/2023   CO2 23 08/03/2023   BUN 14 08/03/2023   CREATININE 0.90 08/03/2023   GFRNONAA >60 08/03/2023   CALCIUM 8.9 08/03/2023   PHOS 3.6 07/03/2021   PROT 6.5 08/03/2023   ALBUMIN 3.2 (L) 08/03/2023   LABGLOB 1.8 04/28/2022   AGRATIO 2.4 (H) 04/28/2022   BILITOT 0.2 08/03/2023   ALKPHOS 98 08/03/2023   AST 17 08/03/2023   ALT 14 08/03/2023   ANIONGAP 7 08/03/2023   Last hemoglobin A1c Lab Results  Component Value Date   HGBA1C 5.0 08/21/2020      The ASCVD Risk score (Arnett DK, et al., 2019) failed to calculate for the following reasons:   Cannot find a previous HDL lab   Cannot find a previous total cholesterol lab   * - Cholesterol units were assumed    Assessment & Plan:   Problem List Items Addressed This Visit   None   No follow-ups on file.    Damien Lease, DO  "

## 2024-04-21 ENCOUNTER — Ambulatory Visit: Admitting: Student

## 2024-04-21 ENCOUNTER — Telehealth: Payer: Self-pay | Admitting: *Deleted

## 2024-04-21 VITALS — BP 114/84 | HR 87 | Temp 97.7°F | Ht 63.0 in | Wt 145.2 lb

## 2024-04-21 DIAGNOSIS — G5603 Carpal tunnel syndrome, bilateral upper limbs: Secondary | ICD-10-CM

## 2024-04-21 DIAGNOSIS — J339 Nasal polyp, unspecified: Secondary | ICD-10-CM

## 2024-04-21 DIAGNOSIS — R0982 Postnasal drip: Secondary | ICD-10-CM | POA: Insufficient documentation

## 2024-04-21 DIAGNOSIS — B029 Zoster without complications: Secondary | ICD-10-CM | POA: Insufficient documentation

## 2024-04-21 DIAGNOSIS — F1190 Opioid use, unspecified, uncomplicated: Secondary | ICD-10-CM

## 2024-04-21 LAB — GLUCOSE, CAPILLARY: Glucose-Capillary: 127 mg/dL — ABNORMAL HIGH (ref 70–99)

## 2024-04-21 MED ORDER — VALACYCLOVIR HCL 1 G PO TABS: 1000.0000 mg | ORAL_TABLET | Freq: Three times a day (TID) | ORAL | 0 refills | Status: AC

## 2024-04-21 MED ORDER — BUPRENORPHINE HCL-NALOXONE HCL 8-2 MG SL SUBL: 1.0000 | SUBLINGUAL_TABLET | Freq: Three times a day (TID) | SUBLINGUAL | 0 refills | Status: DC

## 2024-04-21 MED ORDER — FLUTICASONE PROPIONATE 50 MCG/ACT NA SUSP: 1.0000 | Freq: Every day | NASAL | 11 refills | Status: AC

## 2024-04-21 NOTE — Telephone Encounter (Signed)
 Called pt - stated she has an appt this afternoon 2/5 at 1445 PM for med refills and referrals (ENT and for carpal tunnel).

## 2024-04-21 NOTE — Assessment & Plan Note (Addendum)
 Reports post nasal drip, sometimes clear vs thick and chunky phelgm,  Temp dry and hot > feels more congested Reports breathing better when in cold  +HA, Reporst sinus pain pressures  - Reports taht her throat closes up  Reports that she snores Horse voice.  Has tried Claritin and Allegra without any relief  PE: Red nasal turbinates, dry anterior nasal cavity. +cobblestone pattern in the parynx and erythema.  Taking oral decongestatensts - Referral to ENT  - intranasal ipratropium bromide ?

## 2024-04-21 NOTE — Assessment & Plan Note (Addendum)
 Patient presents today with concerns of severe distress due to bilateral hand pain in the setting of bilateral carpal tunnel syndrome. NCS-EMG 05/09/2021 that showed bilateral median neuropathy at or distal to the wrist consistent with mild in degree. Reports that she had steriod injection once in the past, which made her pain worse. Patient describes her pain as sharp shooting pain up her shoulder, numbness below the elbow.  Reports she is unable to grasp objects and numbness is worse during the cold weather.  Currently taking gabapentin  900 mg 3 times daily.  Reports that she would like to see surgery for decompression. -Referral to orthopedic surgery for evaluate for decompression

## 2024-04-21 NOTE — Assessment & Plan Note (Addendum)
 Tox assure on 08/17/2023 showed expected buprenorphine  and venlafaxine , unexpected methamphetamine and THC. Home medication consist of Suboxone  8-2 mg, 3 films TID; reports that she has been out of Suboxone  for the past 3 days, reports feeling tired.  PDMP reviewed, last refill on 03/14/2024.  Patient does report that she has been taking a lot of pseudoephedrine over-the-counter, cough and cold medicines, Tylenol  cold and sinus medication. -Refill for Suboxone  sent to pharmacy - Urine tox screen today, RTC in 3 months

## 2024-04-21 NOTE — Assessment & Plan Note (Addendum)
 Patient reports chronic history of nasal congestion, intermittent productive cough with phlegm production, difficulty breathing from her nostrils when she has nasal congestion.  Patient reports that sometimes she has clear versus thick chunky phlegm production, she feels more congested during hot and dry temperature.  Reports associated symptoms of headaches, sinus pressures.  Reports sometimes she feels like her throat is closing up and has difficulty breathing.  CT head imaging on 08/03/2023 showed mucosal thickening of the right maxillary sinus with small hyperdense air-fluid level.  Reports that she has tried over-the-counter decongestants without relief. PE is notable for erythematous nasal turbinates, dry anterior nasal cavity; and cobblestone pattern and erythema of the pharynx is noted.  - Referral to ENT is placed - Flonase  and nasal saline irrigation recommended

## 2024-04-21 NOTE — Progress Notes (Unsigned)
 "  Established Patient Office Visit  Subjective   Patient ID: Barbara Simon, female    DOB: 05/23/1975  Age: 49 y.o. MRN: [id number]  Chief Complaint  Patient presents with   Medication Refill   Rash    On the buttock area.    Nasal Congestion     Requesting referral to ENT. Pt states thick mucus inside the throat.    Carpal tunnel and tendonitis in both hands.     [name] is a 49 y.o. 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  Review of Systems:  As per assessment and Plan   Objective:     Vitals:   04/21/24 1440  BP: 114/84  Pulse: 87  Temp: 97.7 F (36.5 C)  TempSrc: Oral  SpO2: 96%  Weight: 145 lb 3.2 oz (65.9 kg)  Height: 5' 3 (1.6 m)    Physical Exam General: Sitting in chair, no acute distress Nose: erythematous nasal turbinates, dry anterior nasal cavity.  Mouth: cobblestone pattern and erythema of the pharynx  Cardiovascular: Regular rate Pulmonary: Breathing comfortably Abdomen: Soft, nontender, nondistended MSK: No lower extremity edema bilaterally Skin: Blistering noted along the R buttock, TTP [picture in chart media]    Assessment & Plan:   Patient discussed with Dr. Jeanelle Assessment & Plan Carpal tunnel syndrome, bilateral Patient presents today with concerns of severe distress due to bilateral hand pain in the setting of bilateral carpal tunnel syndrome. NCS-EMG 05/09/2021 that showed bilateral median neuropathy at or distal to the wrist consistent with mild in  degree. Reports that she had steriod injection once in the past, which made her pain worse. Patient describes her pain as sharp shooting pain up her shoulder, numbness below the elbow.  Reports she is unable to grasp objects and numbness is worse during the cold weather.  Currently taking gabapentin  900 mg 3 times daily.  Reports that she would like to see surgery for decompression. -Referral to orthopedic surgery for evaluate for decompression  Opioid use disorder Tox assure on 08/17/2023 showed expected buprenorphine  and venlafaxine , unexpected methamphetamine and THC. Home medication consist of Suboxone  8-2 mg, 3 films TID; reports that she has been out of Suboxone  for the past 3 days, reports feeling tired.  PDMP reviewed, last refill on 03/14/2024.  Patient does report that she has been taking a lot of pseudoephedrine over-the-counter, cough and cold medicines, Tylenol  cold and sinus medication. -Refill for Suboxone  sent to pharmacy - Urine tox screen today, RTC in 3 months  Nasal polyp Reports post nasal drip, sometimes clear vs thick and chunky phelgm,  Temp dry and hot > feels more congested Reports breathing better when in cold  +HA, Reporst sinus pain pressures  - Reports taht her throat closes up  Reports that she snores Horse voice.  Has tried Claritin and Allegra without any relief  PE: Red nasal turbinates, dry anterior nasal cavity. +cobblestone pattern in the parynx and erythema.  Taking oral decongestatensts - Referral to ENT  - intranasal ipratropium bromide ?  Post-nasal drip Patient reports chronic history of nasal congestion, intermittent productive cough with phlegm production, difficulty breathing  from her nostrils when she has nasal congestion.  Patient reports that sometimes she has clear versus thick chunky phlegm production, she feels more congested during hot and dry temperature.  Reports associated symptoms of headaches, sinus pressures.  Reports sometimes she feels  like her throat is closing up and has difficulty breathing.  CT head imaging on 08/03/2023 showed mucosal thickening of the right maxillary sinus with small hyperdense air-fluid level.  Reports that she has tried over-the-counter decongestants without relief. PE is notable for erythematous nasal turbinates, dry anterior nasal cavity; and cobblestone pattern and erythema of the pharynx is noted.  - Referral to ENT is placed - Flonase  and nasal saline irrigation recommended Herpes zoster without complication Presents with symptom onset last week.  Reports that she noticed painful blisters on her right buttock that is itching and has sharp tingling sensation.  Denies any fever or chills.  Reports that she has tried over-the-counter hydrocortisone  cream without relief.  Patient also reports that she had prior history of rashes in the past that she had evaluated that were not consistent with shingles.  Physical exam is notable for multiple vesicular lesions in a cluster along the right buttock that does not cross the midline.  Differential diagnosis includes herpes zoster versus herpes simplex.  Will treat with valacyclovir  1 g 3 times daily for 5 days; if the rash reappears, then likely herpes simplex and will need maintenance therapy.   Problem List Items Addressed This Visit     Opioid use disorder (Chronic)   Tox assure on 08/17/2023 showed expected buprenorphine  and venlafaxine , unexpected methamphetamine and THC. Home medication consist of Suboxone  8-2 mg, 3 films TID; reports that she has been out of Suboxone  for the past 3 days, reports feeling tired.  PDMP reviewed, last refill on 03/14/2024.  Patient does report that she has been taking a lot of pseudoephedrine over-the-counter, cough and cold medicines, Tylenol  cold and sinus medication. -Refill for Suboxone  sent to pharmacy - Urine tox screen today, RTC in 3 months       Relevant Medications   buprenorphine -naloxone  (SUBOXONE ) 8-2 mg SUBL SL  tablet   Other Relevant Orders   ToxAssure Select,+Antidepr,UR   Carpal tunnel syndrome, bilateral - Primary   Patient presents today with concerns of severe distress due to bilateral hand pain in the setting of bilateral carpal tunnel syndrome. NCS-EMG 05/09/2021 that showed bilateral median neuropathy at or distal to the wrist consistent with mild in degree. Reports that she had steriod injection once in the past, which made her pain worse. Patient describes her pain as sharp shooting pain up her shoulder, numbness below the elbow.  Reports she is unable to grasp objects and numbness is worse during the cold weather.  Currently taking gabapentin  900 mg 3 times daily.  Reports that she would like to see surgery for decompression. -Referral to orthopedic surgery for evaluate for decompression       Relevant Orders   Ambulatory referral to Orthopedic Surgery   Nasal polyp   Reports post nasal drip, sometimes clear vs thick and chunky phelgm,  Temp dry and hot > feels more congested Reports breathing better when in cold  +HA, Reporst sinus pain pressures  - Reports taht her throat closes up  Reports that she snores Horse voice.  Has tried Claritin and Allegra without any relief  PE: Red nasal turbinates, dry anterior nasal cavity. +cobblestone pattern in the parynx and erythema.  Taking oral decongestatensts - Referral to ENT  -  intranasal ipratropium bromide ?       Relevant Medications   fluticasone  (FLONASE ) 50 MCG/ACT nasal spray   Other Relevant Orders   Ambulatory referral to ENT   Post-nasal drip   Patient reports chronic history of nasal congestion, intermittent productive cough with phlegm production, difficulty breathing from her nostrils when she has nasal congestion.  Patient reports that sometimes she has clear versus thick chunky phlegm production, she feels more congested during hot and dry temperature.  Reports associated symptoms of headaches, sinus pressures.  Reports  sometimes she feels like her throat is closing up and has difficulty breathing.  CT head imaging on 08/03/2023 showed mucosal thickening of the right maxillary sinus with small hyperdense air-fluid level.  Reports that she has tried over-the-counter decongestants without relief.      Relevant Medications   fluticasone  (FLONASE ) 50 MCG/ACT nasal spray   Other Relevant Orders   Ambulatory referral to ENT   Herpes zoster without complication   Relevant Medications   valACYclovir  (VALTREX ) 1000 MG tablet    No follow-ups on file.    Toma Edwards, DO "

## 2024-04-21 NOTE — Assessment & Plan Note (Signed)
 Presents with symptom onset last week.  Reports that she noticed painful blisters on her right buttock that is itching and has sharp tingling sensation.  Denies any fever or chills.  Reports that she has tried over-the-counter hydrocortisone  cream without relief.  Patient also reports that she had prior history of rashes in the past that she had evaluated that were not consistent with shingles.  Physical exam is notable for multiple vesicular lesions in a cluster along the right buttock that does not cross the midline.  Differential diagnosis includes herpes zoster versus herpes simplex.  Will treat with valacyclovir  1 g 3 times daily for 5 days; if the rash reappears, then likely herpes simplex and will need maintenance therapy.

## 2024-04-21 NOTE — Patient Instructions (Signed)
 Thank you, [name] for allowing us  to provide your care today. Today we discussed:   I have sent referrals to surgery for your carpal tunnel syndrome and referral to ENT.  Please pick up your medication from the pharmacy.  I will call you for your labs.  I have ordered the following labs for you:  Lab Orders         ToxAssure Select,+Antidepr,UR      Referrals ordered today:   Referral Orders         Ambulatory referral to Orthopedic Surgery         Ambulatory referral to ENT      I have ordered the following medication/changed the following medications:   Stop the following medications: Medications Discontinued During This Encounter  Medication Reason   brompheniramine-pseudoephedrine-DM 30-2-10 MG/5ML syrup    buprenorphine -naloxone  (SUBOXONE ) 8-2 mg SUBL SL tablet Reorder   fluticasone  (FLONASE ) 50 MCG/ACT nasal spray Reorder     Start the following medications: Meds ordered this encounter  Medications   valACYclovir  (VALTREX ) 1000 MG tablet    Sig: Take 1 tablet (1,000 mg total) by mouth 3 (three) times daily for 5 days.    Dispense:  15 tablet    Refill:  0   buprenorphine -naloxone  (SUBOXONE ) 8-2 mg SUBL SL tablet    Sig: Place 1 tablet under the tongue 3 (three) times daily.    Dispense:  90 tablet    Refill:  0   fluticasone  (FLONASE ) 50 MCG/ACT nasal spray    Sig: Place 1 spray into both nostrils daily. SPRAY 1 SPRAY INTO BOTH NOSTRILS DAILY.    Dispense:  16 mL    Refill:  11     Follow up: 3 months routine visit     Remember: Should you have any questions or concerns please call the internal medicine clinic at [phone].    Dr. Toma [facility]

## 2024-04-21 NOTE — Telephone Encounter (Signed)
 Copied from CRM #[id number]. Topic: Clinical - Refused Triage >> Apr 21, 2024  9:41 AM Farrel B wrote: Patient/caller voiced complaints of Pt refused NT because she stated they would send her to the hospital and she's needing to see the pcp . Declined transfer to triage.  If patient is unestablished, route message to [facility] Nurse Triage If patient is established, route message to the appropriate department clinical pool

## 2024-04-21 NOTE — Progress Notes (Signed)
 Internal Medicine Clinic Attending  Case discussed with the resident at the time of the visit.  We reviewed the resident's history and exam and pertinent patient test results.  I agree with the assessment, diagnosis, and plan of care documented in the resident's note.

## 2024-04-22 ENCOUNTER — Other Ambulatory Visit: Payer: Self-pay | Admitting: Student

## 2024-04-22 ENCOUNTER — Telehealth: Payer: Self-pay | Admitting: *Deleted

## 2024-04-22 DIAGNOSIS — F1190 Opioid use, unspecified, uncomplicated: Secondary | ICD-10-CM

## 2024-04-22 MED ORDER — BUPRENORPHINE HCL-NALOXONE HCL 8-2 MG SL SUBL: 1.0000 | SUBLINGUAL_TABLET | Freq: Three times a day (TID) | SUBLINGUAL | 0 refills | Status: AC

## 2024-04-22 NOTE — Telephone Encounter (Unsigned)
 Copied from CRM [id number]. Topic: Clinical - Prescription Issue >> Apr 22, 2024 12:07 PM DeAngela L wrote: Reason for CRM: patient calling after speaking with the pharmacy and was told the doctor who put in her prescriptions yesterday can't complete the request and another doctor needs to sign off on the refills   [facility] [phone] - [place], [place] - [address] AT [facility] [address], [place] [address] Phone: [phone]  Fax: [phone]  Patient num [phone]

## 2024-04-26 ENCOUNTER — Telehealth (HOSPITAL_COMMUNITY): Admitting: Psychiatry

## 2024-09-26 ENCOUNTER — Ambulatory Visit: Payer: Self-pay | Admitting: Neurology
